# Patient Record
Sex: Female | Born: 1960 | Race: Black or African American | Hispanic: No | Marital: Single | Smoking: Never smoker
Health system: Southern US, Community
[De-identification: ages and names within clinical notes are randomized; demographics above are authoritative.]

## PROBLEM LIST (undated history)

## (undated) DIAGNOSIS — R590 Localized enlarged lymph nodes: Secondary | ICD-10-CM

## (undated) DIAGNOSIS — I10 Essential (primary) hypertension: Secondary | ICD-10-CM

## (undated) DIAGNOSIS — D649 Anemia, unspecified: Secondary | ICD-10-CM

## (undated) DIAGNOSIS — C539 Malignant neoplasm of cervix uteri, unspecified: Secondary | ICD-10-CM

## (undated) DIAGNOSIS — R87619 Unspecified abnormal cytological findings in specimens from cervix uteri: Secondary | ICD-10-CM

## (undated) DIAGNOSIS — E66813 Obesity, class 3: Secondary | ICD-10-CM

## (undated) DIAGNOSIS — D61818 Other pancytopenia: Secondary | ICD-10-CM

## (undated) DIAGNOSIS — Z973 Presence of spectacles and contact lenses: Secondary | ICD-10-CM

## (undated) DIAGNOSIS — R918 Other nonspecific abnormal finding of lung field: Secondary | ICD-10-CM

## (undated) HISTORY — DX: Morbid (severe) obesity due to excess calories: E66.01

## (undated) HISTORY — DX: Essential (primary) hypertension: I10

## (undated) HISTORY — DX: Unspecified abnormal cytological findings in specimens from cervix uteri: R87.619

## (undated) HISTORY — DX: Malignant neoplasm of cervix uteri, unspecified: C53.9

## (undated) HISTORY — PX: CERVICAL BIOPSY: SHX590

## (undated) HISTORY — DX: Obesity, class 3: E66.813

---

## 2000-06-03 ENCOUNTER — Encounter: Payer: Self-pay | Admitting: Emergency Medicine

## 2000-06-03 ENCOUNTER — Emergency Department (HOSPITAL_COMMUNITY): Admission: EM | Admit: 2000-06-03 | Discharge: 2000-06-04 | Payer: Self-pay | Admitting: Emergency Medicine

## 2000-09-18 ENCOUNTER — Emergency Department (HOSPITAL_COMMUNITY): Admission: EM | Admit: 2000-09-18 | Discharge: 2000-09-19 | Payer: Self-pay | Admitting: *Deleted

## 2000-09-26 ENCOUNTER — Emergency Department (HOSPITAL_COMMUNITY): Admission: EM | Admit: 2000-09-26 | Discharge: 2000-09-26 | Payer: Self-pay | Admitting: Emergency Medicine

## 2004-10-26 ENCOUNTER — Emergency Department (HOSPITAL_COMMUNITY): Admission: EM | Admit: 2004-10-26 | Discharge: 2004-10-26 | Payer: Self-pay | Admitting: Emergency Medicine

## 2005-06-05 ENCOUNTER — Emergency Department (HOSPITAL_COMMUNITY): Admission: EM | Admit: 2005-06-05 | Discharge: 2005-06-05 | Payer: Self-pay | Admitting: Emergency Medicine

## 2005-11-03 ENCOUNTER — Encounter: Admission: RE | Admit: 2005-11-03 | Discharge: 2005-11-03 | Payer: Self-pay | Admitting: Occupational Medicine

## 2008-03-25 ENCOUNTER — Emergency Department (HOSPITAL_COMMUNITY): Admission: EM | Admit: 2008-03-25 | Discharge: 2008-03-25 | Payer: Self-pay | Admitting: Family Medicine

## 2008-04-03 ENCOUNTER — Observation Stay (HOSPITAL_COMMUNITY): Admission: EM | Admit: 2008-04-03 | Discharge: 2008-04-04 | Payer: Self-pay | Admitting: Emergency Medicine

## 2008-04-21 ENCOUNTER — Emergency Department (HOSPITAL_COMMUNITY): Admission: EM | Admit: 2008-04-21 | Discharge: 2008-04-21 | Payer: Self-pay | Admitting: Emergency Medicine

## 2010-01-19 ENCOUNTER — Emergency Department (HOSPITAL_COMMUNITY): Admission: EM | Admit: 2010-01-19 | Discharge: 2010-01-19 | Payer: Self-pay | Admitting: Family Medicine

## 2010-09-25 ENCOUNTER — Emergency Department (HOSPITAL_COMMUNITY): Payer: Self-pay

## 2010-09-25 ENCOUNTER — Emergency Department (HOSPITAL_COMMUNITY)
Admission: EM | Admit: 2010-09-25 | Discharge: 2010-09-25 | Disposition: A | Discharge status: Home / Self Care | Payer: Self-pay | Attending: Emergency Medicine | Admitting: Emergency Medicine

## 2010-09-25 DIAGNOSIS — I10 Essential (primary) hypertension: Secondary | ICD-10-CM | POA: Insufficient documentation

## 2010-09-25 DIAGNOSIS — R51 Headache: Secondary | ICD-10-CM | POA: Insufficient documentation

## 2010-09-25 DIAGNOSIS — R0989 Other specified symptoms and signs involving the circulatory and respiratory systems: Secondary | ICD-10-CM | POA: Insufficient documentation

## 2010-09-25 DIAGNOSIS — R61 Generalized hyperhidrosis: Secondary | ICD-10-CM | POA: Insufficient documentation

## 2010-09-25 DIAGNOSIS — R079 Chest pain, unspecified: Secondary | ICD-10-CM | POA: Insufficient documentation

## 2010-09-25 DIAGNOSIS — H539 Unspecified visual disturbance: Secondary | ICD-10-CM | POA: Insufficient documentation

## 2010-09-25 DIAGNOSIS — R0609 Other forms of dyspnea: Secondary | ICD-10-CM | POA: Insufficient documentation

## 2010-09-25 LAB — DIFFERENTIAL
Eosinophils Absolute: 0.1 10*3/uL (ref 0.0–0.7)
Lymphs Abs: 1.2 10*3/uL (ref 0.7–4.0)
Monocytes Absolute: 0.3 10*3/uL (ref 0.1–1.0)
Monocytes Relative: 4 % (ref 3–12)
Neutro Abs: 4.6 10*3/uL (ref 1.7–7.7)
Neutrophils Relative %: 75 % (ref 43–77)

## 2010-09-25 LAB — CBC
HCT: 37.7 % (ref 36.0–46.0)
Hemoglobin: 12 g/dL (ref 12.0–15.0)
MCHC: 31.8 g/dL (ref 30.0–36.0)
MCV: 79.9 fL (ref 78.0–100.0)
RBC: 4.72 MIL/uL (ref 3.87–5.11)
RDW: 13.8 % (ref 11.5–15.5)
WBC: 6.2 10*3/uL (ref 4.0–10.5)

## 2010-09-25 LAB — POCT I-STAT, CHEM 8
Calcium, Ion: 1.06 mmol/L — ABNORMAL LOW (ref 1.12–1.32)
Glucose, Bld: 99 mg/dL (ref 70–99)
Potassium: 3.8 mEq/L (ref 3.5–5.1)
TCO2: 27 mmol/L (ref 0–100)

## 2010-09-25 LAB — POCT CARDIAC MARKERS: Troponin i, poc: <0.05 ng/mL (ref 0.00–0.09)

## 2010-09-25 LAB — D-DIMER, QUANTITATIVE: D-Dimer, Quant: 0.26 ug/mL-FEU (ref 0.00–0.48)

## 2010-10-15 NOTE — H&P (Signed)
Anne Harris, JORSTAD             ACCOUNT NO.:  [id number]   MEDICAL RECORD NO.:  [id number]          PATIENT TYPE:  INP   LOCATION:  4739                         FACILITY:  [facility]   PHYSICIAN:  Richard A. Alanda Amass, M.D.DATE OF BIRTH:  [birth year]   DATE OF ADMISSION:  04/03/2008  DATE OF DISCHARGE:  04/04/2008                              HISTORY & PHYSICAL   CHIEF COMPLIANT:  Left chest pain.   HISTORY OF PRESENT ILLNESS:  The patient is a 50 year old African  American female with no prior history of coronary artery disease or  chest pain.  She was admitted to the emergency room with 3 days of  intermittent left-sided chest pain.  She describes this as a dull ache.  There is some radiation down to her left arm.  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  She also admits to some associated shortness of breath.  In  the emergency room, her blood pressure initially was 190/110.  Nitroglycerin did seem to help in the emergency room.   PAST MEDICAL HISTORY:  Negative for hypertension, diabetes, or major  surgeries.  Her lipid status is unknown.   She has no medications at home and has no known drug allergies.   SOCIAL HISTORY:  She is single.  She has 6 grown children, she lives  alone.  She quit smoking 10 years ago.  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   FAMILY HISTORY:  Negative for coronary artery disease or diabetes.  Her  mother died of colon cancer.   REVIEW OF SYSTEMS:  Essentially unremarkable except for noted above.  There is no history of thyroid disease or kidney trouble.  There is no  history of GI bleeding or melena.  She has had no recent fever or  chills.  There is no history of past hypertension.  She occasionally  gets positional edema.  There is no orthopnea or PND.   PHYSICAL EXAMINATION:  VITAL SIGNS:  Blood pressure initially was  190/110 and at followup was 124/90,  pulse 88, temp 98.3.  GENERAL:  She is a well-developed, well-nourished African American  female in no acute distress.  HEENT:  Normocephalic, atraumatic.  Extraocular ocular movements are  intact.  Sclerae are anicteric.  She wears glasses.  NECK:  Without JVD or bruit.  Thyroids not enlarged.  CHEST:  Clear to auscultation and percussion.  CARDIAC:  Regular rate and rhythm without murmur, rub, or gallop.  Normal S1 and S2.  ABDOMEN:  Obese, nontender.  Bowel sounds are present.  EXTREMITIES:  Without edema.  Pulses are 2+ bilaterally.  NEUROLOGIC:  Grossly intact.  She is awake, alert, oriented, and  cooperative.  Moves all her extremities without obvious deficits.  SKIN:  Warm and dry.   LABS:  Sodium 143, potassium 3.0, BUN 4, creatinine of 0.97, and glucose  95.  White count 5.7, hemoglobin 12.3, hematocrit 37.8, and platelets  301.  Serum pregnancy test is negative.  Troponin is negative x1 in the  emergency room.  Chest x-ray shows no acute disease.  EKG shows sinus  rhythm without acute changes.   IMPRESSION:  1. Chest pain, worsening, with unstable angina.  2. Hypertension, previously undiagnosed.  3. Obesity.  4. Remote smoking.   PLAN:  The patient is admitted to telemetry.  She was seen by Dr.  Domingo Sep and myself in the emergency room.  She will be started on IV  heparin and nitrates as well as beta-blocker and ACE inhibitor-diuretic  combo for hypertension.  We will also add a statin pending her lipids.  Further evaluation will be pending her troponins.      Abelino Derrick, P.A.      Richard A. Alanda Amass, M.D.  Electronically Signed    LKK/MEDQ  D:  04/04/2008  T:  04/05/2008  Job:  [id number]

## 2010-10-15 NOTE — Discharge Summary (Signed)
[name], [name]             ACCOUNT NO.:  [id number]   MEDICAL RECORD NO.:  [id number]          PATIENT TYPE:  INP   LOCATION:  4739                         FACILITY:  [facility]   PHYSICIAN:  Richard A. Alanda Amass, M.D.DATE OF BIRTH:  [birth year]   DATE OF ADMISSION:  04/03/2008  DATE OF DISCHARGE:  04/04/2008                               DISCHARGE SUMMARY   DISCHARGE DIAGNOSES:  1. Chest pain, myocardial infarction ruled out.  2. Hypertension, new diagnosis, controlled at discharge.  3. Dyslipidemia.  4. Obesity.   HOSPITAL COURSE:  The patient is a pleasant 50 year old female with no  prior history of diabetes, hypertension, or chest pain presented to the  emergency room on April 03, 2008 with a couple of days of left-sided  chest discomfort.  She described a dull ache and pulling pain on her  left chest.  She said this was associated with some pain down her left  arm and shortness of breath with exertion.  She was admitted to the ER  for further evaluation.  Troponins were negative.  Lipids show  cholesterol 146, LDL 99, and HDL 31.  H. pylori is pending.  D-dimer was  negative.  TSH is 0.44.  The pregnancy test was negative.  Her EKG  showed no acute changes.  She was started on heparin, nitrates,  lisinopril HCTZ, aspirin, and PPI.  The next morning, she was feeling  improved.  She saw Dr. Alanda Amass on rounds.  He pretty much gave her the  option of cardiac catheterization versus coronary CT angiogram.  He did  not feel she was a good candidate for a Myoview because of her weight.  The patient elected to go with a CT angiogram.  This was done by Dr.  Alanda Amass and revealed a zero calcium score.  It was a difficult study  secondary to artifact in movement and contrast.  As far Dr. Alanda Amass  could tell the RCA was okay, the proximal circumflex and LAD were okay.  He feels the patient can be discharged.  If she has more pain, she will  need a catheterization.  Her CT scan  also showed multiple pulmonary  nodules and a followup CT scan is recommended for 3-6 months to rule out  sarcoid versus scarring in infection versus malignancy.   DISCHARGE MEDICATIONS:  Ibuprofen q.6 h. p.r.n., lisinopril HCTZ 10/12.5  once a day, Prilosec 20 mg a day, simvastatin 40 mg a day, aspirin 81 mg  a day, and Toprol-XL 25 mg once a day.   DISPOSITION:  The patient is discharged in stable condition and will  follow up with Dr. Alanda Amass in a couple of weeks.  She will call us if  she has more chest pain.      Abelino Derrick, P.A.      Richard A. Alanda Amass, M.D.  Electronically Signed    LKK/MEDQ  D:  04/04/2008  T:  04/05/2008  Job:  [id number]

## 2011-03-04 LAB — CBC
HCT: 33.3 — ABNORMAL LOW
HCT: 37.9
Hemoglobin: 11.7 — ABNORMAL LOW
MCHC: 32.5
MCHC: 32.8
MCHC: 35.2
MCV: 78.2
MCV: 79.1
MCV: 79.2
Platelets: 230
Platelets: 262
Platelets: 265
RDW: 14.8
RDW: 15
WBC: 8.6

## 2011-03-04 LAB — COMPREHENSIVE METABOLIC PANEL
ALT: 17
AST: 21
Albumin: 3.7
Alkaline Phosphatase: 64
Alkaline Phosphatase: 78
BUN: 4 — ABNORMAL LOW
CO2: 26
Chloride: 97
Creatinine, Ser: 0.86
GFR calc Af Amer: >60
Potassium: 2.9 — ABNORMAL LOW
Sodium: 137
Sodium: 144
Total Bilirubin: 0.6
Total Protein: 6.6
Total Protein: 7.2

## 2011-03-04 LAB — DIFFERENTIAL
Basophils Absolute: 0
Basophils Relative: 0
Eosinophils Absolute: 0.1
Eosinophils Relative: 2
Eosinophils Relative: 4
Lymphocytes Relative: 21
Lymphs Abs: 1.2
Monocytes Absolute: 0.3
Monocytes Absolute: 0.5
Monocytes Relative: 5
Monocytes Relative: 5
Neutro Abs: 4
Neutro Abs: 6.6

## 2011-03-04 LAB — BASIC METABOLIC PANEL
BUN: 6
CO2: 28
Calcium: 9.1
Creatinine, Ser: 0.97
GFR calc non Af Amer: >60
Glucose, Bld: 87
Potassium: 3 — ABNORMAL LOW
Sodium: 143
Sodium: 145

## 2011-03-04 LAB — LIPID PANEL
LDL Cholesterol: 99
Total CHOL/HDL Ratio: 4.7
Triglycerides: 82

## 2011-03-04 LAB — CARDIAC PANEL(CRET KIN+CKTOT+MB+TROPI)
CK, MB: 0.9
Relative Index: 0.6
Total CK: 155
Troponin I: 0.02
Troponin I: <0.01

## 2011-03-04 LAB — HEPARIN LEVEL (UNFRACTIONATED)
Heparin Unfractionated: 0.32
Heparin Unfractionated: 1.53 — ABNORMAL HIGH
Heparin Unfractionated: <0.1 — ABNORMAL LOW

## 2011-03-04 LAB — TROPONIN I: Troponin I: 0.01

## 2011-03-04 LAB — CK TOTAL AND CKMB (NOT AT ARMC)
CK, MB: 1.5
Relative Index: 0.6

## 2011-03-04 LAB — URINE MICROSCOPIC-ADD ON

## 2011-03-04 LAB — POCT CARDIAC MARKERS
Myoglobin, poc: 72.3
Troponin i, poc: <0.05

## 2011-03-04 LAB — URINALYSIS, ROUTINE W REFLEX MICROSCOPIC
Glucose, UA: NEGATIVE
Protein, ur: 30 — AB

## 2011-03-04 LAB — PROTIME-INR: Prothrombin Time: 13.8

## 2011-03-04 LAB — MAGNESIUM: Magnesium: 2

## 2011-03-04 LAB — TSH: TSH: 0.454

## 2012-02-16 ENCOUNTER — Encounter (HOSPITAL_COMMUNITY): Payer: Self-pay | Admitting: Emergency Medicine

## 2012-02-16 ENCOUNTER — Emergency Department (INDEPENDENT_AMBULATORY_CARE_PROVIDER_SITE_OTHER)
Admission: EM | Admit: 2012-02-16 | Discharge: 2012-02-16 | Disposition: A | Discharge status: Home / Self Care | Payer: Self-pay | Source: Home / Self Care | Attending: Emergency Medicine | Admitting: Emergency Medicine

## 2012-02-16 DIAGNOSIS — W57XXXA Bitten or stung by nonvenomous insect and other nonvenomous arthropods, initial encounter: Secondary | ICD-10-CM

## 2012-02-16 DIAGNOSIS — T148XXA Other injury of unspecified body region, initial encounter: Secondary | ICD-10-CM

## 2012-02-16 DIAGNOSIS — L0390 Cellulitis, unspecified: Secondary | ICD-10-CM

## 2012-02-16 DIAGNOSIS — L0291 Cutaneous abscess, unspecified: Secondary | ICD-10-CM

## 2012-02-16 MED ORDER — TRIAMCINOLONE ACETONIDE 0.1 % EX CREA: TOPICAL_CREAM | Freq: Two times a day (BID) | CUTANEOUS | Status: DC

## 2012-02-16 MED ORDER — CEPHALEXIN 500 MG PO CAPS: 500.0000 mg | ORAL_CAPSULE | Freq: Three times a day (TID) | ORAL | Status: DC

## 2012-02-16 NOTE — ED Notes (Signed)
C/o of mosquito bites that are itchy- last dose of benadryl at 2000 yesterday

## 2012-02-16 NOTE — ED Provider Notes (Signed)
History     CSN: [id number]  Arrival date & time 02/16/12  0910   First MD Initiated Contact with Patient 02/16/12 1036      Chief Complaint  Patient presents with  . Insect Bite    (Consider location/radiation/quality/duration/timing/severity/associated sxs/prior treatment) The history is provided by the patient.  patient reports mosquito bites while sitting on front porch at friends house one week ago.  States continued symptoms of itching and tenderness.  Has taken benadryl for symptoms, no topical treatment.  Has never had this reaction before.    History reviewed. No pertinent past medical history.  History reviewed. No pertinent past surgical history.  No family history on file.  History  Substance Use Topics  . Smoking status: Never Smoker   . Smokeless tobacco: Not on file  . Alcohol Use: Yes     social    OB History    Grav Para Term Preterm Abortions TAB SAB Ect Mult Living                  Review of Systems  Constitutional: Negative.   Respiratory: Negative.   Cardiovascular: Negative.   Musculoskeletal: Negative.   Skin: Positive for rash and wound.    Allergies  Review of patient's allergies indicates no known allergies.  Home Medications   Current Outpatient Rx  Name Route Sig Dispense Refill  . CEPHALEXIN 500 MG PO CAPS Oral Take 1 capsule (500 mg total) by mouth 3 (three) times daily. 30 capsule 0  . TRIAMCINOLONE ACETONIDE 0.1 % EX CREA Topical Apply topically 2 (two) times daily. 30 g 0    BP 159/99  Pulse 84  Temp 97.8 F (36.6 C) (Oral)  Resp 16  SpO2 97%  Physical Exam  Nursing note and vitals reviewed. Constitutional: She is oriented to person, place, and time. Vital signs are normal. She appears well-developed and well-nourished. She is active and cooperative.  HENT:  Head: Normocephalic.  Eyes: Conjunctivae normal are normal. Pupils are equal, round, and reactive to light. No scleral icterus.  Neck: Trachea normal. Neck  supple.  Cardiovascular: Normal rate and regular rhythm.   Pulmonary/Chest: Effort normal and breath sounds normal.  Neurological: She is alert and oriented to person, place, and time. No cranial nerve deficit or sensory deficit.  Skin: Skin is warm and dry. Lesion and rash noted. Rash is urticarial.     Psychiatric: She has a normal mood and affect. Her speech is normal and behavior is normal. Judgment and thought content normal. Cognition and memory are normal.    ED Course  Procedures (including critical care time)  Labs Reviewed - No data to display No results found.   1. Insect bite   2. Cellulitis       MDM  Take medication as prescribed.  Continue benadryl.  Follow up as needed.  Call health connect to find a primary care provider to address your elevated blood pressure.          Johnsie Kindred, NP 02/16/12 1045

## 2012-02-19 NOTE — ED Provider Notes (Signed)
Medical screening examination/treatment/procedure(s) were performed by non-physician practitioner and as supervising physician I was immediately available for consultation/collaboration.  Karlee Staff M. MD   Rajvir Ernster M Shahla Betsill, MD 02/19/12 1219 

## 2017-03-11 ENCOUNTER — Emergency Department (HOSPITAL_COMMUNITY)
Admission: EM | Admit: 2017-03-11 | Discharge: 2017-03-11 | Disposition: A | Discharge status: Home / Self Care | Payer: Self-pay | Attending: Emergency Medicine | Admitting: Emergency Medicine

## 2017-03-11 ENCOUNTER — Emergency Department (HOSPITAL_COMMUNITY): Payer: Self-pay

## 2017-03-11 ENCOUNTER — Encounter (HOSPITAL_COMMUNITY): Payer: Self-pay | Admitting: Emergency Medicine

## 2017-03-11 DIAGNOSIS — I10 Essential (primary) hypertension: Secondary | ICD-10-CM | POA: Insufficient documentation

## 2017-03-11 DIAGNOSIS — R079 Chest pain, unspecified: Secondary | ICD-10-CM | POA: Insufficient documentation

## 2017-03-11 LAB — BASIC METABOLIC PANEL
Anion gap: 8 (ref 5–15)
BUN: 8 mg/dL (ref 6–20)
CALCIUM: 8.8 mg/dL — AB (ref 8.9–10.3)
CO2: 28 mmol/L (ref 22–32)
Chloride: 104 mmol/L (ref 101–111)
Creatinine, Ser: 0.84 mg/dL (ref 0.44–1.00)
GFR calc non Af Amer: >60 mL/min (ref >60)
Glucose, Bld: 92 mg/dL (ref 65–99)
Potassium: 3.9 mmol/L (ref 3.5–5.1)
SODIUM: 140 mmol/L (ref 135–145)

## 2017-03-11 LAB — CBC
HCT: 36.9 % (ref 36.0–46.0)
Hemoglobin: 11.3 g/dL — ABNORMAL LOW (ref 12.0–15.0)
MCH: 24.9 pg — ABNORMAL LOW (ref 26.0–34.0)
MCHC: 30.6 g/dL (ref 30.0–36.0)
MCV: 81.3 fL (ref 78.0–100.0)
PLATELETS: 242 10*3/uL (ref 150–400)
RBC: 4.54 MIL/uL (ref 3.87–5.11)
RDW: 14.5 % (ref 11.5–15.5)
WBC: 8.8 10*3/uL (ref 4.0–10.5)

## 2017-03-11 LAB — I-STAT TROPONIN, ED: TROPONIN I, POC: 0 ng/mL (ref 0.00–0.08)

## 2017-03-11 LAB — TROPONIN I

## 2017-03-11 MED ORDER — LISINOPRIL 10 MG PO TABS: 10.0000 mg | ORAL_TABLET | Freq: Every day | ORAL | 1 refills | Status: DC

## 2017-03-11 MED ORDER — IBUPROFEN 800 MG PO TABS: 800.0000 mg | ORAL_TABLET | Freq: Three times a day (TID) | ORAL | 0 refills | Status: DC

## 2017-03-11 MED ORDER — LISINOPRIL 10 MG PO TABS
10.0000 mg | ORAL_TABLET | Freq: Once | ORAL | Status: AC
  Administered 2017-03-11: 10 mg via ORAL
  Filled 2017-03-11: qty 1

## 2017-03-11 NOTE — ED Provider Notes (Signed)
[facility] DEPT Provider Note   CSN: [id number] Arrival date & time: 03/11/17  1255     History   Chief Complaint Chief Complaint  Patient presents with  . Chest Pain    HPI Anne Harris is a 56 y.o. female.  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 She had one other person were helping this person today and as they were helping to lift her, the patient developed acute onset of left-sided chest pain and left arm pain. This is worse with movement, worse with palpation of the chest, not associated with shortness of breath swelling of the legs or any other symptoms. While the patient states that she never exercises and has not seen a doctor in 25 years she also has never had chest pain or shortness of breath. She takes no daily medications, does not smoke cigarettes, drink alcohol or use any illicit drugs. Currently her symptoms are mild, rated as 2 out of 10, they have gradually improved without any intervention.  History reviewed. No pertinent past medical history.  There are no active problems to display for this patient.   History reviewed. No pertinent surgical history.  OB History    No data available       Home Medications    Prior to Admission medications   Medication Sig Start Date End Date Taking? Authorizing Provider  cephALEXin (KEFLEX) 500 MG capsule Take 1 capsule (500 mg total) by mouth 3 (three) times daily. Patient not taking: Reported on 03/11/2017 02/16/12   Awilda Metro, NP  ibuprofen (ADVIL,MOTRIN) 800 MG tablet Take 1 tablet (800 mg total) by mouth 3 (three) times daily. 03/11/17   Noemi Chapel, MD  lisinopril (PRINIVIL,ZESTRIL) 10 MG tablet Take 1 tablet (10 mg total) by mouth daily. 03/11/17   Noemi Chapel, MD  triamcinolone cream (KENALOG) 0.1 % Apply topically 2 (two)  times daily. Patient not taking: Reported on 03/11/2017 02/16/12   Awilda Metro, NP    Family History No family history on file.  Social History Social History  Substance Use Topics  . Smoking status: Never Smoker  . Smokeless tobacco: Not on file  . Alcohol use Yes     Comment: social     Allergies   Patient has no known allergies.   Review of Systems Review of Systems  All other systems reviewed and are negative.    Physical Exam Updated Vital Signs BP (!) 180/88   Pulse 73   Temp 98.2 F (36.8 C) (Oral)   Resp 18   SpO2 100%   Physical Exam  Constitutional: She appears well-developed and well-nourished. No distress.  HENT:  Head: Normocephalic and atraumatic.  Mouth/Throat: Oropharynx is clear and moist. No oropharyngeal exudate.  Eyes: Pupils are equal, round, and reactive to light. Conjunctivae and EOM are normal. Right eye exhibits no discharge. Left eye exhibits no discharge. No scleral icterus.  Neck: Normal range of motion. Neck supple. No JVD present. No thyromegaly present.  Cardiovascular: Normal rate, regular rhythm, normal heart sounds and intact distal pulses.  Exam reveals no gallop and no friction rub.   No murmur heard. Pulmonary/Chest: Effort normal and breath sounds normal. No respiratory distress. She has no wheezes. She has no rales. She exhibits tenderness ( ttp over the L side of the chest  wall).  Abdominal: Soft. Bowel sounds are normal. She exhibits no distension and no mass. There is no tenderness.  Musculoskeletal: Normal range of motion. She exhibits no edema or tenderness.  Lymphadenopathy:    She has no cervical adenopathy.  Neurological: She is alert. Coordination normal.  Skin: Skin is warm and dry. No rash noted. No erythema.  Psychiatric: She has a normal mood and affect. Her behavior is normal.  Nursing note and vitals reviewed.    ED Treatments / Results  Labs (all labs ordered are listed, but only abnormal results  are displayed) Labs Reviewed  BASIC METABOLIC PANEL - Abnormal; Notable for the following:       Result Value   Calcium 8.8 (*)    All other components within normal limits  CBC - Abnormal; Notable for the following:    Hemoglobin 11.3 (*)    MCH 24.9 (*)    All other components within normal limits  TROPONIN I  I-STAT TROPONIN, ED    Radiology Dg Chest 2 View  Result Date: 03/11/2017 CLINICAL DATA:  Left-sided chest pain radiating to the left arm. EXAM: CHEST  2 VIEW COMPARISON:  09/25/2010 FINDINGS: The cardiomediastinal silhouette is within normal limits. The lungs are well inflated and clear. There is no evidence of pleural effusion or pneumothorax. No acute osseous abnormality is identified. IMPRESSION: No active cardiopulmonary disease. Electronically Signed   By: Logan Bores M.D.   On: 03/11/2017 15:18   ED ECG REPORT  I personally interpreted this EKG   Date: 03/11/2017   Rate: 92  Rhythm: normal sinus rhythm  QRS Axis: normal  Intervals: normal  ST/T Wave abnormalities: normal  Conduction Disutrbances:none  Narrative Interpretation:   Old EKG Reviewed: unchanged   Procedures Procedures (including critical care time)  Medications Ordered in ED Medications  lisinopril (PRINIVIL,ZESTRIL) tablet 10 mg (10 mg Oral Given 03/11/17 2049)     Initial Impression / Assessment and Plan / ED Course  I have reviewed the triage vital signs and the nursing notes.  Pertinent labs & imaging results that were available during my care of the patient were reviewed by me and considered in my medical decision making (see chart for details).     Normal ECG Reproducible pain Low Heart Pathway score 2 trop and hom Has hypetension Had long talk  With patient about use of lisinopril given BP of 180/90 - she is agreeable. She is aware of side effects including angioedema - agreeable with this knowledge.  Doubt PE or Dissection - no cough or fever to suggest pna and no rub or ekg  findings of pericarditis.  2nd trop neg Pt stable for d/c.  Final Clinical Impressions(s) / ED Diagnoses   Final diagnoses:  Left sided chest pain  Essential hypertension    New Prescriptions New Prescriptions   IBUPROFEN (ADVIL,MOTRIN) 800 MG TABLET    Take 1 tablet (800 mg total) by mouth 3 (three) times daily.   LISINOPRIL (PRINIVIL,ZESTRIL) 10 MG TABLET    Take 1 tablet (10 mg total) by mouth daily.     Noemi Chapel, MD 03/11/17 [id number]

## 2017-03-11 NOTE — Discharge Instructions (Signed)
Lisinopril 10mg  by mouth daily Motrin as needed for pain ER for worsening symptoms.  You MUST have follow up for blood pressure check and to have a formal evaluation of your blood work, cholesterol and blood pressure.

## 2017-03-11 NOTE — ED Triage Notes (Signed)
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 Pt still has the pain 3/10.

## 2019-10-25 ENCOUNTER — Encounter (HOSPITAL_COMMUNITY): Payer: Self-pay

## 2019-10-25 ENCOUNTER — Other Ambulatory Visit: Payer: Self-pay

## 2019-10-25 ENCOUNTER — Emergency Department (HOSPITAL_COMMUNITY)
Admission: EM | Admit: 2019-10-25 | Discharge: 2019-10-25 | Disposition: A | Discharge status: Home / Self Care | Payer: Self-pay | Attending: Emergency Medicine | Admitting: Emergency Medicine

## 2019-10-25 DIAGNOSIS — N950 Postmenopausal bleeding: Secondary | ICD-10-CM | POA: Insufficient documentation

## 2019-10-25 DIAGNOSIS — R102 Pelvic and perineal pain: Secondary | ICD-10-CM | POA: Insufficient documentation

## 2019-10-25 DIAGNOSIS — R300 Dysuria: Secondary | ICD-10-CM | POA: Insufficient documentation

## 2019-10-25 LAB — COMPREHENSIVE METABOLIC PANEL
ALT: 14 U/L (ref 0–44)
AST: 20 U/L (ref 15–41)
Albumin: 3.3 g/dL — ABNORMAL LOW (ref 3.5–5.0)
Alkaline Phosphatase: 70 U/L (ref 38–126)
Anion gap: 10 (ref 5–15)
BUN: 7 mg/dL (ref 6–20)
CO2: 25 mmol/L (ref 22–32)
Calcium: 9 mg/dL (ref 8.9–10.3)
Chloride: 104 mmol/L (ref 98–111)
Creatinine, Ser: 0.8 mg/dL (ref 0.44–1.00)
GFR calc Af Amer: >60 mL/min (ref >60)
GFR calc non Af Amer: >60 mL/min (ref >60)
Glucose, Bld: 111 mg/dL — ABNORMAL HIGH (ref 70–99)
Potassium: 3.6 mmol/L (ref 3.5–5.1)
Sodium: 139 mmol/L (ref 135–145)
Total Bilirubin: 0.7 mg/dL (ref 0.3–1.2)
Total Protein: 7.5 g/dL (ref 6.5–8.1)

## 2019-10-25 LAB — URINALYSIS, ROUTINE W REFLEX MICROSCOPIC
Bilirubin Urine: NEGATIVE
Glucose, UA: NEGATIVE mg/dL
Ketones, ur: 20 mg/dL — AB
Nitrite: NEGATIVE
Protein, ur: 100 mg/dL — AB
RBC / HPF: >50 RBC/hpf — ABNORMAL HIGH (ref 0–5)
Specific Gravity, Urine: 1.026 (ref 1.005–1.030)
pH: 6 (ref 5.0–8.0)

## 2019-10-25 LAB — CBC
HCT: 35.4 % — ABNORMAL LOW (ref 36.0–46.0)
Hemoglobin: 10.7 g/dL — ABNORMAL LOW (ref 12.0–15.0)
MCH: 24.6 pg — ABNORMAL LOW (ref 26.0–34.0)
MCHC: 30.2 g/dL (ref 30.0–36.0)
MCV: 81.4 fL (ref 80.0–100.0)
Platelets: 317 10*3/uL (ref 150–400)
RBC: 4.35 MIL/uL (ref 3.87–5.11)
RDW: 13.8 % (ref 11.5–15.5)
WBC: 8.9 10*3/uL (ref 4.0–10.5)
nRBC: 0 % (ref 0.0–0.2)

## 2019-10-25 LAB — WET PREP, GENITAL
Clue Cells Wet Prep HPF POC: NONE SEEN
Sperm: NONE SEEN
Trich, Wet Prep: NONE SEEN
Yeast Wet Prep HPF POC: NONE SEEN

## 2019-10-25 LAB — LIPASE, BLOOD: Lipase: 24 U/L (ref 11–51)

## 2019-10-25 MED ORDER — IBUPROFEN 400 MG PO TABS
600.0000 mg | ORAL_TABLET | Freq: Once | ORAL | Status: AC
  Administered 2019-10-25: 17:00:00 600 mg via ORAL
  Filled 2019-10-25: qty 1

## 2019-10-25 NOTE — ED Notes (Signed)
Pt discharge instructions reviewed with the patient. The patient verbalized understanding of instructions. Pt discharged. 

## 2019-10-25 NOTE — ED Provider Notes (Signed)
[facility] EMERGENCY DEPARTMENT Provider Note   CSN: [id number] Arrival date & time: 10/25/19  [id number]     History Chief Complaint  Patient presents with  . Abdominal Pain  . Vaginal Bleeding    Anne Harris is a 59 y.o. female.  HPI   Pt is a 59 y/o F who presents to the ED today for eval of lower abd pain that started yesterday. Pain has been intermittent. States pain feels like cramps and she rates pain 5/10. States that she also started having vaginal bleeding yesterday that started out heavily with clots but has eased off some since it started. States that she has gone through about 5-6 pads in the last 24 hours. Denies associated fevers, nausea, vomiting, diarrhea, constipation, urinary frequency, or hematuria.  She reports associated dysuria. She has not tried anything for her sxs.  States she has not had a menstrual cycle since she was 41.   History reviewed. No pertinent past medical history.  There are no problems to display for this patient.   History reviewed. No pertinent surgical history.   OB History   No obstetric history on file.     No family history on file.  Social History   Tobacco Use  . Smoking status: Never Smoker  Substance Use Topics  . Alcohol use: Yes    Comment: social  . Drug use: Not on file    Home Medications Prior to Admission medications   Medication Sig Start Date End Date Taking? Authorizing Provider  cephALEXin (KEFLEX) 500 MG capsule Take 1 capsule (500 mg total) by mouth 3 (three) times daily. Patient not taking: Reported on 03/11/2017 02/16/12   Awilda Metro, NP  ibuprofen (ADVIL,MOTRIN) 800 MG tablet Take 1 tablet (800 mg total) by mouth 3 (three) times daily. 03/11/17   Noemi Chapel, MD  lisinopril (PRINIVIL,ZESTRIL) 10 MG tablet Take 1 tablet (10 mg total) by mouth daily. 03/11/17   Noemi Chapel, MD  triamcinolone cream (KENALOG) 0.1 % Apply topically 2 (two) times daily. Patient not  taking: Reported on 03/11/2017 02/16/12   Awilda Metro, NP    Allergies    Patient has no known allergies.  Review of Systems   Review of Systems  Constitutional: Negative for fever.  HENT: Negative for ear pain and sore throat.   Eyes: Negative for visual disturbance.  Respiratory: Negative for cough and shortness of breath.   Cardiovascular: Negative for chest pain.  Gastrointestinal: Positive for abdominal pain. Negative for constipation, diarrhea, nausea and vomiting.  Genitourinary: Positive for dysuria, pelvic pain and vaginal bleeding. Negative for frequency, hematuria and vaginal discharge.  Musculoskeletal: Negative for back pain.  Skin: Negative for rash.  Neurological: Negative for headaches.  All other systems reviewed and are negative.   Physical Exam Updated Vital Signs BP (!) 141/93 (BP Location: Right Arm)   Pulse 79   Temp 97.8 F (36.6 C) (Oral)   Resp 16   Ht 5\' 4"  (1.626 m)   Wt 112.5 kg   SpO2 99%   BMI 42.57 kg/m   Physical Exam Vitals and nursing note reviewed.  Constitutional:      General: She is not in acute distress.    Appearance: She is well-developed.  HENT:     Head: Normocephalic and atraumatic.  Eyes:     Conjunctiva/sclera: Conjunctivae normal.  Cardiovascular:     Rate and Rhythm: Normal rate and regular rhythm.     Heart sounds: No murmur.  Pulmonary:     Effort: Pulmonary effort is normal. No respiratory distress.     Breath sounds: Normal breath sounds.  Abdominal:     Palpations: Abdomen is soft.     Comments: Mild lower abd/suprapubic ttp without rebound or guarding  Genitourinary:    Comments: Exam performed by Rodney Booze,  exam chaperoned Date: 10/25/2019 Pelvic exam: normal external genitalia without evidence of trauma. VULVA: normal appearing vulva with no masses, tenderness or lesion. VAGINA: normal appearing vagina with normal color and discharge, no lesions. Mild amount of blood in the vaginal vault.    CERVIX: abnormal appearing cervix that is friable with a mild amount of bleeding, cervical motion tenderness absent, cervical os is unable to be visualized, vaginal discharge - none, Wet prep and DNA probe for chlamydia and GC obtained.   ADNEXA: normal adnexa in size, nontender and no masses UTERUS: uterus feels somewhat firm and is mildly TTP. Musculoskeletal:     Cervical back: Neck supple.  Skin:    General: Skin is warm and dry.  Neurological:     Mental Status: She is alert.     ED Results / Procedures / Treatments   Labs (all labs ordered are listed, but only abnormal results are displayed) Labs Reviewed  WET PREP, GENITAL - Abnormal; Notable for the following components:      Result Value   WBC, Wet Prep HPF POC MODERATE (*)    All other components within normal limits  COMPREHENSIVE METABOLIC PANEL - Abnormal; Notable for the following components:   Glucose, Bld 111 (*)    Albumin 3.3 (*)    All other components within normal limits  CBC - Abnormal; Notable for the following components:   Hemoglobin 10.7 (*)    HCT 35.4 (*)    MCH 24.6 (*)    All other components within normal limits  URINALYSIS, ROUTINE W REFLEX MICROSCOPIC - Abnormal; Notable for the following components:   APPearance HAZY (*)    Hgb urine dipstick LARGE (*)    Ketones, ur 20 (*)    Protein, ur 100 (*)    Leukocytes,Ua TRACE (*)    RBC / HPF >50 (*)    Bacteria, UA RARE (*)    All other components within normal limits  LIPASE, BLOOD  GC/CHLAMYDIA PROBE AMP (Jette) NOT AT [facility]    EKG None  Radiology No results found.  Procedures Procedures (including critical care time)  Medications Ordered in ED Medications  ibuprofen (ADVIL) tablet 600 mg (600 mg Oral Given 10/25/19 1648)    ED Course  I have reviewed the triage vital signs and the nursing notes.  Pertinent labs & imaging results that were available during my care of the patient were reviewed by me and considered in my  medical decision making (see chart for details).    MDM Rules/Calculators/A&P                      59 year old female presenting for evaluation of vaginal bleeding that started yesterday.  Patient is postmenopausal since the age of 59 years old.    On exam she has some mild amount of bleeding in the vaginal vault with some bleeding from the cervix.  The tissue on the cervix is abnormal in appearance and the cervical os is unable to be visualized.  The uterus also feels abnormal on exam.  Reviewed/interpreted lab She does have some mild anemia on her CBC but this does appear consistent  with her baseline The remainder of her labs are reassuring. Urinalysis does not suggest of infection though will culture.  4:11 PM Discussed case with Rosendo Gros with social work. She attempted to contact [facility] but the providers are in clinic today.  She states she will contact the clinic tomorrow to schedule an appointment and will also follow-up with the patient to let her know of the appointment.  I reassessed the patient.  Discussed concern for possible endometrial cancer and need for follow-up with OB/GYN.  Discussed plan that social work will contact her tomorrow.  She is hemodynamically stable at this time and there is no indication to give blood products or initiate medications to stop the bleeding at this time.  Advised on specific return precautions.  She voices understanding of the plan and reasons to return.  All Questions answered.  Patient stable for discharge.  Final Clinical Impression(s) / ED Diagnoses Final diagnoses:  Post-menopausal bleeding    Rx / DC Orders ED Discharge Orders    None       Bishop Dublin 10/25/19 1813    Gareth Morgan, MD 10/26/19 1032

## 2019-10-25 NOTE — Discharge Instructions (Signed)
A culture was sent of your urine today to determine if there is any bacterial growth. If the results of the culture are positive and you require an antibiotic or a change of your prescribed antibiotic you will be contacted by the hospital. If the results are negative you will not be contacted.  The social worker should call you tomorrow morning to discuss and appointment with the [facility] clinic.   Please return to the emergency department for any new or worsening symptoms including any severe bleeding, fevers, or worsening pain.

## 2019-10-25 NOTE — ED Triage Notes (Signed)
Pt reports 2 days of lower abd pain and vaginal bleeding, pt noticed some clots yesterday. States she hasnt had a period since she was 59 years old.

## 2019-10-26 ENCOUNTER — Telehealth: Payer: Self-pay

## 2019-10-26 ENCOUNTER — Telehealth: Payer: Self-pay | Admitting: *Deleted

## 2019-10-26 LAB — GC/CHLAMYDIA PROBE AMP (~~LOC~~) NOT AT ARMC
Chlamydia: NEGATIVE
Comment: NEGATIVE
Comment: NORMAL
Neisseria Gonorrhea: NEGATIVE

## 2019-10-26 NOTE — Telephone Encounter (Signed)
Telephoned patient at home number. Left voice message with BCCCP contact information. 

## 2019-10-26 NOTE — Telephone Encounter (Signed)
RNCM spoke with Nevin Bloodgood of Breast and Cervical Cancer Control [facility]) regarding follow-up appointment for pt.  Nevin Bloodgood will reach out to pt and set up appointment for tomorrow.

## 2019-10-27 LAB — URINE CULTURE

## 2019-11-03 ENCOUNTER — Emergency Department (HOSPITAL_COMMUNITY)
Admission: EM | Admit: 2019-11-03 | Discharge: 2019-11-03 | Disposition: A | Discharge status: Home / Self Care | Payer: BC Managed Care – PPO | Attending: Emergency Medicine | Admitting: Emergency Medicine

## 2019-11-03 ENCOUNTER — Other Ambulatory Visit: Payer: Self-pay

## 2019-11-03 ENCOUNTER — Encounter (HOSPITAL_COMMUNITY): Payer: Self-pay | Admitting: Emergency Medicine

## 2019-11-03 DIAGNOSIS — N390 Urinary tract infection, site not specified: Secondary | ICD-10-CM | POA: Insufficient documentation

## 2019-11-03 DIAGNOSIS — N938 Other specified abnormal uterine and vaginal bleeding: Secondary | ICD-10-CM | POA: Diagnosis not present

## 2019-11-03 DIAGNOSIS — R030 Elevated blood-pressure reading, without diagnosis of hypertension: Secondary | ICD-10-CM | POA: Insufficient documentation

## 2019-11-03 DIAGNOSIS — R319 Hematuria, unspecified: Secondary | ICD-10-CM | POA: Diagnosis not present

## 2019-11-03 DIAGNOSIS — D649 Anemia, unspecified: Secondary | ICD-10-CM | POA: Diagnosis not present

## 2019-11-03 DIAGNOSIS — R1030 Lower abdominal pain, unspecified: Secondary | ICD-10-CM | POA: Diagnosis present

## 2019-11-03 DIAGNOSIS — N939 Abnormal uterine and vaginal bleeding, unspecified: Secondary | ICD-10-CM

## 2019-11-03 LAB — URINALYSIS, ROUTINE W REFLEX MICROSCOPIC
Bilirubin Urine: NEGATIVE
Glucose, UA: NEGATIVE mg/dL
Ketones, ur: 20 mg/dL — AB
Nitrite: NEGATIVE
Protein, ur: 100 mg/dL — AB
RBC / HPF: >50 RBC/hpf — ABNORMAL HIGH (ref 0–5)
Specific Gravity, Urine: 1.023 (ref 1.005–1.030)
WBC, UA: >50 WBC/hpf — ABNORMAL HIGH (ref 0–5)
pH: 5 (ref 5.0–8.0)

## 2019-11-03 LAB — CBC
HCT: 32.1 % — ABNORMAL LOW (ref 36.0–46.0)
Hemoglobin: 9.9 g/dL — ABNORMAL LOW (ref 12.0–15.0)
MCH: 24.8 pg — ABNORMAL LOW (ref 26.0–34.0)
MCHC: 30.8 g/dL (ref 30.0–36.0)
MCV: 80.5 fL (ref 80.0–100.0)
Platelets: 325 10*3/uL (ref 150–400)
RBC: 3.99 MIL/uL (ref 3.87–5.11)
RDW: 14 % (ref 11.5–15.5)
WBC: 9.4 10*3/uL (ref 4.0–10.5)
nRBC: 0 % (ref 0.0–0.2)

## 2019-11-03 LAB — COMPREHENSIVE METABOLIC PANEL
ALT: 12 U/L (ref 0–44)
AST: 18 U/L (ref 15–41)
Albumin: 3.5 g/dL (ref 3.5–5.0)
Alkaline Phosphatase: 62 U/L (ref 38–126)
Anion gap: 12 (ref 5–15)
BUN: <5 mg/dL — ABNORMAL LOW (ref 6–20)
CO2: 24 mmol/L (ref 22–32)
Calcium: 8.9 mg/dL (ref 8.9–10.3)
Chloride: 105 mmol/L (ref 98–111)
Creatinine, Ser: 0.75 mg/dL (ref 0.44–1.00)
GFR calc Af Amer: >60 mL/min (ref >60)
GFR calc non Af Amer: >60 mL/min (ref >60)
Glucose, Bld: 104 mg/dL — ABNORMAL HIGH (ref 70–99)
Potassium: 3.7 mmol/L (ref 3.5–5.1)
Sodium: 141 mmol/L (ref 135–145)
Total Bilirubin: 0.6 mg/dL (ref 0.3–1.2)
Total Protein: 7.4 g/dL (ref 6.5–8.1)

## 2019-11-03 LAB — LIPASE, BLOOD: Lipase: 22 U/L (ref 11–51)

## 2019-11-03 MED ORDER — CEPHALEXIN 500 MG PO CAPS: 500.0000 mg | ORAL_CAPSULE | Freq: Two times a day (BID) | ORAL | 0 refills | Status: AC

## 2019-11-03 MED ORDER — DOCUSATE SODIUM 100 MG PO CAPS: 100.0000 mg | ORAL_CAPSULE | Freq: Two times a day (BID) | ORAL | 0 refills | Status: DC | PRN

## 2019-11-03 MED ORDER — KETOROLAC TROMETHAMINE 15 MG/ML IJ SOLN
15.0000 mg | Freq: Once | INTRAMUSCULAR | Status: AC
  Administered 2019-11-03: 15 mg via INTRAMUSCULAR
  Filled 2019-11-03: qty 1

## 2019-11-03 MED ORDER — NAPROXEN 500 MG PO TABS: 500.0000 mg | ORAL_TABLET | Freq: Two times a day (BID) | ORAL | 0 refills | Status: DC

## 2019-11-03 MED ORDER — SODIUM CHLORIDE 0.9% FLUSH: 3.0000 mL | Freq: Once | INTRAVENOUS | Status: DC

## 2019-11-03 MED ORDER — FERROUS SULFATE 325 (65 FE) MG PO TABS: 325.0000 mg | ORAL_TABLET | Freq: Every day | ORAL | 0 refills | Status: DC

## 2019-11-03 NOTE — Discharge Instructions (Signed)
Your urine showed an infection, so take the Keflex as prescribed.  Take the entire course, even if your symptoms improve. Take naproxen 2 times a day with meals.  Do not take other anti-inflammatories at the same time (Advil, Motrin, ibuprofen, Aleve). You may supplement with Tylenol if you need further pain control. Use heating pads if this helps control your pain. Take the iron pills daily.  Sometimes this may change the color of your stool or make you constipated.  If you start to have constipation, increase your water intake and take the stool softener as prescribed. Follow-up with your OB/GYN, Dr. Roselie Awkward, as needed for further evaluation of your symptoms. Return to the emergency room if you develop fevers, severe worsening pain, dizziness/lightheadedness, or any new, worsening, or concerning symptoms.  Your blood pressure was elevated today.  Felt this may be due to pain/stress, it is important that you follow-up with a primary care doctor for recheck.  There is information for clinic listed below, call to set up a follow-up appointment.

## 2019-11-03 NOTE — ED Notes (Signed)
Patient verbalized understanding of discharge instruction.

## 2019-11-03 NOTE — ED Triage Notes (Signed)
Pt reports for a week had lower abd pains and vaginal bleeding. Went to [facility] ED and states they couldn't tell her what the cause was.

## 2019-11-03 NOTE — ED Provider Notes (Signed)
[facility] DEPT Provider Note   CSN: [id number] Arrival date & time: 11/03/19  1106     History Chief Complaint  Patient presents with  . abd pain  . Vaginal Bleeding    Anne Harris is a 59 y.o. female presenting for evaluation of vaginal bleeding and lower abdominal pain.  Patient states the past 2 weeks, she has had persistent vaginal bleeding.  She also reports gradually worsening abdominal pain, to the point where she could not go to work last night.  Patient reports pain is in her lower abdomen, it is constant and worse when she is standing.  She states it feels like a pressure.  She was seen at [facility], ER last week for the same, had a pelvic exam which was unremarkable.  Had a stable hemoglobin.  Was recommended to follow-up with OB/GYN, she has appointment scheduled for next week.  Patient states she is in the ER today because she has had worsening pain, and her symptoms have continued.  She wants answers.  She denies fevers, chills, chest pain, shortness breath, nausea, vomiting.  She reports mild dysuria, no urinary frequency.  She denies change in bowel movements.  She is not taken anything for pain including Tylenol or ibuprofen.  Additional history obtained from chart review.  Reviewed ER visit from 10/25/19  HPI     History reviewed. No pertinent past medical history.  There are no problems to display for this patient.   History reviewed. No pertinent surgical history.   OB History   No obstetric history on file.     No family history on file.  Social History   Tobacco Use  . Smoking status: Never Smoker  Substance Use Topics  . Alcohol use: Yes    Comment: social  . Drug use: Not on file    Home Medications Prior to Admission medications   Medication Sig Start Date End Date Taking? Authorizing Provider  Multiple Vitamin (MULTIVITAMIN WITH MINERALS) TABS tablet Take 1 tablet by mouth daily.   Yes [provider]  cephALEXin (KEFLEX) 500 MG capsule Take 1 capsule (500 mg total) by mouth 2 (two) times daily for 5 days. 11/03/19 11/08/19  Permelia Bamba, PA-C  docusate sodium (COLACE) 100 MG capsule Take 1 capsule (100 mg total) by mouth 2 (two) times daily as needed for mild constipation or moderate constipation. 11/03/19   Tanijah Morais, PA-C  ferrous sulfate 325 (65 FE) MG tablet Take 1 tablet (325 mg total) by mouth daily. 11/03/19   Talor Desrosiers, PA-C  naproxen (NAPROSYN) 500 MG tablet Take 1 tablet (500 mg total) by mouth 2 (two) times daily with a meal. 11/03/19   Buryl Bamber, PA-C    Allergies    Patient has no known allergies.  Review of Systems   Review of Systems  Gastrointestinal: Positive for abdominal pain.  Genitourinary: Positive for vaginal bleeding.  All other systems reviewed and are negative.   Physical Exam Updated Vital Signs BP (!) 174/97   Pulse 84   Temp 99 F (37.2 C)   Resp 18   SpO2 100%   Physical Exam Vitals and nursing note reviewed.  Constitutional:      General: She is not in acute distress.    Appearance: She is well-developed.     Comments: Appears anxious/upset, but nontoxic  HENT:     Head: Normocephalic and atraumatic.  Eyes:     Extraocular Movements: Extraocular movements intact.  Conjunctiva/sclera: Conjunctivae normal.     Pupils: Pupils are equal, round, and reactive to light.  Cardiovascular:     Rate and Rhythm: Normal rate and regular rhythm.     Pulses: Normal pulses.  Pulmonary:     Effort: Pulmonary effort is normal. No respiratory distress.     Breath sounds: Normal breath sounds. No wheezing.  Abdominal:     General: There is no distension.     Palpations: Abdomen is soft. There is no mass.     Tenderness: There is abdominal tenderness. There is no guarding or rebound.     Comments: Tenderness palpation of lower abdomen.  No rigidity, guarding, distention.  Negative rebound.  No peritonitis.  Pain is not  located to one side, but is diffusely in the lower abdomen  Genitourinary:    Comments: Patient declines GU exam Musculoskeletal:        General: Normal range of motion.     Cervical back: Normal range of motion and neck supple.  Skin:    General: Skin is warm and dry.     Capillary Refill: Capillary refill takes less than 2 seconds.  Neurological:     Mental Status: She is alert and oriented to person, place, and time.     ED Results / Procedures / Treatments   Labs (all labs ordered are listed, but only abnormal results are displayed) Labs Reviewed  COMPREHENSIVE METABOLIC PANEL - Abnormal; Notable for the following components:      Result Value   Glucose, Bld 104 (*)    BUN <5 (*)    All other components within normal limits  CBC - Abnormal; Notable for the following components:   Hemoglobin 9.9 (*)    HCT 32.1 (*)    MCH 24.8 (*)    All other components within normal limits  URINALYSIS, ROUTINE W REFLEX MICROSCOPIC - Abnormal; Notable for the following components:   Color, Urine AMBER (*)    APPearance HAZY (*)    Hgb urine dipstick LARGE (*)    Ketones, ur 20 (*)    Protein, ur 100 (*)    Leukocytes,Ua LARGE (*)    RBC / HPF >50 (*)    WBC, UA >50 (*)    Bacteria, UA FEW (*)    All other components within normal limits  LIPASE, BLOOD    EKG None  Radiology No results found.  Procedures Procedures (including critical care time)  Medications Ordered in ED Medications  sodium chloride flush (NS) 0.9 % injection 3 mL (3 mLs Intravenous Not Given 11/03/19 1138)  ketorolac (TORADOL) 15 MG/ML injection 15 mg (15 mg Intramuscular Given 11/03/19 1206)    ED Course  I have reviewed the triage vital signs and the nursing notes.  Pertinent labs & imaging results that were available during my care of the patient were reviewed by me and considered in my medical decision making (see chart for details).    MDM Rules/Calculators/A&P                      Patient  presenting for evaluation of lower abdominal pain and continued vaginal bleeding.  On exam, patient is nontoxic.  She does have tenderness palpation of the lower abdomen.  She was seen in the ER last week for the same, encouraged to follow-up with OB/GYN.  She has appointment next week.  As patient has continued bleeding, and is mildly tachycardic, although similar to previous, will recheck hemoglobin to ensure  no significant change.  I do not believe emergent imaging is necessary or beneficial today, as I believe she needs biopsy/sampling for complete evaluation.  Discussed with patient.  Discussed pain control today with a Toradol shot.  Urine was contaminated at her last visit, due to dysuria today will recheck urine.  UA today consistent with infection, shows large leuks, greater than 50 white cells, and few bacteria.  Does also have blood, but this is likely due to vaginal bleeding.  In the setting of worsening pain and dysuria, will treat for infection.  Hemoglobin 9.9 today, less than 1 g drop over the past 9 days.  Heart rate improved with pain control, as such, I do not believe she needs emergent blood transfusion.  Will have patient start iron pills to support blood counts.  Offered ultrasound for further evaluation, however patient declined and will follow up with OB/GYN.  Encouraged treatment with anti-inflammatories, prescription for naproxen given.  Case discussed with attending, Dr. Langston Masker evaluated the patient.  Discussed patient's elevated blood pressure today, encouraged her to follow-up with primary care.  At this time, patient appears safe for discharge.  Return precautions given.  Patient states she understands and agrees to plan.  Final Clinical Impression(s) / ED Diagnoses Final diagnoses:  Vaginal bleeding  Lower abdominal pain  Urinary tract infection with hematuria, site unspecified  Anemia, unspecified type  Elevated blood pressure reading without diagnosis of hypertension     Rx / DC Orders ED Discharge Orders         Ordered    cephALEXin (KEFLEX) 500 MG capsule  2 times daily     11/03/19 1319    ferrous sulfate 325 (65 FE) MG tablet  Daily     11/03/19 1319    docusate sodium (COLACE) 100 MG capsule  2 times daily PRN     11/03/19 1319    naproxen (NAPROSYN) 500 MG tablet  2 times daily with meals     11/03/19 1319           Bejamin Hackbart, PA-C 11/03/19 1423    Wyvonnia Dusky, MD 11/03/19 1753

## 2019-11-03 NOTE — ED Notes (Signed)
PA at bedside.

## 2019-11-10 ENCOUNTER — Other Ambulatory Visit: Payer: Self-pay

## 2019-11-10 ENCOUNTER — Ambulatory Visit: Payer: Self-pay | Admitting: *Deleted

## 2019-11-10 ENCOUNTER — Ambulatory Visit
Admission: RE | Admit: 2019-11-10 | Discharge: 2019-11-10 | Disposition: A | Discharge status: Home / Self Care | Payer: No Typology Code available for payment source | Source: Ambulatory Visit | Attending: Obstetrics and Gynecology | Admitting: Obstetrics and Gynecology

## 2019-11-10 VITALS — BP 152/94 | Wt 246.0 lb

## 2019-11-10 DIAGNOSIS — Z124 Encounter for screening for malignant neoplasm of cervix: Secondary | ICD-10-CM

## 2019-11-10 DIAGNOSIS — Z1231 Encounter for screening mammogram for malignant neoplasm of breast: Secondary | ICD-10-CM

## 2019-11-10 DIAGNOSIS — Z01419 Encounter for gynecological examination (general) (routine) without abnormal findings: Secondary | ICD-10-CM

## 2019-11-10 NOTE — Progress Notes (Signed)
Anne Harris is a 59 y.o. No obstetric history on file. female who presents to [facility] clinic today with complaints of Post Menopausal Bleeding. Patient scheduled at the Center for [facility] on 11/28/2019 for follow-up.   Pap Smear: Pap smear completed today. Last Pap smear was around 30 years ago and was normal per patient. Per patient has no history of an abnormal Pap smear. Last Pap smear result is not in Epic.   Physical exam: Breasts Breasts symmetrical. No skin abnormalities bilateral breasts. No nipple retraction bilateral breasts. No nipple discharge bilateral breasts. No lymphadenopathy. No lumps palpated bilateral breasts. No complaints of pain or tenderness on exam.       Pelvic/Bimanual Ext Genitalia No lesions, no swelling and blood observed on external genitalia.        Vagina Vagina pink and normal texture. No lesions and blood observed in vagina. Patient is scheduled to follow-up at the Center for [facility] for PMB.        Cervix Cervix is present. Cervix is reddened and rugged. Multiple white lesions observed on cervix. Unable to view the entire cervix due to blood.   Uterus Uterus is present and palpable. Uterus in normal position and slightly enlarged.    Adnexae Bilateral ovaries present and palpable. No tenderness on palpation.         Rectovaginal No rectal exam completed today since patient had no rectal complaints. No skin abnormalities observed on exam.     Smoking History: Patient has never smoked.   Patient Navigation: Patient education provided. Access to services provided for patient through [facility] program.  Colorectal Cancer Screening: Per patient has never had a colonoscopy completed. No complaints today.    Breast and Cervical Cancer Risk Assessment: Patient has no family history of breast cancer, known genetic mutations, or radiation treatment to the chest before age 62. Patient has no history of cervical dysplasia,  immunocompromised, or DES exposure in-utero.  Risk Assessment    Risk Scores      11/10/2019   Last edited by: Loletta Parish, RN   5-year risk: 1.4 %   Lifetime risk: 6.9 %          A: BCCCP exam with pap smear  P: Referred patient to the [facility] for a screening mammogram. Appointment scheduled Thursday, November 10, 2019 at 1350 at mobile unit.  Loletta Parish, RN 11/10/2019 12:37 PM

## 2019-11-10 NOTE — Patient Instructions (Signed)
Explained breast self awareness with Anne Harris. Let patient know that follow-up for today's Pap smear will be based on the result. Referred patient to the [facility] for a screening mammogram. Appointment scheduled Thursday, November 10, 2019 at 1350 at mobile unit. Patient aware of appointment and will be there. Let patient know will follow up with her within the next couple weeks with results of Pap smear by letter or phone. Informed patient that the Breast Center will follow-up with her within the next couple of weeks with results of her mammogram by letter or phone. Anne Harris verbalized understanding.  Anne Harris, Arvil Chaco, RN 12:37 PM

## 2019-11-15 ENCOUNTER — Other Ambulatory Visit: Payer: Self-pay | Admitting: Obstetrics and Gynecology

## 2019-11-15 ENCOUNTER — Telehealth: Payer: Self-pay | Admitting: *Deleted

## 2019-11-15 DIAGNOSIS — R928 Other abnormal and inconclusive findings on diagnostic imaging of breast: Secondary | ICD-10-CM

## 2019-11-15 LAB — CYTOLOGY - PAP
Comment: NEGATIVE
High risk HPV: POSITIVE — AB

## 2019-11-15 NOTE — Telephone Encounter (Signed)
Attempted to call patient to discuss Pap smear result and give follow-up appointment to the Anne Harris. No one answered the phone left a voicemail for patient to call me back.  Patient called me back. Explained to patient that her Pap smear showed high abnormal cells and it is recommended for her to follow-up with GYN Oncology. Gave patient follow-up appointment at GYN Oncology for Monday, November 21, 2019 with an arrival time of 1130. Gave patient address to the [facility] and answered all her questions. Patient verbalized understanding.

## 2019-11-16 ENCOUNTER — Telehealth: Payer: Self-pay

## 2019-11-16 NOTE — Telephone Encounter (Signed)
Called patient and completed Medicaid application, Patient to sign application on 32/99/2426 prior to appointment at [facility].

## 2019-11-21 ENCOUNTER — Encounter: Payer: Self-pay | Admitting: Gynecologic Oncology

## 2019-11-21 ENCOUNTER — Other Ambulatory Visit: Payer: Self-pay

## 2019-11-21 ENCOUNTER — Inpatient Hospital Stay: Payer: BC Managed Care – PPO | Attending: Gynecologic Oncology | Admitting: Gynecologic Oncology

## 2019-11-21 VITALS — BP 165/96 | HR 88 | Temp 99.2°F | Resp 18 | Ht 64.0 in | Wt 250.5 lb

## 2019-11-21 DIAGNOSIS — Z800 Family history of malignant neoplasm of digestive organs: Secondary | ICD-10-CM | POA: Diagnosis not present

## 2019-11-21 DIAGNOSIS — Z6841 Body Mass Index (BMI) 40.0 and over, adult: Secondary | ICD-10-CM | POA: Insufficient documentation

## 2019-11-21 DIAGNOSIS — C539 Malignant neoplasm of cervix uteri, unspecified: Secondary | ICD-10-CM | POA: Insufficient documentation

## 2019-11-21 NOTE — Progress Notes (Signed)
GYNECOLOGIC ONCOLOGY NEW PATIENT CONSULTATION   Patient Name: Anne Harris  Patient Age: 59 y.o. 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   I discussed with the patient her recent Pap test results as well as my exam today.  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  I took multiple biopsies today and will call her with these results once I have them, likely Wednesday.  Given my exam, we discussed that she is not a surgical candidate as there is already evidence of disease spread into the parametrial tissue.  We discussed the next steps in work-up, which will be to evaluate for any evidence of locally metastatic disease that I cannot appreciate on exam or distant metastatic disease as this will dictate some treatment decisions.  I am recommending that we move forward with primary chemoradiation.  The patient knows that I will reach out to get her scheduled to see both her medical oncologist as well as her radiation oncologist.  I have ordered a CT scan and PET scan.  I do not appreciate involvement to the sidewall and her most recent creatinine earlier this month was normal at 0.75.  The patient is scheduled to undergo further diagnostic mammogram and ultrasound due to inability to adequately image the left breast at her recent screening mammogram.  A copy of this note was sent to the patient's referring provider.   45 minutes of total time was spent for this patient encounter, including preparation, face-to-face counseling with the patient and coordination of care, and documentation of the encounter.  Jeral Pinch, MD  Division of Gynecologic Oncology  Department of Obstetrics  and Gynecology  [facility]  ___________________________________________  Chief Complaint: Chief Complaint  Patient presents with  . Squamous cell carcinoma of cervix ([place])    New Patient    History of Present Illness:  Anne Harris is a 59 y.o. y.o. female who is seen in consultation at the request of Constant, Peggy, MD for an evaluation of squamous cell carcinoma on a Pap test performed through the breast and cervical cancer risk assessment program.  The patient presented to the emergency department on 5/25 and again on 6/3 for abdominal pain and postmenopausal bleeding.  On 6/10 she underwent Pap smear.  Last Pap smear had been 30 years prior and normal per patient.  She reports no postmenopausal bleeding until May 21 of this year.  She had bleeding until June 6 and then her bleeding stopped.  During that time, she endorses low abdominal pain intermittently.  Starting yesterday, she is begun having very light spotting again.  She now has constant suprapubic pain which she rates at a 2 out of 10 on a 10 point pain scale.  She takes ibuprofen as needed which helps relieve the pain.  She took some before coming to the office today.  She endorses having a good appetite without nausea or emesis.  She reports normal bowel function and denies any urinary symptoms.  She denies any back pain or significant lower extremity edema.  Patient has not been to see a primary care doctor in at least 30 years.  She was given the number by somebody  at her recent ED visit to call to get set up with a primary care physician.  Similarly, she has had no health maintenance in approximately 30 years.  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  PAST MEDICAL HISTORY:  Past Medical History:  Diagnosis Date  . Abnormal Pap smear of cervix   . Cervical cancer ([place])   . Hypertension   . Obesity, Class III, BMI 40-49.9 (morbid obesity) ([place])       PAST SURGICAL HISTORY:  History reviewed. No pertinent surgical history.  OB/GYN HISTORY:  OB History  Gravida Para Term Preterm AB Living  6 6          SAB TAB Ectopic Multiple Live Births               # Outcome Date GA Lbr Len/2nd Weight Sex Delivery Anes PTL Lv  6 Para           5 Para           4 Para           3 Para           2 Para           1 Para             No LMP recorded. Patient is postmenopausal.  Age at menarche: 39 Age at menopause: 73 Hx of HRT: Denies Hx of STDs: [place] Last pap: 11/2019 History of abnormal pap smears: Yes, most recent Pap smear showed squamous cell carcinoma   SCREENING STUDIES:  Last mammogram: 2021  Last colonoscopy: N/A  MEDICATIONS: Outpatient Encounter Medications as of 11/21/2019  Medication Sig  . docusate sodium (COLACE) 100 MG capsule Take 1 capsule (100 mg total) by mouth 2 (two) times daily as needed for mild constipation or moderate constipation.  . ferrous sulfate 325 (65 FE) MG tablet Take 1 tablet (325 mg total) by mouth daily.  . Multiple Vitamin (MULTIVITAMIN WITH MINERALS) TABS tablet Take 1 tablet by mouth daily.  . naproxen (NAPROSYN) 500 MG tablet Take 1 tablet (500 mg total) by mouth 2 (two) times daily with a meal.   No facility-administered encounter medications on file as of 11/21/2019.    ALLERGIES:  No Known Allergies   FAMILY HISTORY:  Family History  Problem Relation Age of Onset  . Colon cancer Mother 32  . Ovarian cancer Neg Hx   . Uterine cancer Neg Hx   . Breast cancer Neg Hx   . Cervical cancer Neg Hx      SOCIAL HISTORY:    Social Connections:   . Frequency of Communication with Friends and Family:   . Frequency of Social Gatherings with Friends and Family:   . Attends Religious Services:   . Active Member of Clubs or Organizations:   . Attends Archivist Meetings:   Marland Kitchen Marital Status:     REVIEW OF SYSTEMS:  Denies appetite changes, fevers, chills, fatigue, unexplained  weight changes. Denies hearing loss, neck lumps or masses, mouth sores, ringing in ears or voice changes. Denies cough or wheezing.  Denies shortness of breath. Denies chest pain or palpitations. Denies leg swelling. Denies abdominal distention, pain, blood in stools, constipation, diarrhea, nausea, vomiting, or early satiety. Denies pain with intercourse, dysuria, frequency, hematuria or incontinence. Denies hot flashes, pelvic pain, vaginal bleeding or vaginal discharge.   Denies joint pain, back pain or  muscle pain/cramps. Denies itching, rash, or wounds. Denies dizziness, headaches, numbness or seizures. Denies swollen lymph nodes or glands, denies easy bruising or bleeding. Denies anxiety, depression, confusion, or decreased concentration.  Physical Exam:  Vital Signs for this encounter:  Blood pressure (!) 165/96, pulse 88, temperature 99.2 F (37.3 C), temperature source Oral, resp. rate 18, height 5\' 4"  (1.626 m), weight 250 lb 8 oz (113.6 kg), SpO2 100 %. Body mass index is 43 kg/m. General: Alert, oriented, no acute distress.  HEENT: Normocephalic, atraumatic. Sclera anicteric.  Chest: Clear to auscultation bilaterally. No wheezes, rhonchi, or rales. Cardiovascular: Regular rate and rhythm, no rubs, or gallops. Systolic murmur. Abdomen: Obese. Normoactive bowel sounds. Soft, nondistended, mild tenderness to palpation in the RLQ and suprapubic area. No masses or hepatosplenomegaly appreciated. No palpable fluid wave. Rectus diastasis noted.  Extremities: Grossly normal range of motion. Warm, well perfused. No edema bilaterally.  Skin: No rashes or lesions.  Lymphatics: No cervical, supraclavicular, or inguinal adenopathy.  GU:  Normal external female genitalia.  No lesions. No discharge or bleeding.             Bladder/urethra:  No lesions or masses, well supported bladder             Vagina: Well rugated vaginal mucosa.              Cervix: Cervix is almost completely replaced  by a friable frondular mass. With the patient's verbal consent, biopsies performed. The cervical mass was cleansed with betadine x3. Several punch biopsies were taken and placed in formalin. Silver nitrate and pressure were used to achieve hemostasis. Overall, the patient tolerated the procedure well.             Uterus: Difficult to appreciated size due to body habitus. Somewhat mobile, cervix and lower uterine segment bulky. Cervical mass itself is 4-5cm - it is difficult to appreciate whether there is a rim of normal cervical tissue on the right and anteriorly. No obvious vaginal involvement. Parametrial involvement bilaterally although not to either sidewall.             Adnexa: No masses appreciated.  Rectal: Confirmed vaginal exam findings.  LABORATORY AND RADIOLOGIC DATA:   Pap 6/10: SCC

## 2019-11-21 NOTE — Patient Instructions (Signed)
I will call you with the results of the CT scan and PET once I have them. I am referring you to see our radiation oncologist and our medical oncologist for treatment of your cervical cancer. Based on my exam today, I am very concerned that you have a cancer of the cervix

## 2019-11-22 ENCOUNTER — Telehealth: Payer: Self-pay | Admitting: Oncology

## 2019-11-22 ENCOUNTER — Encounter: Payer: Self-pay | Admitting: Oncology

## 2019-11-22 ENCOUNTER — Telehealth: Payer: Self-pay | Admitting: Gynecologic Oncology

## 2019-11-22 DIAGNOSIS — C539 Malignant neoplasm of cervix uteri, unspecified: Secondary | ICD-10-CM

## 2019-11-22 LAB — SURGICAL PATHOLOGY

## 2019-11-22 NOTE — Telephone Encounter (Signed)
Called [facility] and scheduled a new patient appointment with Dr. Alvy Bimler on 11/30/2019 at 9:30. Also discussed that Radiation Oncology will be calling as well with an appointment.  She verbalized understanding and agreement.

## 2019-11-22 NOTE — Telephone Encounter (Signed)
Called the patient to review biopsy results from yesterday.  No answer.  Left voicemail with callback number.  Jeral Pinch MD Gynecologic Oncology

## 2019-11-23 ENCOUNTER — Ambulatory Visit
Admission: RE | Admit: 2019-11-23 | Discharge: 2019-11-23 | Disposition: A | Discharge status: Home / Self Care | Payer: No Typology Code available for payment source | Source: Ambulatory Visit | Attending: Obstetrics and Gynecology | Admitting: Obstetrics and Gynecology

## 2019-11-23 ENCOUNTER — Other Ambulatory Visit: Payer: Self-pay

## 2019-11-23 ENCOUNTER — Telehealth: Payer: Self-pay | Admitting: Gynecologic Oncology

## 2019-11-23 ENCOUNTER — Other Ambulatory Visit: Payer: Self-pay | Admitting: Obstetrics and Gynecology

## 2019-11-23 DIAGNOSIS — R928 Other abnormal and inconclusive findings on diagnostic imaging of breast: Secondary | ICD-10-CM

## 2019-11-23 NOTE — Telephone Encounter (Signed)
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 Patient is scheduled to see Dr. Alvy Bimler, will be scheduled with Dr. Sondra Come - plan is for definitive treatment with chemoradiation. Imaging pending. All questions answered.  Jeral Pinch MD Gynecologic Oncology

## 2019-11-28 ENCOUNTER — Ambulatory Visit: Payer: Self-pay | Admitting: Obstetrics & Gynecology

## 2019-11-28 ENCOUNTER — Telehealth: Payer: Self-pay | Admitting: Family Medicine

## 2019-11-28 NOTE — Telephone Encounter (Signed)
Called patient and left a detailed message to call us to get her appointment rescheduled.

## 2019-11-29 ENCOUNTER — Ambulatory Visit (HOSPITAL_COMMUNITY)
Admission: RE | Admit: 2019-11-29 | Discharge: 2019-11-29 | Disposition: A | Discharge status: Home / Self Care | Payer: BC Managed Care – PPO | Source: Ambulatory Visit | Attending: Gynecologic Oncology | Admitting: Gynecologic Oncology

## 2019-11-29 ENCOUNTER — Other Ambulatory Visit: Payer: Self-pay

## 2019-11-29 DIAGNOSIS — C539 Malignant neoplasm of cervix uteri, unspecified: Secondary | ICD-10-CM | POA: Diagnosis not present

## 2019-11-29 LAB — GLUCOSE, CAPILLARY: Glucose-Capillary: 98 mg/dL (ref 70–99)

## 2019-11-29 MED ORDER — FLUDEOXYGLUCOSE F - 18 (FDG) INJECTION
12.5500 | Freq: Once | INTRAVENOUS | Status: AC | PRN
  Administered 2019-11-29: 12.55 via INTRAVENOUS

## 2019-11-30 ENCOUNTER — Telehealth: Payer: Self-pay | Admitting: Oncology

## 2019-11-30 ENCOUNTER — Inpatient Hospital Stay: Payer: BC Managed Care – PPO | Admitting: Hematology and Oncology

## 2019-11-30 ENCOUNTER — Telehealth: Payer: Self-pay

## 2019-11-30 ENCOUNTER — Encounter: Payer: Self-pay | Admitting: Hematology and Oncology

## 2019-11-30 DIAGNOSIS — R590 Localized enlarged lymph nodes: Secondary | ICD-10-CM | POA: Insufficient documentation

## 2019-11-30 DIAGNOSIS — D638 Anemia in other chronic diseases classified elsewhere: Secondary | ICD-10-CM | POA: Insufficient documentation

## 2019-11-30 NOTE — Telephone Encounter (Signed)
Medicaid application faxed to [employer] @ [place].

## 2019-11-30 NOTE — Telephone Encounter (Signed)
Since she is not seeing Dr. Sondra Come until 7/19, I can see her 7/14 at 120 pm, 1 hour

## 2019-11-30 NOTE — Telephone Encounter (Signed)
Called Anne Harris back and rescheduled her appointment to see Dr. Alvy Bimler to 12/14/19 at 1:20.  Advised her that I will her call her the day before to remind her of the appointment.

## 2019-11-30 NOTE — Telephone Encounter (Signed)
Called Anne Harris regarding appointment with Dr. Alvy Bimler today.  She said she forgot about the appointment and would like to reschedule.  Advised her that I will call her back with a new appointment.

## 2019-12-01 ENCOUNTER — Telehealth: Payer: Self-pay | Admitting: Oncology

## 2019-12-01 ENCOUNTER — Telehealth: Payer: Self-pay | Admitting: Gynecologic Oncology

## 2019-12-01 ENCOUNTER — Encounter: Payer: Self-pay | Admitting: *Deleted

## 2019-12-01 ENCOUNTER — Telehealth: Payer: Self-pay | Admitting: Pulmonary Disease

## 2019-12-01 DIAGNOSIS — R590 Localized enlarged lymph nodes: Secondary | ICD-10-CM

## 2019-12-01 NOTE — Progress Notes (Signed)
I received a message that patient needs to be seen soon for tissue dx due to her starting tx.  I reached out to [facility] and they can get her in next week.  I will update Dr. Berline Lopes and her team.

## 2019-12-01 NOTE — Telephone Encounter (Signed)
Kaina called and was advised of her PET scan results and the recommendation from Thoracic Conference today that the pulmonary disease findings may be due to sarcoidosis or similar process, and that a referral has been placed for Dr. Lamonte Sakai with Pulmonology to discuss a biopsy. Charlann verbalized understanding and knows that Dr. Agustina Caroli office will be calling her with an appointment.

## 2019-12-01 NOTE — Telephone Encounter (Signed)
Called the patient to review PET results and recommendation to work-up FDG avid lung lesions. Patient presented at tumor board and while high suspicion that pulmonary disease findings may be due to sarcoidosis or similar process, recommendation made to evaluate histologically. Plan for referral to CT surgery to discuss biopsy. Asked patient to call back (either to speak with me or Elmo Putt).   Jeral Pinch MD Gynecologic Oncology

## 2019-12-01 NOTE — Telephone Encounter (Signed)
Contacted by Dr. Lamonte Sakai. Cased reviewed in [place].   We will schedule for EBUS on Mon, Tues, or Wednesday of next week [facility] with Drs Valeta Harms or Tamala Julian.   We will meet and do consultation day of procedure with patient in pre-op.   Thanks  Garner Nash, DO [facility] 12/01/2019 6:19 PM

## 2019-12-01 NOTE — Telephone Encounter (Signed)
-----   Message from Collene Gobble, MD sent at 12/01/2019  5:33 PM EDT ----- Regarding: FW: referral for EBUS Brad, this patient was discussed in thoracic conference this am.  New dx of cervical cancer. PET shows mediastinal LAd and nodular disease that looks like sarcoidosis, but needs to be evaluated for possible mets (weird location for it to go).  She needs EBUS, the peripheral nodules are all small. I'm not sure our workup needs to slow down progress with her getting rx for cervical CA based on my suspicion for sarcoid, but I believe Dr Berline Lopes wants our tissue before starting any treatment.   Rob   ----- Message ----- From: Valrie Hart, RN Sent: 12/01/2019  10:11 AM EDT To: Collene Gobble, MD, Jacqulyn Liner, RN Subject: update                                         Hey Dr. Lamonte Sakai, I completed referral on Ms. Buser.  She is the lady of Dr. Berline Lopes who needs bronchoscopy before starting chemo RT.  Let me know if you need my assistance with anything. Thanks Hinton Dyer

## 2019-12-01 NOTE — Progress Notes (Signed)
Per Dr. Berline Lopes, I completed referral to pulmonary for bronchoscopy.

## 2019-12-02 ENCOUNTER — Ambulatory Visit (HOSPITAL_COMMUNITY)
Admission: RE | Admit: 2019-12-02 | Discharge: 2019-12-02 | Disposition: A | Discharge status: Home / Self Care | Payer: BC Managed Care – PPO | Source: Ambulatory Visit | Attending: Gynecologic Oncology | Admitting: Gynecologic Oncology

## 2019-12-02 ENCOUNTER — Other Ambulatory Visit: Payer: Self-pay

## 2019-12-02 DIAGNOSIS — C539 Malignant neoplasm of cervix uteri, unspecified: Secondary | ICD-10-CM | POA: Diagnosis not present

## 2019-12-02 MED ORDER — IOHEXOL 300 MG/ML  SOLN
100.0000 mL | Freq: Once | INTRAMUSCULAR | Status: AC | PRN
  Administered 2019-12-02: 100 mL via INTRAVENOUS

## 2019-12-02 MED ORDER — SODIUM CHLORIDE (PF) 0.9 % IJ SOLN
INTRAMUSCULAR | Status: AC
  Filled 2019-12-02: qty 50

## 2019-12-03 ENCOUNTER — Inpatient Hospital Stay (HOSPITAL_COMMUNITY): Admission: RE | Admit: 2019-12-03 | Payer: No Typology Code available for payment source | Source: Ambulatory Visit

## 2019-12-06 NOTE — Telephone Encounter (Signed)
Maddyson Dial 02/13/1961 EBUS 12/07/2019 @ 8:30am [facility] - Endo Icard 12/03/2019 @ 9:15am

## 2019-12-07 ENCOUNTER — Telehealth: Payer: Self-pay | Admitting: Gynecologic Oncology

## 2019-12-07 ENCOUNTER — Telehealth: Payer: Self-pay | Admitting: Oncology

## 2019-12-07 NOTE — Telephone Encounter (Signed)
Called [place] and discussed the EBUS scheduled for 12/15/19 with Dr. Valeta Harms and that the appointment with Dr. Roxan Hockey has been canceled.  She would like to know her CT results and is wondering if Dr. Berline Lopes can call her to discuss the upcoming procedure.

## 2019-12-07 NOTE — Telephone Encounter (Signed)
Called patient - discussed PET and CT findings again. Discussed reason for biopsy of pulmonary findings to r/o metastatic cancer. All questions answered. Jeral Pinch MD Gynecologic Oncology

## 2019-12-12 ENCOUNTER — Other Ambulatory Visit: Payer: Self-pay | Admitting: Oncology

## 2019-12-12 ENCOUNTER — Encounter: Payer: Self-pay | Admitting: Oncology

## 2019-12-12 NOTE — Progress Notes (Signed)
Gynecologic Oncology Multi-Disciplinary Disposition Conference Note  Date of the Conference: 12/12/2019  Patient Name: Anne Harris  Referring Provider: Dr. Elly Modena Primary GYN Oncologist: Dr. 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 Disposition is to definitive treatment with concurrent chemoradiation. Also referral to pulmonary to discuss biopsy of lung lesions.   This Multidisciplinary conference took place involving physicians from [place], [place], Radiation Oncology, Pathology, Radiology along with the Gynecologic Oncology Nurse Practitioner and RN.  Comprehensive assessment of the patient's malignancy, staging, need for surgery, chemotherapy, radiation therapy, and need for further testing were reviewed. Supportive measures, both inpatient and following discharge were also discussed. The recommended plan of care is documented. Greater than 35 minutes were spent correlating and coordinating this patient's care.

## 2019-12-13 ENCOUNTER — Other Ambulatory Visit (HOSPITAL_COMMUNITY)
Admission: RE | Admit: 2019-12-13 | Discharge: 2019-12-13 | Disposition: A | Discharge status: Home / Self Care | Payer: BC Managed Care – PPO | Source: Ambulatory Visit | Attending: Pulmonary Disease | Admitting: Pulmonary Disease

## 2019-12-13 DIAGNOSIS — Z01812 Encounter for preprocedural laboratory examination: Secondary | ICD-10-CM | POA: Diagnosis present

## 2019-12-13 DIAGNOSIS — Z20822 Contact with and (suspected) exposure to covid-19: Secondary | ICD-10-CM | POA: Diagnosis not present

## 2019-12-13 LAB — SARS CORONAVIRUS 2 (TAT 6-24 HRS): SARS Coronavirus 2: NEGATIVE

## 2019-12-14 ENCOUNTER — Other Ambulatory Visit: Payer: Self-pay

## 2019-12-14 ENCOUNTER — Encounter: Payer: Self-pay | Admitting: Oncology

## 2019-12-14 ENCOUNTER — Encounter: Payer: Self-pay | Admitting: Hematology and Oncology

## 2019-12-14 ENCOUNTER — Encounter (HOSPITAL_COMMUNITY): Payer: Self-pay | Admitting: Pulmonary Disease

## 2019-12-14 ENCOUNTER — Inpatient Hospital Stay: Payer: BC Managed Care – PPO | Attending: Gynecologic Oncology | Admitting: Hematology and Oncology

## 2019-12-14 ENCOUNTER — Telehealth: Payer: Self-pay | Admitting: Oncology

## 2019-12-14 VITALS — BP 161/85 | HR 86 | Temp 98.2°F | Resp 18 | Ht 64.0 in | Wt 243.0 lb

## 2019-12-14 DIAGNOSIS — C539 Malignant neoplasm of cervix uteri, unspecified: Secondary | ICD-10-CM | POA: Diagnosis present

## 2019-12-14 DIAGNOSIS — R590 Localized enlarged lymph nodes: Secondary | ICD-10-CM

## 2019-12-14 DIAGNOSIS — E66813 Obesity, class 3: Secondary | ICD-10-CM

## 2019-12-14 DIAGNOSIS — D638 Anemia in other chronic diseases classified elsewhere: Secondary | ICD-10-CM

## 2019-12-14 DIAGNOSIS — Z5111 Encounter for antineoplastic chemotherapy: Secondary | ICD-10-CM | POA: Diagnosis not present

## 2019-12-14 DIAGNOSIS — I10 Essential (primary) hypertension: Secondary | ICD-10-CM | POA: Insufficient documentation

## 2019-12-14 MED ORDER — LIDOCAINE-PRILOCAINE 2.5-2.5 % EX CREA: TOPICAL_CREAM | CUTANEOUS | 3 refills | Status: DC

## 2019-12-14 MED ORDER — PROCHLORPERAZINE MALEATE 10 MG PO TABS: 10.0000 mg | ORAL_TABLET | Freq: Four times a day (QID) | ORAL | 1 refills | Status: DC | PRN

## 2019-12-14 MED ORDER — ONDANSETRON HCL 8 MG PO TABS: 8.0000 mg | ORAL_TABLET | Freq: Three times a day (TID) | ORAL | 1 refills | Status: DC | PRN

## 2019-12-14 NOTE — Telephone Encounter (Signed)
Left a message advising that Dr. Alvy Bimler has sent anti emetics (zofran and compazine) and EMLA cream to [facility].  Requested a call back if she has any questions.

## 2019-12-14 NOTE — Progress Notes (Signed)
START OFF PATHWAY REGIMEN - Other   [id number]:Cisplatin 40 mg/m2 IV D1 q7 Days + RT:   A cycle is every 7 days:     Cisplatin   **Always confirm dose/schedule in your pharmacy ordering system**  Patient Characteristics: Intent of Therapy: Curative Intent, Discussed with Patient 

## 2019-12-14 NOTE — Assessment & Plan Note (Signed)
We have extensive discussion about her abnormal imaging She is currently scheduled for biopsy tomorrow

## 2019-12-14 NOTE — Progress Notes (Signed)
Pt denies SOB, chest pain, and being under the care of a cardiologist. Pt denies having a PCP. Pt denies having a stress test and cardiac cath. Pt denies recent labs. Pt denies having an EKG and chest x ray. Pt made aware to stop taking  Aspirin (unless otherwise advised by surgeon), vitamins, fish oil and herbal medications. Do not take any NSAIDs ie: Ibuprofen, Advil, Naproxen (Aleve), Motrin, BC and Goody Powder. Pt reminded to quarantine. Pt verbalized understanding of all pre-op instructions.

## 2019-12-14 NOTE — Assessment & Plan Note (Signed)
Observe for now, she does not need transfusion support

## 2019-12-14 NOTE — Assessment & Plan Note (Signed)
She is not on medications I recommend against NSAID

## 2019-12-14 NOTE — Assessment & Plan Note (Signed)
Due to her class III obesity, I will use adjusted BSA for chemotherapy dosing

## 2019-12-14 NOTE — Assessment & Plan Note (Signed)
Her case was recently discussed at the GYN oncology tumor board She is scheduled for biopsy tomorrow for mediastinal lymphadenopathy which we believe might not be related to her cancer Assuming that the pathology results came back benign, her locally advanced cervical cancer can be treated with concurrent chemoradiation therapy  We discussed the role of chemotherapy. The intent is of curative intent.  We discussed some of the risks, benefits, side-effects of cisplatin and its role as chemo sensitizing agent. The plan for weekly cisplatin for x5 doses along with radiation treatment.  Some of the short term side-effects included, though not limited to, including weight loss, life threatening infections, risk of allergic reactions, need for transfusions of blood products, nausea, vomiting, change in bowel habits, loss of hair, admission to hospital for various reasons, and risks of death.   Long term side-effects are also discussed including risks of infertility, permanent damage to nerve function, hearing loss, chronic fatigue, kidney damage with possibility needing hemodialysis, and rare secondary malignancy including bone marrow disorders.  The patient is aware that the response rates discussed earlier is not guaranteed.  After a long discussion, patient made an informed decision to proceed with the prescribed plan of care.   Patient education material was dispensed.  I will schedule chemo education class and port placement prior to starting on treatment I will tentatively schedule chemotherapy to start on July 30, weekly x5 I will see her weekly for toxicity review I recommend her to not take NSAID due to risk of kidney injury and uncontrolled hypertension

## 2019-12-14 NOTE — Progress Notes (Signed)
[facility] CONSULT NOTE  Patient Care Team: Mora Bellman, MD as PCP - General (Obstetrics and Gynecology) Jacqulyn Liner, RN as Oncology Nurse Navigator (Oncology)  ASSESSMENT & PLAN:  Squamous cell carcinoma of cervix [facility]) Her case was recently discussed at the GYN oncology tumor board She is scheduled for biopsy tomorrow for mediastinal lymphadenopathy which we believe might not be related to her cancer Assuming that the pathology results came back benign, her locally advanced cervical cancer can be treated with concurrent chemoradiation therapy  We discussed the role of chemotherapy. The intent is of curative intent.  We discussed some of the risks, benefits, side-effects of cisplatin and its role as chemo sensitizing agent. The plan for weekly cisplatin for x5 doses along with radiation treatment.  Some of the short term side-effects included, though not limited to, including weight loss, life threatening infections, risk of allergic reactions, need for transfusions of blood products, nausea, vomiting, change in bowel habits, loss of hair, admission to hospital for various reasons, and risks of death.   Long term side-effects are also discussed including risks of infertility, permanent damage to nerve function, hearing loss, chronic fatigue, kidney damage with possibility needing hemodialysis, and rare secondary malignancy including bone marrow disorders.  The patient is aware that the response rates discussed earlier is not guaranteed.  After a long discussion, patient made an informed decision to proceed with the prescribed plan of care.   Patient education material was dispensed.  I will schedule chemo education class and port placement prior to starting on treatment I will tentatively schedule chemotherapy to start on July 30, weekly x5 I will see her weekly for toxicity review I recommend her to not take NSAID due to risk of kidney injury and uncontrolled  hypertension   Anemia due to chronic illness Observe for now, she does not need transfusion support  Mediastinal lymphadenopathy We have extensive discussion about her abnormal imaging She is currently scheduled for biopsy tomorrow  Obesity, Class III, BMI 40-49.9 (morbid obesity) ([place]) Due to her class III obesity, I will use adjusted BSA for chemotherapy dosing  Hypertension She is not on medications I recommend against NSAID   Orders Placed This Encounter  Procedures  . IR IMAGING GUIDED PORT INSERTION    Standing Status:   Future    Standing Expiration Date:   12/13/2020    Order Specific Question:   Reason for Exam (SYMPTOM  OR DIAGNOSIS REQUIRED)    Answer:   need port for chemo to start 7/30    Order Specific Question:   Is the patient pregnant?    Answer:   No    Order Specific Question:   Preferred Imaging Location?    Answer:   [facility]  . CBC with Differential ([place] Only)    Standing Status:   Standing    Number of Occurrences:   20    Standing Expiration Date:   12/13/2020  . Basic Metabolic Panel - [place] Only    Standing Status:   Standing    Number of Occurrences:   20    Standing Expiration Date:   12/13/2020  . Magnesium    Standing Status:   Standing    Number of Occurrences:   9    Standing Expiration Date:   12/13/2020    The total time spent in the appointment was 60 minutes encounter with patients including review of chart and various tests results, discussions about plan of  care and coordination of care plan   All questions were answered. The patient knows to call the clinic with any problems, questions or concerns. No barriers to learning was detected.  Heath Lark, MD 7/14/20212:35 PM  CHIEF COMPLAINTS/PURPOSE OF CONSULTATION:  Cervical cancer  HISTORY OF PRESENTING ILLNESS:  Anne Harris 59 y.o. female is here because of recent diagnosis of cervical cancer I have reviewed her chart and materials related to  her cancer extensively and collaborated history with the patient. Summary of oncologic history is as follows: Oncology History  Squamous cell carcinoma of cervix ([place])  10/21/2019 Initial Diagnosis   The patient presented to the emergency department on 5/25 and again on 6/3 for abdominal pain and postmenopausal bleeding that started 5/21.  On 6/10 she underwent Pap smear.  Last Pap smear had been 30 years prior and normal per patient.     11/21/2019 Pathology Results   A. CERVIX, BIOPSY:  -  Squamous cell carcinoma    11/29/2019 PET scan   1. Highly hypermetabolic cervical mass, with fluid distended endometrial cavity. Maximum SUV of the cervical mass is 25.8. 2. Left adnexal lesion versus more likely fundal fibroid, maximum SUV 6.0. Surveillance suggested. 3. There is low-grade activity in several abdominal lymph nodes including a borderline enlarged peripancreatic lymph node. However, there is striking activity in numerous enlarged thoracic lymph nodes, some of which have associated calcifications. It would be unusual to have this degree of thoracic adenopathy from cervical cancer without having a commensurate level of abdominopelvic adenopathy. This along with the calcifications of the lymph nodes raises the possibility of active granulomatous process (such as sarcoidosis) as a potential cause for the striking thoracic adenopathy. Concomitant lymphoma might also have a similar appearance. Sampling of thoracic lymph nodes may be helpful in differentiating metastatic disease other entities such as sarcoid or Lymphoma.  4. Mild pleural/subpleural nodularity in the lungs, merit surveillance. 5. Cholelithiasis.   11/30/2019 Cancer Staging   Staging form: Cervix Uteri, AJCC Version 9 - Clinical stage from 11/30/2019: FIGO Stage IIB (cT2b, cN0, cM0) - Signed by Heath Lark, MD on 11/30/2019   12/02/2019 Imaging   Ct abdomen and pelvis 1. Cervical soft tissue mass as described in keeping with known  malignancy. 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 2. Mildly rounded subcentimeter aortocaval and right pelvic sidewall lymph nodes. 3. A 5.3 x 3.9 cm left fundal exophytic soft tissue suboptimally characterized but possibly a fibroid. 4. Cholelithiasis. 5. No bowel obstruction. Normal appendix. 6. Small bilateral lung base subpleural nodules similar to prior PET CT. Clinical correlation and follow-up recommended.    She has scant vaginal discharge but no significant vaginal bleeding She takes ibuprofen for pelvic discomfort  MEDICAL HISTORY:  Past Medical History:  Diagnosis Date  . Abnormal Pap smear of cervix   . Cervical cancer ([place])   . Hypertension   . Obesity, Class III, BMI 40-49.9 (morbid obesity) ([place])     SURGICAL HISTORY: History reviewed. No pertinent surgical history.  SOCIAL HISTORY: Social History   Socioeconomic History  . Marital status: Single    Spouse name: Not on file  . Number of children: 6  . Years of education: Not on file  . Highest education level: High school graduate  Occupational History  . Occupation: care giver  Tobacco Use  . Smoking status: Never Smoker  . Smokeless tobacco: Never Used  Vaping Use  . Vaping Use: Never used  Substance and Sexual Activity  . Alcohol  use: Yes    Comment: social  . Drug use: Never  . Sexual activity: Not Currently  Other Topics Concern  . Not on file  Social History Narrative  . Not on file   Social Determinants of Health   Financial Resource Strain:   . Difficulty of Paying Living Expenses:   Food Insecurity:   . Worried About Charity fundraiser in the Last Year:   . Arboriculturist in the Last Year:   Transportation Needs: No Transportation Needs  . Lack of Transportation (Medical): No  . Lack of Transportation (Non-Medical): No  Physical Activity:   . Days of Exercise per Week:   . Minutes of Exercise per Session:   Stress:   . Feeling of Stress :    Social Connections:   . Frequency of Communication with Friends and Family:   . Frequency of Social Gatherings with Friends and Family:   . Attends Religious Services:   . Active Member of Clubs or Organizations:   . Attends Archivist Meetings:   Marland Kitchen Marital Status:   Intimate Partner Violence:   . Fear of Current or Ex-Partner:   . Emotionally Abused:   Marland Kitchen Physically Abused:   . Sexually Abused:     FAMILY HISTORY: Family History  Problem Relation Age of Onset  . Colon cancer Mother 51  . Ovarian cancer Neg Hx   . Uterine cancer Neg Hx   . Breast cancer Neg Hx   . Cervical cancer Neg Hx     ALLERGIES:  has No Known Allergies.  MEDICATIONS:  Current Outpatient Medications  Medication Sig Dispense Refill  . docusate sodium (COLACE) 100 MG capsule Take 1 capsule (100 mg total) by mouth 2 (two) times daily as needed for mild constipation or moderate constipation. (Patient not taking: Reported on 12/02/2019) 30 capsule 0  . ferrous sulfate 325 (65 FE) MG tablet Take 1 tablet (325 mg total) by mouth daily. (Patient not taking: Reported on 12/02/2019) 30 tablet 0  . ibuprofen (ADVIL) 200 MG tablet Take 600 mg by mouth every 6 (six) hours as needed for headache or moderate pain.    Marland Kitchen lidocaine-prilocaine (EMLA) cream Apply to affected area once 30 g 3  . ondansetron (ZOFRAN) 8 MG tablet Take 1 tablet (8 mg total) by mouth every 8 (eight) hours as needed. Start on the third day after chemotherapy. 30 tablet 1  . prochlorperazine (COMPAZINE) 10 MG tablet Take 1 tablet (10 mg total) by mouth every 6 (six) hours as needed (Nausea or vomiting). 30 tablet 1   No current facility-administered medications for this visit.    REVIEW OF SYSTEMS:   Constitutional: Denies fevers, chills or abnormal night sweats Eyes: Denies blurriness of vision, double vision or watery eyes Ears, nose, mouth, throat, and face: Denies mucositis or sore throat Respiratory: Denies cough, dyspnea or  wheezes Cardiovascular: Denies palpitation, chest discomfort or lower extremity swelling Gastrointestinal:  Denies nausea, heartburn or change in bowel habits Skin: Denies abnormal skin rashes Lymphatics: Denies new lymphadenopathy or easy bruising Neurological:Denies numbness, tingling or new weaknesses Behavioral/Psych: Mood is stable, no new changes  All other systems were reviewed with the patient and are negative.  PHYSICAL EXAMINATION: ECOG PERFORMANCE STATUS: 1 - Symptomatic but completely ambulatory  Vitals:   12/14/19 1335  BP: (!) 161/85  Pulse: 86  Resp: 18  Temp: 98.2 F (36.8 C)  SpO2: 100%   Filed Weights   12/14/19 1335  Weight: 243  lb (110.2 kg)    GENERAL:alert, no distress and comfortable SKIN: skin color, texture, turgor are normal, no rashes or significant lesions EYES: normal, conjunctiva are pink and non-injected, sclera clear OROPHARYNX:no exudate, no erythema and lips, buccal mucosa, and tongue normal  NECK: supple, thyroid normal size, non-tender, without nodularity LYMPH:  no palpable lymphadenopathy in the cervical, axillary or inguinal LUNGS: clear to auscultation and percussion with normal breathing effort HEART: regular rate & rhythm and no murmurs and no lower extremity edema ABDOMEN:abdomen soft, non-tender and normal bowel sounds Musculoskeletal:no cyanosis of digits and no clubbing  PSYCH: alert & oriented x 3 with fluent speech NEURO: no focal motor/sensory deficits  LABORATORY DATA:  I have reviewed the data as listed Lab Results  Component Value Date   WBC 9.4 11/03/2019   HGB 9.9 (L) 11/03/2019   HCT 32.1 (L) 11/03/2019   MCV 80.5 11/03/2019   PLT 325 11/03/2019   Recent Labs    10/25/19 0856 11/03/19 1131  NA 139 141  K 3.6 3.7  CL 104 105  CO2 25 24  GLUCOSE 111* 104*  BUN 7 <5*  CREATININE 0.80 0.75  CALCIUM 9.0 8.9  GFRNONAA >60 >60  GFRAA >60 >60  PROT 7.5 7.4  ALBUMIN 3.3* 3.5  AST 20 18  ALT 14 12  ALKPHOS  70 62  BILITOT 0.7 0.6    RADIOGRAPHIC STUDIES: I have personally reviewed the radiological images as listed and agreed with the findings in the report. CT Abdomen Pelvis W Contrast  Result Date: 12/03/2019 CLINICAL DATA:  59 year old female with uterine/cervical cancer staging. EXAM: CT ABDOMEN AND PELVIS WITH CONTRAST TECHNIQUE: Multidetector CT imaging of the abdomen and pelvis was performed using the standard protocol following bolus administration of intravenous contrast. CONTRAST:  184mL OMNIPAQUE IOHEXOL 300 MG/ML  SOLN COMPARISON:  PET CT dated 11/29/2019. FINDINGS: Lower chest: There is several subpleural nodules at the left lung base measuring up to 5 mm (5/4). Additional smaller subpleural nodularity noted involving the right lung base. This finding is similar to prior PET CT. Clinical correlation and follow-up recommended. There is no intra-abdominal free air or free fluid. Hepatobiliary: Probable mild fatty liver. No intrahepatic biliary ductal dilatation. Tiny subcentimeter left hepatic hypodense focus is too small to characterize. There is a small gallstone. No pericholecystic fluid or evidence of acute cholecystitis by CT. Pancreas: Unremarkable. No pancreatic ductal dilatation or surrounding inflammatory changes. Spleen: Normal in size without focal abnormality. Adrenals/Urinary Tract: The adrenal glands are unremarkable. Mild right renal parenchyma atrophy and cortical scarring and irregularity. There is no hydronephrosis on either side. There is symmetric enhancement and excretion of contrast by both kidneys. The visualized ureters appear unremarkable. The urinary bladder is collapsed. There is apparent diffuse thickening of the bladder wall which may be partly related to underdistention. Cystitis is not excluded. Correlation with urinalysis recommended. Stomach/Bowel: There is no bowel obstruction or active inflammation. The appendix is normal. Vascular/Lymphatic: The abdominal aorta and  IVC are unremarkable. No portal venous gas. Mildly rounded subcentimeter aortocaval lymph node measuring approximately 8 mm (42/2). A subcentimeter mildly rounded right pelvic sidewall lymph node measures 5 mm (57/2). A peripancreatic lymph node measures approximately 1 cm in short axis (20/2). Reproductive: Cervical soft tissue mass measuring approximately 5.2 x 4.1 x 3.0 cm. There is diffuse thickening of the lower uterus with mild nodularity most consistent with infiltration. There is fluid distended endometrium consistent with cervical stenosis/obstruction. A 5.3 x 3.9 cm left fundal exophytic soft tissue  suboptimally characterized but possibly a fibroid. The ovaries are grossly unremarkable by CT. Other: Small fat containing umbilical hernia. Musculoskeletal: Degenerative changes of the spine with multilevel facet arthropathy. No acute osseous pathology. No suspicious osseous lesions. IMPRESSION: 1. Cervical soft tissue mass as described in keeping with known malignancy. 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 2. Mildly rounded subcentimeter aortocaval and right pelvic sidewall lymph nodes. 3. A 5.3 x 3.9 cm left fundal exophytic soft tissue suboptimally characterized but possibly a fibroid. 4. Cholelithiasis. 5. No bowel obstruction. Normal appendix. 6. Small bilateral lung base subpleural nodules similar to prior PET CT. Clinical correlation and follow-up recommended. Electronically Signed   By: Anner Crete M.D.   On: 12/03/2019 18:45   NM PET Image Initial (PI) Skull Base To Thigh  Result Date: 11/29/2019 CLINICAL DATA:  Initial treatment strategy for cervical cancer. EXAM: NUCLEAR MEDICINE PET SKULL BASE TO THIGH TECHNIQUE: 12.6 mCi F-18 FDG was injected intravenously. Full-ring PET imaging was performed from the skull base to thigh after the radiotracer. CT data was obtained and used for attenuation correction and anatomic localization. Fasting blood glucose:  98 mg/dl COMPARISON:  Chest radiograph 03/11/2017 FINDINGS: Mediastinal blood pool activity: SUV max 2.9 Liver activity: SUV max 3.8 NECK: Mildly asymmetric palatine tonsillar activity, maximum SUV 7.2 on the right and 5.7 cm on the left, most likely physiologic in this context. Accentuated activity along the teeth with associated periapical lucencies, favoring tooth decay/dental disease. Incidental CT findings: None CHEST: Hypermetabolic prevascular, right paratracheal, left paratracheal, AP window, subcarinal, bilateral hilar, right internal mammary, and lower thoracic para-aortic adenopathy. Index subcarinal node 1.4 cm in short axis on image 67/4, maximum SUV 14.8. Index right internal mammary lymph node 0.9 cm in short axis on image 62/4, maximum SUV 7.8. Index right lower paratracheal lymph node 1.8 cm in short axis on image 61/4, maximum SUV 15.6. Some of these lymph nodes are partially calcified. A 0.6 by 0.5 cm nodule along the right middle lobe side of the minor fissure has a maximum SUV of 1.6, this lesion is technically too small to characterize but the measurable activity does raise some concern for possible involvement. Pleural based nodules bilaterally are too small to characterize, including a 0.5 by 0.3 cm nodule along the left lower lobe on image 44/8 and small subpleural nodules along the lung apices. Surveillance imaging is likely warranted. Incidental CT findings: none ABDOMEN/PELVIS: Notably hypermetabolic large cervical mass, maximum SUV 25.8. Distended endometrial cavity. A left fundal a lesion which could be ovarian or a fundal fibroid has maximum SUV of 6.0. Peripancreatic node measuring 1.1 cm in short axis on image 100/4 has maximum SUV of 4.2, mildly above liver activity. A portacaval node measuring 0.8 cm in short axis on image 101/4 has maximum SUV of 4.9. A small lower aortocaval lymph node measuring 0.6 cm in short axis on image 127/4 has maximum SUV of 4.4. While these nodes are  mildly elevated in activity, there certainly strikingly less hypermetabolic than the thoracic lymph nodes. Incidental CT findings: Cholelithiasis. SKELETON: Heterogeneously accentuated activity in the skeleton but without definite CT correlate. L3 vertebral body maximum SUV 6.7 I am skeptical of diffuse osseous metastatic disease as a cause for this heterogeneous activity given the lack of correlate of CT findings. Incidental CT findings: Degenerative facet arthropathy in the lower lumbar spine. Mesoacromial os acromiale on the left. IMPRESSION: 1. Highly hypermetabolic cervical mass, with fluid distended endometrial cavity. Maximum SUV of the cervical mass is  25.8. 2. Left adnexal lesion versus more likely fundal fibroid, maximum SUV 6.0. Surveillance suggested. 3. There is low-grade activity in several abdominal lymph nodes including a borderline enlarged peripancreatic lymph node. However, there is striking activity in numerous enlarged thoracic lymph nodes, some of which have associated calcifications. It would be unusual to have this degree of thoracic adenopathy from cervical cancer without having a commensurate level of abdominopelvic adenopathy. This along with the calcifications of the lymph nodes raises the possibility of active granulomatous process (such as sarcoidosis) as a potential cause for the striking thoracic adenopathy. Concomitant lymphoma might also have a similar appearance. Sampling of thoracic lymph nodes may be helpful in differentiating metastatic disease other entities such as sarcoid or lymphoma. 4. Mild pleural/subpleural nodularity in the lungs, merit surveillance. 5. Cholelithiasis. Electronically Signed   By: Van Clines M.D.   On: 11/29/2019 16:59   US BREAST LTD UNI LEFT INC AXILLA  Result Date: 11/23/2019 CLINICAL DATA:  Patient was called back for focal asymmetries in the upper outer breast EXAM: DIGITAL DIAGNOSTIC LEFT MAMMOGRAM WITH TOMO ULTRASOUND LEFT BREAST  COMPARISON:  Recent screening mammography ACR Breast Density Category b: There are scattered areas of fibroglandular density. FINDINGS: The focal asymmetries in the upper outer left breast persists on additional imaging. On physical exam, no suspicious lumps are identified. Targeted ultrasound is performed, showing no sonographic correlate for the left breast focal asymmetries. IMPRESSION: Probably benign left breast focal asymmetries scattered throughout the upper outer quadrant. RECOMMENDATION: Six-month follow-up mammography of the left breast to ensure stability of the scattered focal asymmetries in the upper outer left breast. I have discussed the findings and recommendations with the patient. If applicable, a reminder letter will be sent to the patient regarding the next appointment. BI-RADS CATEGORY  3: Probably benign. Electronically Signed   By: Dorise Bullion III M.D   On: 11/23/2019 13:46   MS DIGITAL DIAG TOMO UNI LEFT  Result Date: 11/23/2019 CLINICAL DATA:  Patient was called back for focal asymmetries in the upper outer breast EXAM: DIGITAL DIAGNOSTIC LEFT MAMMOGRAM WITH TOMO ULTRASOUND LEFT BREAST COMPARISON:  Recent screening mammography ACR Breast Density Category b: There are scattered areas of fibroglandular density. FINDINGS: The focal asymmetries in the upper outer left breast persists on additional imaging. On physical exam, no suspicious lumps are identified. Targeted ultrasound is performed, showing no sonographic correlate for the left breast focal asymmetries. IMPRESSION: Probably benign left breast focal asymmetries scattered throughout the upper outer quadrant. RECOMMENDATION: Six-month follow-up mammography of the left breast to ensure stability of the scattered focal asymmetries in the upper outer left breast. I have discussed the findings and recommendations with the patient. If applicable, a reminder letter will be sent to the patient regarding the next appointment. BI-RADS  CATEGORY  3: Probably benign. Electronically Signed   By: Dorise Bullion III M.D   On: 11/23/2019 13:46

## 2019-12-14 NOTE — Progress Notes (Signed)
Met with Anne Harris and discussed plan for chemotherapy and radiation.  Provided her with the [facility] folder and advised her to call with any questions.

## 2019-12-15 ENCOUNTER — Other Ambulatory Visit: Payer: Self-pay

## 2019-12-15 ENCOUNTER — Ambulatory Visit (HOSPITAL_COMMUNITY): Payer: BC Managed Care – PPO | Admitting: Certified Registered Nurse Anesthetist

## 2019-12-15 ENCOUNTER — Observation Stay (HOSPITAL_COMMUNITY)
Admission: RE | Admit: 2019-12-15 | Discharge: 2019-12-16 | Disposition: A | Discharge status: Home / Self Care | Payer: BC Managed Care – PPO | Attending: Internal Medicine | Admitting: Internal Medicine

## 2019-12-15 ENCOUNTER — Encounter (HOSPITAL_COMMUNITY): Payer: Self-pay | Admitting: Pulmonary Disease

## 2019-12-15 ENCOUNTER — Encounter: Payer: No Typology Code available for payment source | Admitting: Thoracic Surgery (Cardiothoracic Vascular Surgery)

## 2019-12-15 ENCOUNTER — Encounter (HOSPITAL_COMMUNITY)
Admission: RE | Disposition: A | Discharge status: Home / Self Care | Payer: Self-pay | Source: Home / Self Care | Attending: Internal Medicine

## 2019-12-15 DIAGNOSIS — C539 Malignant neoplasm of cervix uteri, unspecified: Secondary | ICD-10-CM | POA: Diagnosis not present

## 2019-12-15 DIAGNOSIS — D638 Anemia in other chronic diseases classified elsewhere: Secondary | ICD-10-CM

## 2019-12-15 DIAGNOSIS — I160 Hypertensive urgency: Secondary | ICD-10-CM | POA: Diagnosis present

## 2019-12-15 DIAGNOSIS — Z20822 Contact with and (suspected) exposure to covid-19: Secondary | ICD-10-CM | POA: Diagnosis not present

## 2019-12-15 DIAGNOSIS — I10 Essential (primary) hypertension: Secondary | ICD-10-CM | POA: Diagnosis not present

## 2019-12-15 DIAGNOSIS — D649 Anemia, unspecified: Secondary | ICD-10-CM | POA: Insufficient documentation

## 2019-12-15 DIAGNOSIS — D860 Sarcoidosis of lung: Secondary | ICD-10-CM | POA: Insufficient documentation

## 2019-12-15 DIAGNOSIS — R590 Localized enlarged lymph nodes: Secondary | ICD-10-CM | POA: Diagnosis not present

## 2019-12-15 DIAGNOSIS — Z6841 Body Mass Index (BMI) 40.0 and over, adult: Secondary | ICD-10-CM | POA: Diagnosis not present

## 2019-12-15 HISTORY — PX: ENDOBRONCHIAL ULTRASOUND: SHX5096

## 2019-12-15 HISTORY — DX: Presence of spectacles and contact lenses: Z97.3

## 2019-12-15 HISTORY — DX: Localized enlarged lymph nodes: R59.0

## 2019-12-15 HISTORY — PX: BRONCHIAL BIOPSY: SHX5109

## 2019-12-15 HISTORY — PX: BRONCHIAL NEEDLE ASPIRATION BIOPSY: SHX5106

## 2019-12-15 HISTORY — PX: BRONCHIAL WASHINGS: SHX5105

## 2019-12-15 LAB — COMPREHENSIVE METABOLIC PANEL
ALT: 13 U/L (ref 0–44)
AST: 19 U/L (ref 15–41)
Albumin: 3.5 g/dL (ref 3.5–5.0)
Alkaline Phosphatase: 63 U/L (ref 38–126)
Anion gap: 12 (ref 5–15)
BUN: 6 mg/dL (ref 6–20)
CO2: 23 mmol/L (ref 22–32)
Calcium: 9.2 mg/dL (ref 8.9–10.3)
Chloride: 106 mmol/L (ref 98–111)
Creatinine, Ser: 0.9 mg/dL (ref 0.44–1.00)
GFR calc Af Amer: >60 mL/min (ref >60)
GFR calc non Af Amer: >60 mL/min (ref >60)
Glucose, Bld: 88 mg/dL (ref 70–99)
Potassium: 3.5 mmol/L (ref 3.5–5.1)
Sodium: 141 mmol/L (ref 135–145)
Total Bilirubin: 0.5 mg/dL (ref 0.3–1.2)
Total Protein: 7.2 g/dL (ref 6.5–8.1)

## 2019-12-15 LAB — CBC
HCT: 33.6 % — ABNORMAL LOW (ref 36.0–46.0)
Hemoglobin: 9.9 g/dL — ABNORMAL LOW (ref 12.0–15.0)
MCH: 23.2 pg — ABNORMAL LOW (ref 26.0–34.0)
MCHC: 29.5 g/dL — ABNORMAL LOW (ref 30.0–36.0)
MCV: 78.9 fL — ABNORMAL LOW (ref 80.0–100.0)
Platelets: 342 10*3/uL (ref 150–400)
RBC: 4.26 MIL/uL (ref 3.87–5.11)
RDW: 14.6 % (ref 11.5–15.5)
WBC: 8.4 10*3/uL (ref 4.0–10.5)
nRBC: 0 % (ref 0.0–0.2)

## 2019-12-15 LAB — RETICULOCYTES
Immature Retic Fract: 11.4 % (ref 2.3–15.9)
RBC.: 4.5 MIL/uL (ref 3.87–5.11)
Retic Count, Absolute: 52.2 10*3/uL (ref 19.0–186.0)
Retic Ct Pct: 1.2 % (ref 0.4–3.1)

## 2019-12-15 LAB — FERRITIN: Ferritin: 9 ng/mL — ABNORMAL LOW (ref 11–307)

## 2019-12-15 LAB — BODY FLUID CELL COUNT WITH DIFFERENTIAL
Eos, Fluid: 0 %
Lymphs, Fluid: 22 %
Monocyte-Macrophage-Serous Fluid: 68 % (ref 50–90)
Neutrophil Count, Fluid: 10 % (ref 0–25)
Total Nucleated Cell Count, Fluid: 235 cu mm (ref 0–1000)

## 2019-12-15 LAB — IRON AND TIBC
Iron: 21 ug/dL — ABNORMAL LOW (ref 28–170)
Saturation Ratios: 5 % — ABNORMAL LOW (ref 10.4–31.8)
TIBC: 407 ug/dL (ref 250–450)
UIBC: 386 ug/dL

## 2019-12-15 LAB — PROTIME-INR
INR: 1.1 (ref 0.8–1.2)
Prothrombin Time: 13.4 seconds (ref 11.4–15.2)

## 2019-12-15 LAB — FOLATE: Folate: 15.5 ng/mL (ref >5.9)

## 2019-12-15 LAB — HIV ANTIBODY (ROUTINE TESTING W REFLEX): HIV Screen 4th Generation wRfx: NONREACTIVE

## 2019-12-15 LAB — VITAMIN B12: Vitamin B-12: 245 pg/mL (ref 180–914)

## 2019-12-15 LAB — APTT: aPTT: 27 seconds (ref 24–36)

## 2019-12-15 SURGERY — ENDOBRONCHIAL ULTRASOUND (EBUS)
Anesthesia: General

## 2019-12-15 MED ORDER — LABETALOL HCL 5 MG/ML IV SOLN: 20.0000 mg | Freq: Once | INTRAVENOUS | Status: AC

## 2019-12-15 MED ORDER — FENTANYL CITRATE (PF) 100 MCG/2ML IJ SOLN
INTRAMUSCULAR | Status: AC
  Filled 2019-12-15: qty 2

## 2019-12-15 MED ORDER — PROMETHAZINE HCL 25 MG/ML IJ SOLN: 6.2500 mg | INTRAMUSCULAR | Status: DC | PRN

## 2019-12-15 MED ORDER — HYDROCHLOROTHIAZIDE 25 MG PO TABS
25.0000 mg | ORAL_TABLET | Freq: Every day | ORAL | Status: DC
  Administered 2019-12-15 – 2019-12-16 (×2): 25 mg via ORAL
  Filled 2019-12-15 (×4): qty 1

## 2019-12-15 MED ORDER — MIDAZOLAM HCL 5 MG/5ML IJ SOLN
INTRAMUSCULAR | Status: DC | PRN
  Administered 2019-12-15: 2 mg via INTRAVENOUS

## 2019-12-15 MED ORDER — LACTATED RINGERS IV SOLN: INTRAVENOUS | Status: DC

## 2019-12-15 MED ORDER — HYDRALAZINE HCL 20 MG/ML IJ SOLN: 10.0000 mg | INTRAMUSCULAR | Status: DC | PRN

## 2019-12-15 MED ORDER — MIDAZOLAM HCL 2 MG/2ML IJ SOLN
INTRAMUSCULAR | Status: AC
  Filled 2019-12-15: qty 2

## 2019-12-15 MED ORDER — LABETALOL HCL 5 MG/ML IV SOLN
INTRAVENOUS | Status: AC
  Filled 2019-12-15: qty 4

## 2019-12-15 MED ORDER — SUGAMMADEX SODIUM 200 MG/2ML IV SOLN
INTRAVENOUS | Status: DC | PRN
  Administered 2019-12-15: 200 mg via INTRAVENOUS

## 2019-12-15 MED ORDER — ONDANSETRON HCL 4 MG/2ML IJ SOLN
INTRAMUSCULAR | Status: DC | PRN
  Administered 2019-12-15: 4 mg via INTRAVENOUS

## 2019-12-15 MED ORDER — ROCURONIUM BROMIDE 10 MG/ML (PF) SYRINGE
PREFILLED_SYRINGE | INTRAVENOUS | Status: DC | PRN
  Administered 2019-12-15: 60 mg via INTRAVENOUS

## 2019-12-15 MED ORDER — HYDRALAZINE HCL 25 MG PO TABS
25.0000 mg | ORAL_TABLET | Freq: Three times a day (TID) | ORAL | Status: DC
  Administered 2019-12-15 – 2019-12-16 (×3): 25 mg via ORAL
  Filled 2019-12-15 (×3): qty 1

## 2019-12-15 MED ORDER — ESMOLOL HCL 100 MG/10ML IV SOLN
INTRAVENOUS | Status: DC | PRN
Start: 2019-12-15 — End: 2019-12-15
  Administered 2019-12-15: 30 mg via INTRAVENOUS
  Administered 2019-12-15 (×2): 20 mg via INTRAVENOUS

## 2019-12-15 MED ORDER — PROPOFOL 10 MG/ML IV BOLUS
INTRAVENOUS | Status: DC | PRN
  Administered 2019-12-15: 150 mg via INTRAVENOUS
  Administered 2019-12-15: 20 mg via INTRAVENOUS

## 2019-12-15 MED ORDER — HEPARIN SODIUM (PORCINE) 5000 UNIT/ML IJ SOLN: 5000.0000 [IU] | Freq: Three times a day (TID) | INTRAMUSCULAR | Status: DC

## 2019-12-15 MED ORDER — CARVEDILOL 12.5 MG PO TABS
12.5000 mg | ORAL_TABLET | Freq: Two times a day (BID) | ORAL | Status: DC
  Administered 2019-12-15: 12.5 mg via ORAL
  Filled 2019-12-15 (×3): qty 1

## 2019-12-15 MED ORDER — LABETALOL HCL 5 MG/ML IV SOLN
10.0000 mg | Freq: Once | INTRAVENOUS | Status: AC
  Administered 2019-12-15: 10 mg via INTRAVENOUS

## 2019-12-15 MED ORDER — LABETALOL HCL 5 MG/ML IV SOLN
INTRAVENOUS | Status: AC
  Administered 2019-12-15: 20 mg via INTRAVENOUS
  Filled 2019-12-15: qty 4

## 2019-12-15 MED ORDER — CHLORHEXIDINE GLUCONATE 0.12 % MT SOLN
OROMUCOSAL | Status: AC
  Administered 2019-12-15: 15 mL via OROMUCOSAL
  Filled 2019-12-15: qty 15

## 2019-12-15 MED ORDER — DEXAMETHASONE SODIUM PHOSPHATE 10 MG/ML IJ SOLN
INTRAMUSCULAR | Status: DC | PRN
  Administered 2019-12-15: 10 mg via INTRAVENOUS

## 2019-12-15 MED ORDER — LIDOCAINE 2% (20 MG/ML) 5 ML SYRINGE
INTRAMUSCULAR | Status: DC | PRN
  Administered 2019-12-15: 20 mg via INTRAVENOUS

## 2019-12-15 MED ORDER — FENTANYL CITRATE (PF) 100 MCG/2ML IJ SOLN: 25.0000 ug | INTRAMUSCULAR | Status: DC | PRN

## 2019-12-15 MED ORDER — OXYCODONE HCL 5 MG PO TABS: 5.0000 mg | ORAL_TABLET | ORAL | Status: DC | PRN

## 2019-12-15 MED ORDER — FENTANYL CITRATE (PF) 100 MCG/2ML IJ SOLN
INTRAMUSCULAR | Status: DC | PRN
  Administered 2019-12-15: 100 ug via INTRAVENOUS

## 2019-12-15 MED ORDER — ACETAMINOPHEN 650 MG RE SUPP: 650.0000 mg | Freq: Four times a day (QID) | RECTAL | Status: DC | PRN

## 2019-12-15 MED ORDER — HYDRALAZINE HCL 20 MG/ML IJ SOLN
10.0000 mg | Freq: Once | INTRAMUSCULAR | Status: AC
  Administered 2019-12-15: 10 mg via INTRAVENOUS

## 2019-12-15 MED ORDER — ALBUTEROL SULFATE (2.5 MG/3ML) 0.083% IN NEBU: 2.5000 mg | INHALATION_SOLUTION | RESPIRATORY_TRACT | Status: DC | PRN

## 2019-12-15 MED ORDER — HYDRALAZINE HCL 20 MG/ML IJ SOLN
INTRAMUSCULAR | Status: AC
  Filled 2019-12-15: qty 1

## 2019-12-15 MED ORDER — ACETAMINOPHEN 325 MG PO TABS
650.0000 mg | ORAL_TABLET | Freq: Four times a day (QID) | ORAL | Status: DC | PRN
  Administered 2019-12-15 – 2019-12-16 (×2): 650 mg via ORAL
  Filled 2019-12-15 (×2): qty 2

## 2019-12-15 MED ORDER — CHLORHEXIDINE GLUCONATE 0.12 % MT SOLN: 15.0000 mL | OROMUCOSAL | Status: AC

## 2019-12-15 NOTE — Consult Note (Signed)
NAME:  Anne Harris, MRN:  [id number], DOB:  25-Jun-1960, LOS: 0 ADMISSION DATE:  12/15/2019, CONSULTATION DATE: 12/15/2019 REFERRING MD: Dr. Alvy Bimler, CHIEF COMPLAINT: Mediastinal adenopathy abnormal pet imaging   History of present illness   59 year old female with a recent diagnosis of squamous cell carcinoma of the cervix.  Past medical history of hypertension and obesity.  Patient was referred for evaluation of abnormal PET scan imaging of the chest to include PET avid mediastinal adenopathy.  Patient presents today for outpatient video bronchoscopy with endobronchial ultrasound and transbronchial needle aspirations for tissue sampling of the mediastinal adenopathy seen on imaging.  We discussed this today at patient's bedside.  We discussed risk benefits and alternatives of proceeding with video bronchoscopy with transbronchial needle aspirations to include bleeding, pneumothorax and death.  No barriers to proceed.  Patient is not on any anticoagulation.  In preop patient's systolic blood pressures were approximately 200.  This was discussed with anesthesia.  Decision was made to go ahead and proceed with procedure.  She will need outpatient follow-up with primary care for management of hypertension.  Past Medical History   Past Medical History:  Diagnosis Date  . Abnormal Pap smear of cervix   . Cervical cancer ([place])   . Hypertension   . Mediastinal adenopathy   . Obesity, Class III, BMI 40-49.9 (morbid obesity) ([place])   . Wears glasses      Objective   Blood pressure (!) 214/87, pulse 92, temperature 98.8 F (37.1 C), temperature source Oral, resp. rate 18, height 5\' 4"  (1.626 m), weight 110.2 kg, SpO2 100 %.       No intake or output data in the 24 hours ending 12/15/19 1225 Filed Weights   12/15/19 1118  Weight: 110.2 kg    Examination: General: Elderly female resting comfortably bed no distress HENT: Appropriately, NCAT Lungs: Clear to auscultation  bilaterally Cardiovascular: Regular rate rhythm S1-S2 Abdomen: Obese, soft nontender nondistended Extremities: No significant edema, no rash Neuro: All 4 extremities no focal deficit GU: Deferred  Assessment & Plan:   Recent diagnosis of squamous cell carcinoma of the cervix PET avid mediastinal adenopathy Plan: Discussed risk benefits and alternatives of proceeding with video bronchoscopy transbronchial needle aspirations of mediastinal adenopathy. Plan for video bronchoscopy with endobronchial ultrasound. Discussed risk of bleeding, pneumothorax and death. Patient is agreeable to proceed.  No barriers.  I have personally reviewed patient's PET scan imaging as well as CT imaging of the chest.  This reveals PET avid adenopathy as described above. The patient's images have been independently reviewed by me.     Labs   CBC: Recent Labs  Lab 12/15/19 1207  WBC 8.4  HGB 9.9*  HCT 33.6*  MCV 78.9*  PLT 494    Basic Metabolic Panel: No results for input(s): NA, K, CL, CO2, GLUCOSE, BUN, CREATININE, CALCIUM, MG, PHOS in the last 168 hours. GFR: CrCl cannot be calculated (Patient's most recent lab result is older than the maximum 21 days allowed.). Recent Labs  Lab 12/15/19 1207  WBC 8.4    Liver Function Tests: No results for input(s): AST, ALT, ALKPHOS, BILITOT, PROT, ALBUMIN in the last 168 hours. No results for input(s): LIPASE, AMYLASE in the last 168 hours. No results for input(s): AMMONIA in the last 168 hours.  ABG    Component Value Date/Time   TCO2 27 09/25/2010 1500     Coagulation Profile: No results for input(s): INR, PROTIME in the last 168 hours.  Cardiac Enzymes: No results  for input(s): CKTOTAL, CKMB, CKMBINDEX, TROPONINI in the last 168 hours.  HbA1C: No results found for: HGBA1C  CBG: No results for input(s): GLUCAP in the last 168 hours.  Review of Systems:   Review of Systems  Constitutional: Negative for chills, fever, malaise/fatigue  and weight loss.  HENT: Negative for hearing loss, sore throat and tinnitus.   Eyes: Negative for blurred vision and double vision.  Respiratory: Negative for cough, hemoptysis, sputum production, shortness of breath, wheezing and stridor.   Cardiovascular: Negative for chest pain, palpitations, orthopnea, leg swelling and PND.  Gastrointestinal: Negative for abdominal pain, constipation, diarrhea, heartburn, nausea and vomiting.  Genitourinary: Negative for dysuria, hematuria and urgency.  Musculoskeletal: Negative for joint pain and myalgias.  Skin: Negative for itching and rash.  Neurological: Negative for dizziness, tingling, weakness and headaches.  Endo/Heme/Allergies: Negative for environmental allergies. Does not bruise/bleed easily.  Psychiatric/Behavioral: Negative for depression. The patient is nervous/anxious. The patient does not have insomnia.   All other systems reviewed and are negative.    Past Medical History  She,  has a past medical history of Abnormal Pap smear of cervix, Cervical cancer ([place]), Hypertension, Mediastinal adenopathy, Obesity, Class III, BMI 40-49.9 (morbid obesity) ([place]), and Wears glasses.   Surgical History   History reviewed. No pertinent surgical history.   Social History   reports that she has never smoked. She has never used smokeless tobacco. She reports current alcohol use. She reports that she does not use drugs.   Family History   Her family history includes Colon cancer (age of onset: 64) in her mother. There is no history of Ovarian cancer, Uterine cancer, Breast cancer, or Cervical cancer.   Allergies No Known Allergies   Home Medications  Prior to Admission medications   Medication Sig Start Date End Date Taking? Authorizing Provider  ibuprofen (ADVIL) 200 MG tablet Take 600 mg by mouth every 6 (six) hours as needed for headache or moderate pain.   Yes [provider]  docusate sodium (COLACE) 100 MG capsule Take 1 capsule  (100 mg total) by mouth 2 (two) times daily as needed for mild constipation or moderate constipation. Patient not taking: Reported on 12/02/2019 11/03/19   Caccavale, Sophia, PA-C  ferrous sulfate 325 (65 FE) MG tablet Take 1 tablet (325 mg total) by mouth daily. Patient not taking: Reported on 12/02/2019 11/03/19   Caccavale, Sophia, PA-C  lidocaine-prilocaine (EMLA) cream Apply to affected area once 12/14/19   Heath Lark, MD  ondansetron (ZOFRAN) 8 MG tablet Take 1 tablet (8 mg total) by mouth every 8 (eight) hours as needed. Start on the third day after chemotherapy. 12/14/19   Heath Lark, MD  prochlorperazine (COMPAZINE) 10 MG tablet Take 1 tablet (10 mg total) by mouth every 6 (six) hours as needed (Nausea or vomiting). 12/14/19   Heath Lark, MD      [facility] 12/15/2019 12:25 PM

## 2019-12-15 NOTE — Progress Notes (Signed)
Patient received a bed on 5W37 called report to RN from Endo unit.

## 2019-12-15 NOTE — Anesthesia Preprocedure Evaluation (Addendum)
Anesthesia Evaluation  Patient identified by MRN, date of birth, ID band Patient awake    Reviewed: Allergy & Precautions, NPO status , Patient's Chart, lab work & pertinent test results  Airway Mallampati: II  TM Distance: >3 FB Neck ROM: Full    Dental no notable dental hx. (+) Poor Dentition, Dental Advisory Given,    Pulmonary  Mediastinal adenopathy in the setting of recent diagnosis of cervical ca   Pulmonary exam normal breath sounds clear to auscultation       Cardiovascular hypertension, Normal cardiovascular exam Rhythm:Regular Rate:Normal  HTN not on any meds for a long time per patient. Hypertensive to 287G systolic consistently this morning   Neuro/Psych negative neurological ROS  negative psych ROS   GI/Hepatic negative GI ROS, Neg liver ROS,   Endo/Other  Morbid obesityBMI 42  Renal/GU negative Renal ROS  Female GU complaint Hx cervical ca    Musculoskeletal negative musculoskeletal ROS (+)   Abdominal (+) + obese,   Peds negative pediatric ROS (+)  Hematology  (+) Blood dyscrasia, anemia , hct 32   Anesthesia Other Findings   Reproductive/Obstetrics negative OB ROS                            Anesthesia Physical Anesthesia Plan  ASA: III  Anesthesia Plan: General   Post-op Pain Management:    Induction: Intravenous  PONV Risk Score and Plan: 3 and Ondansetron, Dexamethasone, Midazolam and Treatment may vary due to age or medical condition  Airway Management Planned: Oral ETT  Additional Equipment: None  Intra-op Plan:   Post-operative Plan: Extubation in OR  Informed Consent: I have reviewed the patients History and Physical, chart, labs and discussed the procedure including the risks, benefits and alternatives for the proposed anesthesia with the patient or authorized representative who has indicated his/her understanding and acceptance.     Dental  advisory given  Plan Discussed with: CRNA  Anesthesia Plan Comments: (Very hypertensive, d/w Dr. Valeta Harms and decision to proceed as this procedure is critical for her cancer staging and  in order to start treatment. )       Anesthesia Quick Evaluation

## 2019-12-15 NOTE — Interval H&P Note (Signed)
History and Physical Interval Note:  12/15/2019 12:30 PM  Anne Harris  has presented today for surgery, with the diagnosis of mediastinal adenopathy.  The various methods of treatment have been discussed with the patient and family. After consideration of risks, benefits and other options for treatment, the patient has consented to  Procedure(s): ENDOBRONCHIAL ULTRASOUND (N/A) as a surgical intervention.  The patient's history has been reviewed, patient examined, no change in status, stable for surgery.  I have reviewed the patient's chart and labs.  Questions were answered to the patient's satisfaction.     [place]

## 2019-12-15 NOTE — Transfer of Care (Signed)
Immediate Anesthesia Transfer of Care Note  Patient: Anne Harris  Procedure(s) Performed: ENDOBRONCHIAL ULTRASOUND (N/A ) BRONCHIAL NEEDLE ASPIRATION BIOPSIES BRONCHIAL BIOPSIES BRONCHIAL WASHINGS  Patient Location: PACU  Anesthesia Type:General  Level of Consciousness: awake and alert   Airway & Oxygen Therapy: Patient Spontanous Breathing and Patient connected to nasal cannula oxygen  Post-op Assessment: Report given to RN and Post -op Vital signs reviewed and stable  Post vital signs: Reviewed and stable  Last Vitals:  Vitals Value Taken Time  BP 202/85 12/15/19 1552  Temp    Pulse 89 12/15/19 1554  Resp 17 12/15/19 1554  SpO2 99 % 12/15/19 1554  Vitals shown include unvalidated device data.  Last Pain:  Vitals:   12/15/19 1137  TempSrc:   PainSc: 0-No pain      Patients Stated Pain Goal: 3 (81/27/51 7001)  Complications: No complications documented.

## 2019-12-15 NOTE — Progress Notes (Signed)
Pts O2 saturation maintaining 88% on RA, pt placed on 2L [place], now maintaining 95%. No other needs at this time, will CTM.

## 2019-12-15 NOTE — H&P (Signed)
TRH H&P   Patient Demographics:    Anne Harris, is a 59 y.o. female  MRN: [id number]   DOB - 05/16/1961  Admit Date - 12/15/2019  Outpatient Primary MD for the patient is Constant, Vickii Chafe, MD  Referring MD/NP/PA: Dr Valeta Harms  Patient coming from: Home>bronchoscopy.  No chief complaint on file.     HPI:    Anne Harris  is a 59 y.o. female, with recent diagnosis of squamous cell carcinoma of the cervix, otherwise she is unaware of any past medical history as she has not been following with any PCP in the past, patient was diagnosed with cervical squamous cell cancer as part of evaluation of screening process for breast and cervical malignancy, she was referred to oncology, where she had a PET scan done, which was abnormal PET scan imaging of the chest, where she had evidence of mediastinal adenopathy, patient was referred to pulmonary for outpatient video bronchoscopy with endobronchial ultrasound and transbronchial needle aspiration for tissue sampling of the mediastinal adenopathy, patient did tolerate the procedure very well today, currently she denies any shortness of breath, chest pain, hemoptysis, fever, chills, nausea or vomiting. -In bronchoscopy suite patient was noted to have uncontrolled blood pressure, by reviewing previous records she had uncontrolled blood pressure as well, she does deny chest pain, shortness of breath, headache, dizziness, or any vision changes, she does report she does not she has elevated blood pressure problem, but never followed up on that, her blood pressure was 247/98, she did receive multiple IV hydralazine pushes, and labetalol pushes, with some improvement, but overall remains elevated more than 200, so triage hospitalist were consulted to admit for blood pressure management and control.    Review of systems:    In addition to the HPI above, No  Fever-chills, No Headache, No changes with Vision or hearing, No problems swallowing food or Liquids, No Chest pain, Cough or Shortness of Breath, No Abdominal pain, No Nausea or Vommitting, Bowel movements are regular, No Blood in stool or Urine, No dysuria, No new skin rashes or bruises, No new joints pains-aches,  No new weakness, tingling, numbness in any extremity, No recent weight gain or loss, No polyuria, polydypsia or polyphagia, No significant Mental Stressors.  A full 10 point Review of Systems was done, except as stated above, all other Review of Systems were negative.   With Past History of the following :    Past Medical History:  Diagnosis Date  . Abnormal Pap smear of cervix   . Cervical cancer ([place])   . Hypertension   . Mediastinal adenopathy   . Obesity, Class III, BMI 40-49.9 (morbid obesity) ([place])   . Wears glasses       History reviewed. No pertinent surgical history.    Social History:     Social History   Tobacco Use  . Smoking status: Never Smoker  . Smokeless tobacco:  Never Used  Substance Use Topics  . Alcohol use: Yes    Comment: social       Family History :     Family History  Problem Relation Age of Onset  . Colon cancer Mother 27  . Ovarian cancer Neg Hx   . Uterine cancer Neg Hx   . Breast cancer Neg Hx   . Cervical cancer Neg Hx       Home Medications:   Prior to Admission medications   Medication Sig Start Date End Date Taking? Authorizing Provider  ibuprofen (ADVIL) 200 MG tablet Take 600 mg by mouth every 6 (six) hours as needed for headache or moderate pain.   Yes [provider]  docusate sodium (COLACE) 100 MG capsule Take 1 capsule (100 mg total) by mouth 2 (two) times daily as needed for mild constipation or moderate constipation. Patient not taking: Reported on 12/02/2019 11/03/19   Caccavale, Sophia, PA-C  ferrous sulfate 325 (65 FE) MG tablet Take 1 tablet (325 mg total) by mouth daily. Patient not  taking: Reported on 12/02/2019 11/03/19   Caccavale, Sophia, PA-C  lidocaine-prilocaine (EMLA) cream Apply to affected area once 12/14/19   Heath Lark, MD  ondansetron (ZOFRAN) 8 MG tablet Take 1 tablet (8 mg total) by mouth every 8 (eight) hours as needed. Start on the third day after chemotherapy. 12/14/19   Heath Lark, MD  prochlorperazine (COMPAZINE) 10 MG tablet Take 1 tablet (10 mg total) by mouth every 6 (six) hours as needed (Nausea or vomiting). 12/14/19   Heath Lark, MD     Allergies:    No Known Allergies   Physical Exam:   Vitals  Blood pressure (!) 247/104, pulse 80, temperature (!) 96 F (35.6 C), temperature source Axillary, resp. rate 14, height 5\' 4"  (1.626 m), weight 110.2 kg, SpO2 96 %.   1. General well developed female, lying in bed in NAD,   2. Normal affect and insight, Not Suicidal or Homicidal, Awake Alert, Oriented X 3.  3. No F.N deficits, ALL C.Nerves Intact, Strength 5/5 all 4 extremities, Sensation intact all 4 extremities, Plantars down going.  4. Ears and Eyes appear Normal, Conjunctivae clear, PERRLA. Moist Oral Mucosa.  5. Supple Neck, No JVD, No cervical lymphadenopathy appriciated, No Carotid Bruits.  6. Symmetrical Chest wall movement, Good air movement bilaterally, CTAB.  7. RRR, No Gallops, Rubs or Murmurs, No Parasternal Heave.  8. Positive Bowel Sounds, Abdomen Soft, No tenderness, No organomegaly appriciated,No rebound -guarding or rigidity.  9.  No Cyanosis, Normal Skin Turgor, No Skin Rash or Bruise.  10. Good muscle tone,  joints appear normal , no effusions, Normal ROM.       Data Review:    CBC Recent Labs  Lab 12/15/19 1207  WBC 8.4  HGB 9.9*  HCT 33.6*  PLT 342  MCV 78.9*  MCH 23.2*  MCHC 29.5*  RDW 14.6   ------------------------------------------------------------------------------------------------------------------  Chemistries  Recent Labs  Lab 12/15/19 1207  NA 141  K 3.5  CL 106  CO2 23  GLUCOSE 88    BUN 6  CREATININE 0.90  CALCIUM 9.2  AST 19  ALT 13  ALKPHOS 63  BILITOT 0.5   ------------------------------------------------------------------------------------------------------------------ estimated creatinine clearance is 80.7 mL/min (by C-G formula based on SCr of 0.9 mg/dL). ------------------------------------------------------------------------------------------------------------------ No results for input(s): TSH, T4TOTAL, T3FREE, THYROIDAB in the last 72 hours.  Invalid input(s): FREET3  Coagulation profile Recent Labs  Lab 12/15/19 1207  INR 1.1   -------------------------------------------------------------------------------------------------------------------  No results for input(s): DDIMER in the last 72 hours. -------------------------------------------------------------------------------------------------------------------  Cardiac Enzymes No results for input(s): CKMB, TROPONINI, MYOGLOBIN in the last 168 hours.  Invalid input(s): CK ------------------------------------------------------------------------------------------------------------------ No results found for: BNP   ---------------------------------------------------------------------------------------------------------------  Urinalysis    Component Value Date/Time   COLORURINE AMBER (A) 11/03/2019 1223   APPEARANCEUR HAZY (A) 11/03/2019 1223   LABSPEC 1.023 11/03/2019 1223   PHURINE 5.0 11/03/2019 1223   GLUCOSEU NEGATIVE 11/03/2019 1223   HGBUR LARGE (A) 11/03/2019 1223   BILIRUBINUR NEGATIVE 11/03/2019 1223   KETONESUR 20 (A) 11/03/2019 1223   PROTEINUR 100 (A) 11/03/2019 1223   UROBILINOGEN 0.2 04/03/2008 0800   NITRITE NEGATIVE 11/03/2019 1223   LEUKOCYTESUR LARGE (A) 11/03/2019 1223    ----------------------------------------------------------------------------------------------------------------   Imaging Results:    No results found.  No EKG was done, will order 1 for  baseline   Assessment & Plan:    Active Problems:   Obesity, Class III, BMI 40-49.9 (morbid obesity) ([facility])   Anemia due to chronic illness   Mediastinal adenopathy   Hypertension   Uncontrolled hypertension    Uncontrolled hypertension -It seems to be chronic problem, patient had follow-up with PCP, she was counseled about diet, and salt restriction. -Patient was started on Coreg 12.5 mg p.o. twice daily, and hydrochlorothiazide 25 mg oral daily, which will be given now, as well I will schedule her on hydralazine 25 mg p.o. 3 times daily, first dose at 10 PM if blood pressure remains elevated (SBP> 160), as well meanwhile we will continue with as needed hydralazine and as needed labetalol.  Recent diagnosis of cervical carcinoma with mediastinal adenopathy. -She is following with Dr. Alvy Bimler, and she is status post bronchoscopy and biopsy today by PCCM, this is to be followed by her primary team.  Anemia due to chronic illness -Continue with iron supplement  Obesity with BMI of 41.71    DVT Prophylaxis  SCDs   AM Labs Ordered, also please review Full Orders  Family Communication: Admission, patients condition and plan of care including tests being ordered have been discussed with the patient who indicate understanding and agree with the plan and Code Status.  Code Status Full  Likely DC to  Home  Condition GUARDED    Consults called: None  Admission status: Observation  Time spent in minutes : 45 minutes   Phillips Climes M.D on 12/15/2019 at 5:01 PM   [facility] - Office  [phone]

## 2019-12-15 NOTE — Op Note (Signed)
Video Bronchoscopy with Endobronchial Ultrasound Procedure Note  Date of Operation: 12/15/2019  Pre-op Diagnosis: mediastinal adenopathy   Post-op Diagnosis: mediastinal adenopathy   Surgeon: Garner Nash, DO   Assistants: none   Anesthesia: General endotracheal anesthesia  Operation: Flexible video fiberoptic bronchoscopy with endobronchial ultrasound and biopsies.  Estimated Blood Loss: Minimal  Complications: none   Indications and History: Anne Harris is a 59 y.o. female with mediastinal adenopathy.  The risks, benefits, complications, treatment options and expected outcomes were discussed with the patient.  The possibilities of pneumothorax, pneumonia, reaction to medication, pulmonary aspiration, perforation of a viscus, bleeding, failure to diagnose a condition and creating a complication requiring transfusion or operation were discussed with the patient who freely signed the consent.    Description of Procedure: The patient was examined in the preoperative area and history and data from the preprocedure consultation were reviewed. It was deemed appropriate to proceed.  The patient was taken to [facility] endoscopy room 2, identified as TABBY BEASTON and the procedure verified as Flexible Video Fiberoptic Bronchoscopy.  A Time Out was held and the above information confirmed. After being taken to the operating room general anesthesia was initiated and the patient  was orally intubated. The video fiberoptic bronchoscope was introduced via the endotracheal tube and a general inspection was performed which showed normal right and left lung anatomy, faint irregularities along the anterior wall of both mainstems. The standard scope was then withdrawn and the endobronchial ultrasound was used to identify and characterize the peritracheal, hilar and bronchial lymph nodes. Inspection showed enlarged station 4, hilar and station 7 nodes. Using real-time ultrasound guidance Wang needle  biopsies were take from Station 7 nodes and were sent for cytology as well as tissue culture. We had 10 separate needle passes into station 7. Following EBUS we completed endobronchial biopsies of the right left mainstem anterior wall. We also completed a BAL to the RUL for cell count andcd4/cd8 ratio. The patient tolerated the procedure well without apparent complications. There was no significant blood loss. The bronchoscope was withdrawn. Anesthesia was reversed and the patient was taken to the PACU for recovery.   Samples: 1. Wang needle biopsies from 7 node 2. Endobronchial forcep biopsies 3. RUL BAL   Plans:  The patient will be discharged from the PACU to home when recovered from anesthesia. We will review the cytology, pathology and microbiology results with the patient when they become available. Outpatient followup will be with Octavio Graves Graelyn Bihl, DO.   She is hypertensive following case. We will attempt to control her BP prior to leaving. If not she may need to stay for observation to obtain best BP control.   Garner Nash, DO [facility] 12/15/2019 3:55 PM

## 2019-12-15 NOTE — Anesthesia Procedure Notes (Signed)
Procedure Name: Intubation Date/Time: 12/15/2019 2:59 PM Performed by: Imagene Riches, CRNA Pre-anesthesia Checklist: Patient identified, Emergency Drugs available, Suction available and Patient being monitored Patient Re-evaluated:Patient Re-evaluated prior to induction Oxygen Delivery Method: Circle System Utilized Preoxygenation: Pre-oxygenation with 100% oxygen Induction Type: IV induction Ventilation: Mask ventilation without difficulty Laryngoscope Size: Miller and 3 Grade View: Grade I Tube type: Oral Tube size: 8.5 mm Number of attempts: 1 Airway Equipment and Method: Stylet and Oral airway Placement Confirmation: ETT inserted through vocal cords under direct vision,  positive ETCO2 and breath sounds checked- equal and bilateral Secured at: 22 cm Tube secured with: Tape Dental Injury: Teeth and Oropharynx as per pre-operative assessment

## 2019-12-15 NOTE — Discharge Instructions (Signed)
Flexible Bronchoscopy, Care After This sheet gives you information about how to care for yourself after your test. Your doctor may also give you more specific instructions. If you have problems or questions, contact your doctor. Follow these instructions at home: Eating and drinking  The day after the test, go back to your normal diet. Driving  Do not drive for 24 hours if you were given a medicine to help you relax (sedative).  Do not drive or use heavy machinery while taking prescription pain medicine. General instructions   Take over-the-counter and prescription medicines only as told by your doctor.  Return to your normal activities as told. Ask what activities are safe for you.  Do not use any products that have nicotine or tobacco in them. This includes cigarettes and e-cigarettes. If you need help quitting, ask your doctor.  Keep all follow-up visits as told by your doctor. This is important. It is very important if you had a tissue sample (biopsy) taken. Get help right away if:  You have shortness of breath that gets worse.  You get light-headed.  You feel like you are going to pass out (faint).  You have chest pain.  You cough up: ? More than a little blood. ? More blood than before. Summary  Do not eat or drink anything (not even water) for 2 hours after your test, or until your numbing medicine wears off.  Do not use cigarettes. Do not use e-cigarettes.  Get help right away if you have chest pain. This information is not intended to replace advice given to you by your health care provider. Make sure you discuss any questions you have with your health care provider. Document Revised: 05/01/2017 Document Reviewed: 06/06/2016 Elsevier Patient Education  2020 [employer].

## 2019-12-15 NOTE — Progress Notes (Signed)
Dr. Doroteo Glassman from anesthesia made aware of the pt's elevated blood pressures.

## 2019-12-15 NOTE — H&P (View-Only) (Signed)
NAME:  Anne Harris, MRN:  [id number], DOB:  16-Feb-1961, LOS: 0 ADMISSION DATE:  12/15/2019, CONSULTATION DATE: 12/15/2019 REFERRING MD: Dr. Alvy Bimler, CHIEF COMPLAINT: Mediastinal adenopathy abnormal pet imaging   History of present illness   59 year old female with a recent diagnosis of squamous cell carcinoma of the cervix.  Past medical history of hypertension and obesity.  Patient was referred for evaluation of abnormal PET scan imaging of the chest to include PET avid mediastinal adenopathy.  Patient presents today for outpatient video bronchoscopy with endobronchial ultrasound and transbronchial needle aspirations for tissue sampling of the mediastinal adenopathy seen on imaging.  We discussed this today at patient's bedside.  We discussed risk benefits and alternatives of proceeding with video bronchoscopy with transbronchial needle aspirations to include bleeding, pneumothorax and death.  No barriers to proceed.  Patient is not on any anticoagulation.  In preop patient's systolic blood pressures were approximately 200.  This was discussed with anesthesia.  Decision was made to go ahead and proceed with procedure.  She will need outpatient follow-up with primary care for management of hypertension.  Past Medical History   Past Medical History:  Diagnosis Date  . Abnormal Pap smear of cervix   . Cervical cancer ([place])   . Hypertension   . Mediastinal adenopathy   . Obesity, Class III, BMI 40-49.9 (morbid obesity) ([place])   . Wears glasses      Objective   Blood pressure (!) 214/87, pulse 92, temperature 98.8 F (37.1 C), temperature source Oral, resp. rate 18, height 5\' 4"  (1.626 m), weight 110.2 kg, SpO2 100 %.       No intake or output data in the 24 hours ending 12/15/19 1225 Filed Weights   12/15/19 1118  Weight: 110.2 kg    Examination: General: Elderly female resting comfortably bed no distress HENT: Appropriately, NCAT Lungs: Clear to auscultation  bilaterally Cardiovascular: Regular rate rhythm S1-S2 Abdomen: Obese, soft nontender nondistended Extremities: No significant edema, no rash Neuro: All 4 extremities no focal deficit GU: Deferred  Assessment & Plan:   Recent diagnosis of squamous cell carcinoma of the cervix PET avid mediastinal adenopathy Plan: Discussed risk benefits and alternatives of proceeding with video bronchoscopy transbronchial needle aspirations of mediastinal adenopathy. Plan for video bronchoscopy with endobronchial ultrasound. Discussed risk of bleeding, pneumothorax and death. Patient is agreeable to proceed.  No barriers.  I have personally reviewed patient's PET scan imaging as well as CT imaging of the chest.  This reveals PET avid adenopathy as described above. The patient's images have been independently reviewed by me.     Labs   CBC: Recent Labs  Lab 12/15/19 1207  WBC 8.4  HGB 9.9*  HCT 33.6*  MCV 78.9*  PLT 810    Basic Metabolic Panel: No results for input(s): NA, K, CL, CO2, GLUCOSE, BUN, CREATININE, CALCIUM, MG, PHOS in the last 168 hours. GFR: CrCl cannot be calculated (Patient's most recent lab result is older than the maximum 21 days allowed.). Recent Labs  Lab 12/15/19 1207  WBC 8.4    Liver Function Tests: No results for input(s): AST, ALT, ALKPHOS, BILITOT, PROT, ALBUMIN in the last 168 hours. No results for input(s): LIPASE, AMYLASE in the last 168 hours. No results for input(s): AMMONIA in the last 168 hours.  ABG    Component Value Date/Time   TCO2 27 09/25/2010 1500     Coagulation Profile: No results for input(s): INR, PROTIME in the last 168 hours.  Cardiac Enzymes: No results  for input(s): CKTOTAL, CKMB, CKMBINDEX, TROPONINI in the last 168 hours.  HbA1C: No results found for: HGBA1C  CBG: No results for input(s): GLUCAP in the last 168 hours.  Review of Systems:   Review of Systems  Constitutional: Negative for chills, fever, malaise/fatigue  and weight loss.  HENT: Negative for hearing loss, sore throat and tinnitus.   Eyes: Negative for blurred vision and double vision.  Respiratory: Negative for cough, hemoptysis, sputum production, shortness of breath, wheezing and stridor.   Cardiovascular: Negative for chest pain, palpitations, orthopnea, leg swelling and PND.  Gastrointestinal: Negative for abdominal pain, constipation, diarrhea, heartburn, nausea and vomiting.  Genitourinary: Negative for dysuria, hematuria and urgency.  Musculoskeletal: Negative for joint pain and myalgias.  Skin: Negative for itching and rash.  Neurological: Negative for dizziness, tingling, weakness and headaches.  Endo/Heme/Allergies: Negative for environmental allergies. Does not bruise/bleed easily.  Psychiatric/Behavioral: Negative for depression. The patient is nervous/anxious. The patient does not have insomnia.   All other systems reviewed and are negative.    Past Medical History  She,  has a past medical history of Abnormal Pap smear of cervix, Cervical cancer ([place]), Hypertension, Mediastinal adenopathy, Obesity, Class III, BMI 40-49.9 (morbid obesity) ([place]), and Wears glasses.   Surgical History   History reviewed. No pertinent surgical history.   Social History   reports that she has never smoked. She has never used smokeless tobacco. She reports current alcohol use. She reports that she does not use drugs.   Family History   Her family history includes Colon cancer (age of onset: 5) in her mother. There is no history of Ovarian cancer, Uterine cancer, Breast cancer, or Cervical cancer.   Allergies No Known Allergies   Home Medications  Prior to Admission medications   Medication Sig Start Date End Date Taking? Authorizing Provider  ibuprofen (ADVIL) 200 MG tablet Take 600 mg by mouth every 6 (six) hours as needed for headache or moderate pain.   Yes [provider]  docusate sodium (COLACE) 100 MG capsule Take 1 capsule  (100 mg total) by mouth 2 (two) times daily as needed for mild constipation or moderate constipation. Patient not taking: Reported on 12/02/2019 11/03/19   Caccavale, Sophia, PA-C  ferrous sulfate 325 (65 FE) MG tablet Take 1 tablet (325 mg total) by mouth daily. Patient not taking: Reported on 12/02/2019 11/03/19   Caccavale, Sophia, PA-C  lidocaine-prilocaine (EMLA) cream Apply to affected area once 12/14/19   Heath Lark, MD  ondansetron (ZOFRAN) 8 MG tablet Take 1 tablet (8 mg total) by mouth every 8 (eight) hours as needed. Start on the third day after chemotherapy. 12/14/19   Heath Lark, MD  prochlorperazine (COMPAZINE) 10 MG tablet Take 1 tablet (10 mg total) by mouth every 6 (six) hours as needed (Nausea or vomiting). 12/14/19   Heath Lark, MD      [facility] 12/15/2019 12:25 PM

## 2019-12-15 NOTE — Anesthesia Postprocedure Evaluation (Signed)
Anesthesia Post Note  Patient: SHARMA LAWRANCE  Procedure(s) Performed: ENDOBRONCHIAL ULTRASOUND (N/A ) BRONCHIAL NEEDLE ASPIRATION BIOPSIES BRONCHIAL BIOPSIES BRONCHIAL WASHINGS     Patient location during evaluation: Endoscopy Anesthesia Type: General Level of consciousness: oriented and awake and alert Pain management: pain level controlled Postop Assessment: no headache Comments: Patient hypertensive post-op. Plan to admit for treatment and observation.   No complications documented.  Last Vitals:  Vitals:   12/15/19 1730 12/15/19 1740  BP: (!) 196/95 (!) 193/102  Pulse: 94 90  Resp: 15 16  Temp:    SpO2: 96% 95%    Last Pain:  Vitals:   12/15/19 1740  TempSrc:   PainSc: 0-No pain                 Marysa Wessner COKER

## 2019-12-16 ENCOUNTER — Encounter (HOSPITAL_COMMUNITY): Payer: Self-pay | Admitting: Internal Medicine

## 2019-12-16 DIAGNOSIS — I160 Hypertensive urgency: Secondary | ICD-10-CM | POA: Diagnosis not present

## 2019-12-16 DIAGNOSIS — R590 Localized enlarged lymph nodes: Secondary | ICD-10-CM | POA: Diagnosis not present

## 2019-12-16 LAB — BASIC METABOLIC PANEL
Anion gap: 12 (ref 5–15)
BUN: 9 mg/dL (ref 6–20)
CO2: 24 mmol/L (ref 22–32)
Calcium: 9.1 mg/dL (ref 8.9–10.3)
Chloride: 104 mmol/L (ref 98–111)
Creatinine, Ser: 0.98 mg/dL (ref 0.44–1.00)
GFR calc Af Amer: >60 mL/min (ref >60)
GFR calc non Af Amer: >60 mL/min (ref >60)
Glucose, Bld: 118 mg/dL — ABNORMAL HIGH (ref 70–99)
Potassium: 3.9 mmol/L (ref 3.5–5.1)
Sodium: 140 mmol/L (ref 135–145)

## 2019-12-16 LAB — CBC
HCT: 33.3 % — ABNORMAL LOW (ref 36.0–46.0)
Hemoglobin: 10 g/dL — ABNORMAL LOW (ref 12.0–15.0)
MCH: 23.9 pg — ABNORMAL LOW (ref 26.0–34.0)
MCHC: 30 g/dL (ref 30.0–36.0)
MCV: 79.5 fL — ABNORMAL LOW (ref 80.0–100.0)
Platelets: 364 10*3/uL (ref 150–400)
RBC: 4.19 MIL/uL (ref 3.87–5.11)
RDW: 14.7 % (ref 11.5–15.5)
WBC: 5.4 10*3/uL (ref 4.0–10.5)
nRBC: 0 % (ref 0.0–0.2)

## 2019-12-16 LAB — CD4/CD8 (T-HELPER/T-SUPPRESSOR CELL)
CD4 absolute: 203 /uL — ABNORMAL LOW (ref 400–1790)
CD4%: 42 % (ref 33–65)
CD8 T Cell Abs: 126 /uL — ABNORMAL LOW (ref 190–1000)
CD8tox: 26 % (ref 12–40)
Ratio: 1.61 (ref 1.0–3.0)
Total lymphocyte count: 486 /uL — ABNORMAL LOW (ref 1000–4000)

## 2019-12-16 LAB — ACID FAST SMEAR (AFB, MYCOBACTERIA)
Acid Fast Smear: NEGATIVE
Acid Fast Smear: NEGATIVE

## 2019-12-16 MED ORDER — AMLODIPINE BESYLATE 5 MG PO TABS
5.0000 mg | ORAL_TABLET | Freq: Every day | ORAL | Status: DC
  Administered 2019-12-16: 5 mg via ORAL
  Filled 2019-12-16: qty 1

## 2019-12-16 MED ORDER — AMLODIPINE BESYLATE 5 MG PO TABS: 5.0000 mg | ORAL_TABLET | Freq: Every day | ORAL | 0 refills | Status: DC

## 2019-12-16 MED ORDER — HYDROCHLOROTHIAZIDE 25 MG PO TABS: 25.0000 mg | ORAL_TABLET | Freq: Every day | ORAL | 0 refills | Status: DC

## 2019-12-16 MED FILL — AMLODIPINE BESYLATE 5 MG TA: 5 | 30 days supply | Qty: 30 | Fill #0

## 2019-12-16 MED FILL — HYDROCHLOROTHIAZIDE 25 MG T: 25 | 30 days supply | Qty: 30 | Fill #0

## 2019-12-16 NOTE — Progress Notes (Signed)
Heath Lark, MD 7/14/20212:35 PM  CHIEF COMPLAINTS/PURPOSE OF CONSULTATION:  Cervical cancer  HISTORY OF PRESENTING ILLNESS:  Anne Harris 59 y.o. female is here because of recent diagnosis of cervical cancer I have reviewed her chart and materials related to her cancer extensively and collaborated history with the patient. Summary of oncologic history is as follows:    Oncology History  Squamous cell carcinoma of cervix ([place])  10/21/2019 Initial Diagnosis   The patient presented to the emergency department on 5/25 and again on 6/3 for abdominal pain and postmenopausal bleeding that started 5/21. On 6/10 she underwent Pap smear. Last Pap smear had been 30 years prior and normal per patient.    11/21/2019 Pathology Results   A. CERVIX, BIOPSY:  - Squamous cell carcinoma    11/29/2019 PET scan   1. Highly hypermetabolic cervical mass, with fluid distended endometrial cavity. Maximum SUV of the cervical mass is 25.8. 2. Left adnexal lesion versus more likely fundal fibroid, maximum SUV 6.0. Surveillance suggested. 3. There is low-grade activity in several abdominal lymph nodes including a borderline enlarged peripancreatic lymph node. However, there is striking activity in numerous enlarged thoracic lymph nodes, some of which have associated calcifications. It would be unusual to have this degree of thoracic adenopathy from cervical cancer without having a commensurate level of abdominopelvic adenopathy. This along with the calcifications of the lymph nodes raises the possibility of active granulomatous process (such as sarcoidosis) as a potential cause for the striking thoracic adenopathy. Concomitant lymphoma might also have a similar appearance. Sampling of thoracic lymph nodes may be helpful in differentiating metastatic disease other entities such as sarcoid or Lymphoma.  4. Mild pleural/subpleural nodularity in the lungs, merit surveillance. 5. Cholelithiasis.   11/30/2019  Cancer Staging   Staging form: Cervix Uteri, AJCC Version 9 - Clinical stage from 11/30/2019: FIGO Stage IIB (cT2b, cN0, cM0) - Signed by Heath Lark, MD on 11/30/2019   12/02/2019 Imaging   Ct abdomen and pelvis 1. Cervical soft tissue mass as described in keeping with known malignancy. 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 2. Mildly rounded subcentimeter aortocaval and right pelvic sidewall lymph nodes. 3. A 5.3 x 3.9 cm left fundal exophytic soft tissue suboptimally characterized but possibly a fibroid. 4. Cholelithiasis. 5. No bowel obstruction. Normal appendix. 6. Small bilateral lung base subpleural nodules similar to prior PET CT. Clinical correlation and follow-up recommended.     Past/Anticipated interventions by Gyn/Onc surgery, if any: no  Past/Anticipated interventions by medical oncology, if any: Chemo  Weight changes, if any: no  Bowel/Bladder complaints, if any: no  Nausea/Vomiting, if any: no  Pain issues, if any:  no  SAFETY ISSUES:  Prior radiation? no  Pacemaker/ICD? no  Possible current pregnancy? Postmenopausal  Is the patient on methotrexate? no  Current Complaints / other details: Starts chemo 12/29/2019.  BP (!) 172/106 (BP Location: Left Arm, Patient Position: Sitting)   Pulse (!) 103   Temp 98.9 F (37.2 C) (Oral)   Resp 18   Ht 5\' 4"  (1.626 m)   Wt 232 lb 6 oz (105.4 kg)   SpO2 99%   BMI 39.89 kg/m    Wt Readings from Last 3 Encounters:  12/19/19 232 lb 6 oz (105.4 kg)  12/16/19 243 lb 9.7 oz (110.5 kg)  12/14/19 243 lb (110.2 kg)     wt

## 2019-12-16 NOTE — TOC Progression Note (Signed)
Transition of Care [facility]) - Progression Note    Patient Details  Name: Anne Harris MRN: [id number] Date of Birth: 11/08/1960  [facility] CM/SW [place], RN Phone Number: [phone]  12/16/2019, 12:16 PM  Clinical Narrative:    CM consulted for patient with no insurance on file and needs scripts filled. MATCH form has been completed and meds will be filled by [facility] and delivered to the patients room.         Expected Discharge Plan and Services                                                 Social Determinants of Health (SDOH) Interventions    Readmission Risk Interventions No flowsheet data found.

## 2019-12-16 NOTE — Progress Notes (Signed)
Anne Harris to be D/C'd home per MD order. Discussed with the patient and all questions fully answered.   VVS, Skin clean, dry and intact without evidence of skin break down, no evidence of skin tears noted.  IV catheter discontinued intact. Site without signs and symptoms of complications. Dressing and pressure applied.  An After Visit Summary was printed and given to the patient.  Patient escorted via [name], and D/C home via private auto.  Clois Dupes  12/16/2019 4:31 PM

## 2019-12-16 NOTE — Discharge Summary (Signed)
Physician Discharge Summary  Anne Harris MLY:[id number] DOB: 11/27/1960 DOA: 12/15/2019  PCP: Mora Bellman, MD  Admit date: 12/15/2019 Discharge date: 12/16/2019  Discharge disposition: Home   Recommendations for Outpatient Follow-Up:   Follow-up with PCP in 1 week Follow-up with oncologist and pulmonologist as scheduled   Discharge Diagnosis:   Principal Problem:   Hypertensive urgency Active Problems:   Obesity, Class III, BMI 40-49.9 (morbid obesity) ([place])   Anemia due to chronic illness   Mediastinal adenopathy    Discharge Condition: Stable.  Diet recommendation:  Diet Order            Diet - low sodium heart healthy                       Code Status: Full Code     Hospital Course:   Ms. Anne Harris is a 59 year old woman who was recently diagnosed with squamous cell carcinoma of the cervix as part of evaluation for routine screening for breast and cervical cancer.  She told me she has history of hypertension but does not follow-up with any PCP and does not have any "access to medicines ".  She was referred by gynecological oncology to pulmonologist for outpatient video bronchoscopy with endobronchial ultrasound because of mediastinal adenopathy.  Patient tolerated the procedure well.  However, the bronchoscopy was weeks, she was noted to have severe hypertension/hypertensive urgency with blood pressure of 247/98.  She was subsequently referred to the hospitalist for admission for further management.  She was treated with multiple antihypertensives and her blood pressure has improved.  She is asymptomatic.  Management of hypertension was discussed and the importance of medical adherence was emphasized.  Case manager was consulted to assist with filling her prescriptions.  She will be discharged on amlodipine and hydrochlorothiazide.  Close monitoring of blood pressure and close follow-up with PCP was recommended.  She is deemed stable for discharge  to home today.     Discharge Exam:   Vitals:   12/16/19 1006 12/16/19 1325  BP: 140/81 (!) 149/67  Pulse:    Resp:    Temp:    SpO2:     Vitals:   12/15/19 2320 12/16/19 0400 12/16/19 1006 12/16/19 1325  BP: (!) 153/73 (!) 155/78 140/81 (!) 149/67  Pulse: 90 65    Resp: 18 14    Temp: 98.7 F (37.1 C) 98.3 F (36.8 C)    TempSrc: Oral Oral    SpO2: 95% 98%    Weight:  110.5 kg    Height:  5\' 4"  (1.626 m)       GEN: NAD SKIN: No rash EYES: EOMI ENT: MMM CV: RRR PULM: CTA B ABD: soft, obese, NT, +BS CNS: AAO x 3, non focal EXT: No edema or tenderness   The results of significant diagnostics from this hospitalization (including imaging, microbiology, ancillary and laboratory) are listed below for reference.     Procedures and Diagnostic Studies:   No results found.   Labs:   Basic Metabolic Panel: Recent Labs  Lab 12/15/19 1207 12/16/19 0428  NA 141 140  K 3.5 3.9  CL 106 104  CO2 23 24  GLUCOSE 88 118*  BUN 6 9  CREATININE 0.90 0.98  CALCIUM 9.2 9.1   GFR Estimated Creatinine Clearance: 74.2 mL/min (by C-G formula based on SCr of 0.98 mg/dL). Liver Function Tests: Recent Labs  Lab 12/15/19 1207  AST 19  ALT 13  ALKPHOS 63  BILITOT 0.5  PROT 7.2  ALBUMIN 3.5   No results for input(s): LIPASE, AMYLASE in the last 168 hours. No results for input(s): AMMONIA in the last 168 hours. Coagulation profile Recent Labs  Lab 12/15/19 1207  INR 1.1    CBC: Recent Labs  Lab 12/15/19 1207 12/16/19 0428  WBC 8.4 5.4  HGB 9.9* 10.0*  HCT 33.6* 33.3*  MCV 78.9* 79.5*  PLT 342 364   Cardiac Enzymes: No results for input(s): CKTOTAL, CKMB, CKMBINDEX, TROPONINI in the last 168 hours. BNP: Invalid input(s): POCBNP CBG: No results for input(s): GLUCAP in the last 168 hours. D-Dimer No results for input(s): DDIMER in the last 72 hours. Hgb A1c No results for input(s): HGBA1C in the last 72 hours. Lipid Profile No results for input(s):  CHOL, HDL, LDLCALC, TRIG, CHOLHDL, LDLDIRECT in the last 72 hours. Thyroid function studies No results for input(s): TSH, T4TOTAL, T3FREE, THYROIDAB in the last 72 hours.  Invalid input(s): FREET3 Anemia work up Recent Labs    12/15/19 1827  VITAMINB12 245  FOLATE 15.5  FERRITIN 9*  TIBC 407  IRON 21*  RETICCTPCT 1.2   Microbiology Recent Results (from the past 240 hour(s))  SARS CORONAVIRUS 2 (TAT 6-24 HRS) Nasopharyngeal Nasopharyngeal Swab     Status: None   Collection Time: 12/13/19  8:33 AM   Specimen: Nasopharyngeal Swab  Result Value Ref Range Status   SARS Coronavirus 2 NEGATIVE NEGATIVE Final    Comment: (NOTE) SARS-CoV-2 target nucleic acids are NOT DETECTED.  The SARS-CoV-2 RNA is generally detectable in upper and lower respiratory specimens during the acute phase of infection. Negative results do not preclude SARS-CoV-2 infection, do not rule out co-infections with other pathogens, and should not be used as the sole basis for treatment or other patient management decisions. Negative results must be combined with clinical observations, patient history, and epidemiological information. The expected result is Negative.  Fact Sheet for Patients: SugarRoll.be  Fact Sheet for Healthcare Providers: https://www.woods-mathews.com/  This test is not yet approved or cleared by the [place] FDA and  has been authorized for detection and/or diagnosis of SARS-CoV-2 by FDA under an Emergency Use Authorization (EUA). This EUA will remain  in effect (meaning this test can be used) for the duration of the COVID-19 declaration under Se ction 564(b)(1) of the Act, 21 U.S.C. section 360bbb-3(b)(1), unless the authorization is terminated or revoked sooner.  Performed at [facility], [place] [address] [place], [place] [address]   Aerobic/Anaerobic Culture (surgical/deep wound)     Status: None (Preliminary result)    Collection Time: 12/15/19  3:25 PM   Specimen: PATH Respiratory biopsy; Tissue  Result Value Ref Range Status   Specimen Description TISSUE  Final   Special Requests LYMPH NODE 7  Final   Gram Stain   Final    FEW WBC PRESENT,BOTH PMN AND MONONUCLEAR NO ORGANISMS SEEN    Culture   Final    NO GROWTH < 24 HOURS Performed at [facility], [place] [address] [place], [place] [address]    Report Status PENDING  Incomplete  Culture, respiratory     Status: None (Preliminary result)   Collection Time: 12/15/19  3:40 PM   Specimen: Bronchial Washing, Right; Respiratory  Result Value Ref Range Status   Specimen Description BRONCHIAL ALVEOLAR LAVAGE  Final   Special Requests RIGHT UPPER LOBE SPEC B  Final   Gram Stain   Final    RARE WBC PRESENT, PREDOMINANTLY PMN RARE GRAM  POSITIVE COCCI IN PAIRS    Culture   Final    CULTURE REINCUBATED FOR BETTER GROWTH Performed at [facility], [place] [address], [place], [place] [address]    Report Status PENDING  Incomplete     Discharge Instructions:   Discharge Instructions    Diet - low sodium heart healthy   Complete by: As directed    Increase activity slowly   Complete by: As directed      Allergies as of 12/16/2019   No Known Allergies     Medication List    STOP taking these medications   docusate sodium 100 MG capsule Commonly known as: COLACE   ferrous sulfate 325 (65 FE) MG tablet     TAKE these medications   amLODipine 5 MG tablet Commonly known as: NORVASC Take 1 tablet (5 mg total) by mouth daily. Start taking on: December 17, 2019   hydrochlorothiazide 25 MG tablet Commonly known as: HYDRODIURIL Take 1 tablet (25 mg total) by mouth daily. Start taking on: December 17, 2019   ibuprofen 200 MG tablet Commonly known as: ADVIL Take 600 mg by mouth every 6 (six) hours as needed for headache or moderate pain.   lidocaine-prilocaine cream Commonly known as: EMLA Apply to affected area once   ondansetron 8 MG  tablet Commonly known as: Zofran Take 1 tablet (8 mg total) by mouth every 8 (eight) hours as needed. Start on the third day after chemotherapy.   prochlorperazine 10 MG tablet Commonly known as: COMPAZINE Take 1 tablet (10 mg total) by mouth every 6 (six) hours as needed (Nausea or vomiting).       Follow-up Information    Icard, Bradley L, DO In 2 weeks.   Specialty: Pulmonary Disease Contact information: [place] [address] [phone]                Time coordinating discharge: 29 minutes  Signed:  Jennye Boroughs  [facility] 12/16/2019, 2:14 PM

## 2019-12-17 LAB — CULTURE, RESPIRATORY W GRAM STAIN: Culture: NORMAL

## 2019-12-18 LAB — PATHOLOGIST SMEAR REVIEW

## 2019-12-19 ENCOUNTER — Telehealth: Payer: Self-pay

## 2019-12-19 ENCOUNTER — Ambulatory Visit
Admission: RE | Admit: 2019-12-19 | Discharge: 2019-12-19 | Disposition: A | Discharge status: Home / Self Care | Payer: BC Managed Care – PPO | Source: Ambulatory Visit | Attending: Radiation Oncology | Admitting: Radiation Oncology

## 2019-12-19 ENCOUNTER — Encounter (HOSPITAL_COMMUNITY): Payer: Self-pay | Admitting: Pulmonary Disease

## 2019-12-19 ENCOUNTER — Inpatient Hospital Stay: Payer: BC Managed Care – PPO

## 2019-12-19 ENCOUNTER — Telehealth: Payer: Self-pay | Admitting: Oncology

## 2019-12-19 ENCOUNTER — Other Ambulatory Visit: Payer: Self-pay

## 2019-12-19 DIAGNOSIS — R918 Other nonspecific abnormal finding of lung field: Secondary | ICD-10-CM | POA: Diagnosis not present

## 2019-12-19 DIAGNOSIS — C539 Malignant neoplasm of cervix uteri, unspecified: Secondary | ICD-10-CM

## 2019-12-19 DIAGNOSIS — E669 Obesity, unspecified: Secondary | ICD-10-CM | POA: Insufficient documentation

## 2019-12-19 DIAGNOSIS — K429 Umbilical hernia without obstruction or gangrene: Secondary | ICD-10-CM | POA: Diagnosis not present

## 2019-12-19 DIAGNOSIS — Z79899 Other long term (current) drug therapy: Secondary | ICD-10-CM | POA: Diagnosis not present

## 2019-12-19 DIAGNOSIS — Z800 Family history of malignant neoplasm of digestive organs: Secondary | ICD-10-CM | POA: Insufficient documentation

## 2019-12-19 DIAGNOSIS — R55 Syncope and collapse: Secondary | ICD-10-CM | POA: Insufficient documentation

## 2019-12-19 DIAGNOSIS — M4802 Spinal stenosis, cervical region: Secondary | ICD-10-CM | POA: Diagnosis not present

## 2019-12-19 DIAGNOSIS — K8020 Calculus of gallbladder without cholecystitis without obstruction: Secondary | ICD-10-CM | POA: Diagnosis not present

## 2019-12-19 DIAGNOSIS — Z803 Family history of malignant neoplasm of breast: Secondary | ICD-10-CM | POA: Diagnosis not present

## 2019-12-19 DIAGNOSIS — I10 Essential (primary) hypertension: Secondary | ICD-10-CM | POA: Diagnosis not present

## 2019-12-19 NOTE — Progress Notes (Signed)
Radiation Oncology         ([phone]) [phone] ________________________________  Initial Outpatient Consultation  Name: Anne Harris MRN: [id number]  Date: 12/19/2019  DOB: [birth year]  VP:[id number], Vickii Chafe, MD  Lafonda Mosses, MD   REFERRING PHYSICIAN: Lafonda Mosses, MD  DIAGNOSIS: The encounter diagnosis was Squamous cell carcinoma of cervix ([place]).   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 female who is seen as a courtesy of Dr. Berline Lopes for an opinion concerning radiation therapy as part of management for her recently diagnosed cervical cancer. Today, she is accompanied by no one. The patient presented to the ED on 10/25/2019 with complaints of postmenopausal vaginal bleeding and abdominal pain. At that time, the patient was noted to have a mild amount of bleeding in the vaginal vault with some bleeding from the cervix. The tissue on the cervix was also noted to be abnormal in appearance, the cervical os was unable to be visualized, and the uterus felt abnormal. The patient was found to be stable in the ED and was discharged home with close OB/GYN follow-up.  The patient was seen in the ED again on 11/03/2019 with complaints of abdominal pain and persistent vaginal bleeding. An ultrasound was offered during that visit, which the patient declined. She was once again discharged home with close OB/GYN follow-up.  PAP smear on 11/10/2019 was positive for high-risk HPV and showed squamous cell carcinoma.  The patient was referred to Dr. Berline Lopes and was seen in consultation on 11/21/2019. Cervical biopsy at that time showed squamous cell carcinoma. Given that there was already evidence of disease spread into the parametrial tissue, the patient was not considered to be a surgical candidate. They discussed further work-up to evaluate for evidence of locally metastatic  disease followed by primary chemoradiation.  PET scan on 11/29/2019 showed a highly hypermetabolic cervical mass with fluid distended endometrial cavity and a maximum SUV of 25.8. There was also a left adnexal lesion versus more likely fundal fibroid with a maximum SUV of 6.0. Additionally, there was low-grade activity in several abdominal lymph nodes including a borderline enlarged peripancreatic lymph node. However, there was striking activity in numerous enlarged thoracic lymph nodes, some of which had associated calcifications. It was noted to be unusual to have that degree of thoracic adenopathy from cervical cancer without having a commensurate level of abdominopelvic adenopathy. That, along with the calcifications of the lymph nodes, raised the possibility of active granulomatous process (such as sarcoidosis) as a potential cause for the striking thoracic adenopathy. Concomitant lymphoma may also have a similar appearance. Finally, there was mild pleural/sub-pleural nodularity in the lungs that merited surveillance. Given the pulmonary findings, Dr. Berline Lopes referred the patient for a bronchoscopy.  CT of abdomen and pelvis on 12/02/2019 showed a cervical soft tissue mass consistent with known malignancy. There was associated cervical stenosis/obstruction with resulting hydrometra. Additionally, there were mildly rounded sub-centimeter aortocaval and right pelvic sidewall lymph nodes and a 5.3 x 3.9 cm left fundal exophytic soft tissue suboptimally characterized but possibly a fibroid. Finally, there were small bilateral lung base subpleural nodules that were similar in appearance.  The patient's case was discussed at the Gynecologic Oncology Multi-disciplinary Conference on 12/12/2019, during which time it was recommended that she proceed with definitive treatment with concurrent chemoradiation. She was also referred to pulmonary to discuss biopsy of lung lesions.  The patient was seen in consultation  with Dr. Alvy Bimler on 12/14/2019. The plan is for weekly Cisplatin x5 weeks along with radiation treatment.  The patient underwent a video bronchoscopy with endobronchial ultrasound on 12/15/2019 that was performed by Dr. Valeta Harms. Pathology is still pending.  Of note, the patient had undergone a bilateral screening mammogram on 11/10/2019 that showed a possible asymmetry in the left breast. Unilateral diagnostic mammogram and left breast ultrasound on 11/23/2019 showed probable benign left breast focal asymmetries scattered throughout the upper outer quadrant. Six-month follow-up mammogram of the left breast was recommended.  PREVIOUS RADIATION THERAPY: No  PAST MEDICAL HISTORY:  Past Medical History:  Diagnosis Date  . Abnormal Pap smear of cervix   . Cervical cancer ([place])   . Hypertension   . Mediastinal adenopathy   . Obesity, Class III, BMI 40-49.9 (morbid obesity) ([place])   . Wears glasses     PAST SURGICAL HISTORY: Past Surgical History:  Procedure Laterality Date  . BRONCHIAL BIOPSY  12/15/2019   Procedure: BRONCHIAL BIOPSIES;  Surgeon: Garner Nash, DO;  Location: [facility];  Service: Pulmonary;;  . BRONCHIAL NEEDLE ASPIRATION BIOPSY  12/15/2019   Procedure: BRONCHIAL NEEDLE ASPIRATION BIOPSIES;  Surgeon: Garner Nash, DO;  Location: [facility];  Service: Pulmonary;;  . BRONCHIAL WASHINGS  12/15/2019   Procedure: BRONCHIAL WASHINGS;  Surgeon: Garner Nash, DO;  Location: [place];  Service: Pulmonary;;  . CERVICAL BIOPSY    . ENDOBRONCHIAL ULTRASOUND N/A 12/15/2019   Procedure: ENDOBRONCHIAL ULTRASOUND;  Surgeon: Garner Nash, DO;  Location: [place] [facility];  Service: Pulmonary;  Laterality: N/A;    FAMILY HISTORY:  Family History  Problem Relation Age of Onset  . Colon cancer Mother 3  . Ovarian cancer Neg Hx   . Uterine cancer Neg Hx   . Breast cancer Neg Hx   . Cervical cancer Neg Hx     SOCIAL HISTORY:  Social History   Tobacco Use  . Smoking  status: Never Smoker  . Smokeless tobacco: Never Used  Vaping Use  . Vaping Use: Never used  Substance Use Topics  . Alcohol use: Yes    Comment: social  . Drug use: Never    ALLERGIES: No Known Allergies  MEDICATIONS:  Current Outpatient Medications  Medication Sig Dispense Refill  . amLODipine (NORVASC) 5 MG tablet Take 1 tablet (5 mg total) by mouth daily. 30 tablet 0  . hydrochlorothiazide (HYDRODIURIL) 25 MG tablet Take 1 tablet (25 mg total) by mouth daily. 30 tablet 0  . lidocaine-prilocaine (EMLA) cream Apply to affected area once 30 g 3  . ondansetron (ZOFRAN) 8 MG tablet Take 1 tablet (8 mg total) by mouth every 8 (eight) hours as needed. Start on the third day after chemotherapy. 30 tablet 1  . prochlorperazine (COMPAZINE) 10 MG tablet Take 1 tablet (10 mg total) by mouth every 6 (six) hours as needed (Nausea or vomiting). 30 tablet 1   No current facility-administered medications for this encounter.    REVIEW OF SYSTEMS:  A 10+ POINT REVIEW OF SYSTEMS WAS OBTAINED including neurology, dermatology, psychiatry, cardiac, respiratory, lymph, extremities, GI, GU, musculoskeletal, constitutional, reproductive, HEENT.  Patient reports vaginal discharge but no significant bleeding at this time.  She denies any significant pelvic pain low back or flank pain   PHYSICAL EXAM:  height is 5\' 4"  (1.626 m) and weight is 232 lb 6 oz (105.4 kg). Her oral temperature is 98.9 F (37.2 C). Her blood pressure is 172/106 (abnormal) and her pulse is 103 (abnormal). Her  respiration is 18 and oxygen saturation is 99%.   General: Alert and oriented, in no acute distress HEENT: Head is normocephalic. Extraocular movements are intact. Oropharynx is clear. Neck: Neck is supple, no palpable cervical or supraclavicular lymphadenopathy. Heart: Regular in rate and rhythm with no murmurs, rubs, or gallops. Chest: Clear to auscultation bilaterally, with no rhonchi, wheezes, or rales. Abdomen: Soft,  nontender, nondistended, with no rigidity or guarding. Extremities: No cyanosis or edema. Lymphatics: see Neck Exam Skin: No concerning lesions. Musculoskeletal: symmetric strength and muscle tone throughout. Neurologic: Cranial nerves II through XII are grossly intact. No obvious focalities. Speech is fluent. Coordination is intact. Psychiatric: Judgment and insight are intact. Affect is appropriate. On pelvic examination the external genitalia were unremarkable. A speculum exam was performed.  Cervix was noted to be essentially replaced by tumor.  The cervical mass was estimated to be approximately 4 to 5 cm in size.  Was difficult to determine parametrial involvement on exam today.  The cervical mass bled easily on exam and extended approximately one third into the upper vaginal vault.  Rectal exam shows normal sphincter tone and confirms vaginal exam.  ECOG = 1  0 - Asymptomatic (Fully active, able to carry on all predisease activities without restriction)  1 - Symptomatic but completely ambulatory (Restricted in physically strenuous activity but ambulatory and able to carry out work of a light or sedentary nature. For example, light housework, office work)  2 - Symptomatic, <50% in bed during the day (Ambulatory and capable of all self care but unable to carry out any work activities. Up and about more than 50% of waking hours)  3 - Symptomatic, >50% in bed, but not bedbound (Capable of only limited self-care, confined to bed or chair 50% or more of waking hours)  4 - Bedbound (Completely disabled. Cannot carry on any self-care. Totally confined to bed or chair)  5 - Death   Eustace Pen MM, Creech RH, Tormey DC, et al. [phone]). "Toxicity and response criteria of the [facility]". [place] Oncol. 5 (6): 649-55  LABORATORY DATA:  Lab Results  Component Value Date   WBC 5.4 12/16/2019   HGB 10.0 (L) 12/16/2019   HCT 33.3 (L) 12/16/2019   MCV 79.5 (L) 12/16/2019    PLT 364 12/16/2019   NEUTROABS 4.6 09/25/2010   Lab Results  Component Value Date   NA 140 12/16/2019   K 3.9 12/16/2019   CL 104 12/16/2019   CO2 24 12/16/2019   GLUCOSE 118 (H) 12/16/2019   CREATININE 0.98 12/16/2019   CALCIUM 9.1 12/16/2019      RADIOGRAPHY: CT Abdomen Pelvis W Contrast  Result Date: 12/03/2019 CLINICAL DATA:  59 year old female with uterine/cervical cancer staging. EXAM: CT ABDOMEN AND PELVIS WITH CONTRAST TECHNIQUE: Multidetector CT imaging of the abdomen and pelvis was performed using the standard protocol following bolus administration of intravenous contrast. CONTRAST:  138mL OMNIPAQUE IOHEXOL 300 MG/ML  SOLN COMPARISON:  PET CT dated 11/29/2019. FINDINGS: Lower chest: There is several subpleural nodules at the left lung base measuring up to 5 mm (5/4). Additional smaller subpleural nodularity noted involving the right lung base. This finding is similar to prior PET CT. Clinical correlation and follow-up recommended. There is no intra-abdominal free air or free fluid. Hepatobiliary: Probable mild fatty liver. No intrahepatic biliary ductal dilatation. Tiny subcentimeter left hepatic hypodense focus is too small to characterize. There is a small gallstone. No pericholecystic fluid or evidence of acute cholecystitis by CT. Pancreas: Unremarkable.  No pancreatic ductal dilatation or surrounding inflammatory changes. Spleen: Normal in size without focal abnormality. Adrenals/Urinary Tract: The adrenal glands are unremarkable. Mild right renal parenchyma atrophy and cortical scarring and irregularity. There is no hydronephrosis on either side. There is symmetric enhancement and excretion of contrast by both kidneys. The visualized ureters appear unremarkable. The urinary bladder is collapsed. There is apparent diffuse thickening of the bladder wall which may be partly related to underdistention. Cystitis is not excluded. Correlation with urinalysis recommended. Stomach/Bowel: There  is no bowel obstruction or active inflammation. The appendix is normal. Vascular/Lymphatic: The abdominal aorta and IVC are unremarkable. No portal venous gas. Mildly rounded subcentimeter aortocaval lymph node measuring approximately 8 mm (42/2). A subcentimeter mildly rounded right pelvic sidewall lymph node measures 5 mm (57/2). A peripancreatic lymph node measures approximately 1 cm in short axis (20/2). Reproductive: Cervical soft tissue mass measuring approximately 5.2 x 4.1 x 3.0 cm. There is diffuse thickening of the lower uterus with mild nodularity most consistent with infiltration. There is fluid distended endometrium consistent with cervical stenosis/obstruction. A 5.3 x 3.9 cm left fundal exophytic soft tissue suboptimally characterized but possibly a fibroid. The ovaries are grossly unremarkable by CT. Other: Small fat containing umbilical hernia. Musculoskeletal: Degenerative changes of the spine with multilevel facet arthropathy. No acute osseous pathology. No suspicious osseous lesions. IMPRESSION: 1. Cervical soft tissue mass as described in keeping with known malignancy. 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 2. Mildly rounded subcentimeter aortocaval and right pelvic sidewall lymph nodes. 3. A 5.3 x 3.9 cm left fundal exophytic soft tissue suboptimally characterized but possibly a fibroid. 4. Cholelithiasis. 5. No bowel obstruction. Normal appendix. 6. Small bilateral lung base subpleural nodules similar to prior PET CT. Clinical correlation and follow-up recommended. Electronically Signed   By: Anner Crete M.D.   On: 12/03/2019 18:45   NM PET Image Initial (PI) Skull Base To Thigh  Result Date: 11/29/2019 CLINICAL DATA:  Initial treatment strategy for cervical cancer. EXAM: NUCLEAR MEDICINE PET SKULL BASE TO THIGH TECHNIQUE: 12.6 mCi F-18 FDG was injected intravenously. Full-ring PET imaging was performed from the skull base to thigh after the  radiotracer. CT data was obtained and used for attenuation correction and anatomic localization. Fasting blood glucose: 98 mg/dl COMPARISON:  Chest radiograph 03/11/2017 FINDINGS: Mediastinal blood pool activity: SUV max 2.9 Liver activity: SUV max 3.8 NECK: Mildly asymmetric palatine tonsillar activity, maximum SUV 7.2 on the right and 5.7 cm on the left, most likely physiologic in this context. Accentuated activity along the teeth with associated periapical lucencies, favoring tooth decay/dental disease. Incidental CT findings: None CHEST: Hypermetabolic prevascular, right paratracheal, left paratracheal, AP window, subcarinal, bilateral hilar, right internal mammary, and lower thoracic para-aortic adenopathy. Index subcarinal node 1.4 cm in short axis on image 67/4, maximum SUV 14.8. Index right internal mammary lymph node 0.9 cm in short axis on image 62/4, maximum SUV 7.8. Index right lower paratracheal lymph node 1.8 cm in short axis on image 61/4, maximum SUV 15.6. Some of these lymph nodes are partially calcified. A 0.6 by 0.5 cm nodule along the right middle lobe side of the minor fissure has a maximum SUV of 1.6, this lesion is technically too small to characterize but the measurable activity does raise some concern for possible involvement. Pleural based nodules bilaterally are too small to characterize, including a 0.5 by 0.3 cm nodule along the left lower lobe on image 44/8 and small subpleural nodules along the lung apices. Surveillance imaging  is likely warranted. Incidental CT findings: none ABDOMEN/PELVIS: Notably hypermetabolic large cervical mass, maximum SUV 25.8. Distended endometrial cavity. A left fundal a lesion which could be ovarian or a fundal fibroid has maximum SUV of 6.0. Peripancreatic node measuring 1.1 cm in short axis on image 100/4 has maximum SUV of 4.2, mildly above liver activity. A portacaval node measuring 0.8 cm in short axis on image 101/4 has maximum SUV of 4.9. A small  lower aortocaval lymph node measuring 0.6 cm in short axis on image 127/4 has maximum SUV of 4.4. While these nodes are mildly elevated in activity, there certainly strikingly less hypermetabolic than the thoracic lymph nodes. Incidental CT findings: Cholelithiasis. SKELETON: Heterogeneously accentuated activity in the skeleton but without definite CT correlate. L3 vertebral body maximum SUV 6.7 I am skeptical of diffuse osseous metastatic disease as a cause for this heterogeneous activity given the lack of correlate of CT findings. Incidental CT findings: Degenerative facet arthropathy in the lower lumbar spine. Mesoacromial os acromiale on the left. IMPRESSION: 1. Highly hypermetabolic cervical mass, with fluid distended endometrial cavity. Maximum SUV of the cervical mass is 25.8. 2. Left adnexal lesion versus more likely fundal fibroid, maximum SUV 6.0. Surveillance suggested. 3. There is low-grade activity in several abdominal lymph nodes including a borderline enlarged peripancreatic lymph node. However, there is striking activity in numerous enlarged thoracic lymph nodes, some of which have associated calcifications. It would be unusual to have this degree of thoracic adenopathy from cervical cancer without having a commensurate level of abdominopelvic adenopathy. This along with the calcifications of the lymph nodes raises the possibility of active granulomatous process (such as sarcoidosis) as a potential cause for the striking thoracic adenopathy. Concomitant lymphoma might also have a similar appearance. Sampling of thoracic lymph nodes may be helpful in differentiating metastatic disease other entities such as sarcoid or lymphoma. 4. Mild pleural/subpleural nodularity in the lungs, merit surveillance. 5. Cholelithiasis. Electronically Signed   By: Van Clines M.D.   On: 11/29/2019 16:59   US BREAST LTD UNI LEFT INC AXILLA  Result Date: 11/23/2019 CLINICAL DATA:  Patient was called back for  focal asymmetries in the upper outer breast EXAM: DIGITAL DIAGNOSTIC LEFT MAMMOGRAM WITH TOMO ULTRASOUND LEFT BREAST COMPARISON:  Recent screening mammography ACR Breast Density Category b: There are scattered areas of fibroglandular density. FINDINGS: The focal asymmetries in the upper outer left breast persists on additional imaging. On physical exam, no suspicious lumps are identified. Targeted ultrasound is performed, showing no sonographic correlate for the left breast focal asymmetries. IMPRESSION: Probably benign left breast focal asymmetries scattered throughout the upper outer quadrant. RECOMMENDATION: Six-month follow-up mammography of the left breast to ensure stability of the scattered focal asymmetries in the upper outer left breast. I have discussed the findings and recommendations with the patient. If applicable, a reminder letter will be sent to the patient regarding the next appointment. BI-RADS CATEGORY  3: Probably benign. Electronically Signed   By: Dorise Bullion III M.D   On: 11/23/2019 13:46   MS DIGITAL DIAG TOMO UNI LEFT  Result Date: 11/23/2019 CLINICAL DATA:  Patient was called back for focal asymmetries in the upper outer breast EXAM: DIGITAL DIAGNOSTIC LEFT MAMMOGRAM WITH TOMO ULTRASOUND LEFT BREAST COMPARISON:  Recent screening mammography ACR Breast Density Category b: There are scattered areas of fibroglandular density. FINDINGS: The focal asymmetries in the upper outer left breast persists on additional imaging. On physical exam, no suspicious lumps are identified. Targeted ultrasound is performed, showing no sonographic  correlate for the left breast focal asymmetries. IMPRESSION: Probably benign left breast focal asymmetries scattered throughout the upper outer quadrant. RECOMMENDATION: Six-month follow-up mammography of the left breast to ensure stability of the scattered focal asymmetries in the upper outer left breast. I have discussed the findings and recommendations with  the patient. If applicable, a reminder letter will be sent to the patient regarding the next appointment. BI-RADS CATEGORY  3: Probably benign. 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  Radiation therapy would consist of approximately 6 weeks of external beam radiation therapy followed by 5 intracavitary high-dose-rate brachytherapy treatments.  Today, I talked to the patient about the findings and work-up thus far.  We discussed the natural history of cervical cancer and general treatment, highlighting the role of radiotherapy (external beam and brachytherapy) in the management.  We discussed the available radiation techniques, and focused on the details of logistics and delivery.  We reviewed the anticipated acute and late sequelae associated with radiation in this setting.  The patient was encouraged to ask questions that I answered to the best of my ability.  A patient consent form was discussed and signed.  We retained a copy for our records.  The patient would like to proceed with radiation and will be scheduled for CT simulation.  PLAN: Patient will be scheduled for CT simulation this week.  Results from her lung procedure should be back later today or tomorrow.  Anticipate treatment starting the last week in July concomitant with radiosensitizing chemotherapy.  Total time spent in this encounter was 65 minutes which included reviewing the patient's most recent ED visits, labs (biopsies, PAP smear) consultations, imaging (PET scan, CT scan), bronchoscopy, physical examination, and documentation.    ------------------------------------------------  Blair Promise, PhD,  MD  This document serves as a record of services personally performed by Gery Pray, MD. It was created on his behalf by Clerance Lav, a trained medical scribe. The creation of this record is based on the scribe's personal observations and the provider's statements to them. This document has been checked and approved by the attending provider.

## 2019-12-19 NOTE — Telephone Encounter (Signed)
Called Anne Harris with port placement appointment on 12/22/19 (arrival at 12 pm, NPO after 7am, needs a driver).  She verbalized understanding and agreement.

## 2019-12-20 LAB — AEROBIC/ANAEROBIC CULTURE W GRAM STAIN (SURGICAL/DEEP WOUND): Culture: NO GROWTH

## 2019-12-20 LAB — SURGICAL PATHOLOGY

## 2019-12-20 LAB — CYTOLOGY - NON PAP

## 2019-12-20 NOTE — Telephone Encounter (Signed)
Error

## 2019-12-21 ENCOUNTER — Ambulatory Visit
Admission: RE | Admit: 2019-12-21 | Discharge: 2019-12-21 | Disposition: A | Discharge status: Home / Self Care | Payer: BC Managed Care – PPO | Source: Ambulatory Visit | Attending: Radiation Oncology | Admitting: Radiation Oncology

## 2019-12-21 ENCOUNTER — Telehealth: Payer: Self-pay | Admitting: Oncology

## 2019-12-21 ENCOUNTER — Other Ambulatory Visit: Payer: Self-pay | Admitting: Student

## 2019-12-21 ENCOUNTER — Other Ambulatory Visit: Payer: Self-pay

## 2019-12-21 DIAGNOSIS — Z510 Encounter for antineoplastic radiation therapy: Secondary | ICD-10-CM | POA: Insufficient documentation

## 2019-12-21 DIAGNOSIS — C539 Malignant neoplasm of cervix uteri, unspecified: Secondary | ICD-10-CM | POA: Insufficient documentation

## 2019-12-21 NOTE — Telephone Encounter (Signed)
Anne Harris called regarding her lung biopsy results.  Advised her of the results and that we will discuss her case at the [facility] and that I will call her and let her know the recommendations.  She verbalized understanding and agreement.

## 2019-12-22 ENCOUNTER — Encounter (HOSPITAL_COMMUNITY): Payer: Self-pay

## 2019-12-22 ENCOUNTER — Ambulatory Visit (HOSPITAL_COMMUNITY)
Admit: 2019-12-22 | Discharge: 2019-12-22 | Disposition: A | Discharge status: Home / Self Care | Payer: BC Managed Care – PPO | Attending: Hematology and Oncology | Admitting: Hematology and Oncology

## 2019-12-22 ENCOUNTER — Other Ambulatory Visit: Payer: Self-pay | Admitting: Student

## 2019-12-22 ENCOUNTER — Ambulatory Visit (HOSPITAL_COMMUNITY)
Admission: RE | Admit: 2019-12-22 | Discharge: 2019-12-22 | Disposition: A | Discharge status: Home / Self Care | Payer: BC Managed Care – PPO | Source: Ambulatory Visit | Attending: Hematology and Oncology | Admitting: Hematology and Oncology

## 2019-12-22 DIAGNOSIS — C539 Malignant neoplasm of cervix uteri, unspecified: Secondary | ICD-10-CM

## 2019-12-22 DIAGNOSIS — Z79899 Other long term (current) drug therapy: Secondary | ICD-10-CM | POA: Diagnosis not present

## 2019-12-22 DIAGNOSIS — I10 Essential (primary) hypertension: Secondary | ICD-10-CM | POA: Insufficient documentation

## 2019-12-22 DIAGNOSIS — Z6841 Body Mass Index (BMI) 40.0 and over, adult: Secondary | ICD-10-CM | POA: Insufficient documentation

## 2019-12-22 HISTORY — PX: IR IMAGING GUIDED PORT INSERTION: IMG5740

## 2019-12-22 LAB — CBC WITH DIFFERENTIAL/PLATELET
Abs Immature Granulocytes: 0.02 10*3/uL (ref 0.00–0.07)
Basophils Absolute: 0.1 10*3/uL (ref 0.0–0.1)
Basophils Relative: 1 %
Eosinophils Absolute: 0.3 10*3/uL (ref 0.0–0.5)
Eosinophils Relative: 3 %
HCT: 39.2 % (ref 36.0–46.0)
Hemoglobin: 11.6 g/dL — ABNORMAL LOW (ref 12.0–15.0)
Immature Granulocytes: 0 %
Lymphocytes Relative: 15 %
Lymphs Abs: 1.5 10*3/uL (ref 0.7–4.0)
MCH: 23.2 pg — ABNORMAL LOW (ref 26.0–34.0)
MCHC: 29.6 g/dL — ABNORMAL LOW (ref 30.0–36.0)
MCV: 78.4 fL — ABNORMAL LOW (ref 80.0–100.0)
Monocytes Absolute: 0.6 10*3/uL (ref 0.1–1.0)
Monocytes Relative: 6 %
Neutro Abs: 7.5 10*3/uL (ref 1.7–7.7)
Neutrophils Relative %: 75 %
Platelets: 395 10*3/uL (ref 150–400)
RBC: 5 MIL/uL (ref 3.87–5.11)
RDW: 14.9 % (ref 11.5–15.5)
WBC: 10 10*3/uL (ref 4.0–10.5)
nRBC: 0 % (ref 0.0–0.2)

## 2019-12-22 LAB — COMPREHENSIVE METABOLIC PANEL
ALT: 13 U/L (ref 0–44)
AST: 22 U/L (ref 15–41)
Albumin: 4.3 g/dL (ref 3.5–5.0)
Alkaline Phosphatase: 76 U/L (ref 38–126)
Anion gap: 14 (ref 5–15)
BUN: 12 mg/dL (ref 6–20)
CO2: 28 mmol/L (ref 22–32)
Calcium: 9.5 mg/dL (ref 8.9–10.3)
Chloride: 95 mmol/L — ABNORMAL LOW (ref 98–111)
Creatinine, Ser: 0.85 mg/dL (ref 0.44–1.00)
GFR calc Af Amer: >60 mL/min (ref >60)
GFR calc non Af Amer: >60 mL/min (ref >60)
Glucose, Bld: 105 mg/dL — ABNORMAL HIGH (ref 70–99)
Potassium: 2.8 mmol/L — ABNORMAL LOW (ref 3.5–5.1)
Sodium: 137 mmol/L (ref 135–145)
Total Bilirubin: 0.7 mg/dL (ref 0.3–1.2)
Total Protein: 8.6 g/dL — ABNORMAL HIGH (ref 6.5–8.1)

## 2019-12-22 MED ORDER — FENTANYL CITRATE (PF) 100 MCG/2ML IJ SOLN
INTRAMUSCULAR | Status: AC | PRN
  Administered 2019-12-22 (×2): 50 ug via INTRAVENOUS

## 2019-12-22 MED ORDER — LIDOCAINE-EPINEPHRINE 1 %-1:100000 IJ SOLN
INTRAMUSCULAR | Status: AC
  Filled 2019-12-22: qty 1

## 2019-12-22 MED ORDER — FENTANYL CITRATE (PF) 100 MCG/2ML IJ SOLN
INTRAMUSCULAR | Status: AC
  Filled 2019-12-22: qty 2

## 2019-12-22 MED ORDER — CEFAZOLIN SODIUM-DEXTROSE 2-4 GM/100ML-% IV SOLN: 2.0000 g | INTRAVENOUS | Status: AC

## 2019-12-22 MED ORDER — HEPARIN SOD (PORK) LOCK FLUSH 100 UNIT/ML IV SOLN
INTRAVENOUS | Status: AC
  Filled 2019-12-22: qty 5

## 2019-12-22 MED ORDER — MIDAZOLAM HCL 2 MG/2ML IJ SOLN
INTRAMUSCULAR | Status: AC | PRN
  Administered 2019-12-22 (×2): 1 mg via INTRAVENOUS

## 2019-12-22 MED ORDER — MIDAZOLAM HCL 2 MG/2ML IJ SOLN
INTRAMUSCULAR | Status: AC
  Filled 2019-12-22: qty 2

## 2019-12-22 MED ORDER — SODIUM CHLORIDE 0.9 % IV SOLN: INTRAVENOUS | Status: DC

## 2019-12-22 MED ORDER — CEFAZOLIN SODIUM-DEXTROSE 2-4 GM/100ML-% IV SOLN
INTRAVENOUS | Status: AC
  Administered 2019-12-22: 2 g via INTRAVENOUS
  Filled 2019-12-22: qty 100

## 2019-12-22 NOTE — Procedures (Signed)
Pre Procedure Dx: Poor venous access Post Procedural Dx: Same  Successful placement of right IJ approach port-a-cath with tip at the superior caval atrial junction. The catheter is ready for immediate use.  Estimated Blood Loss: Minimal  Complications: None immediate.  Jay Ashtin Rosner, MD Pager #: [phone]   

## 2019-12-22 NOTE — H&P (Signed)
Chief Complaint: Patient was seen in consultation today for cervical cancer/Port-a-cath placement.  Referring Physician(s): Heath Lark  Supervising Physician: Sandi Mariscal  Patient Status: [facility] - Out-pt  History of Present Illness: Anne Harris is a 59 y.o. female with a past medical history of hypertension, cervical cancer, and obesity. She was unfortunately diagnosed with cervical cancer in 10/2019. Her cancer is managed by Dr. Alvy Bimler. She has tentative plans to begin systemic chemotherapy as management.  IR consulted by Dr. Alvy Bimler for possible image-guided Port-a-cath placement. Patient awake and alert sitting in bed. Complains of mild headache- states secondary to NPO. Denies fever, chills, chest pain, dyspnea, or abdominal pain.   Past Medical History:  Diagnosis Date  . Abnormal Pap smear of cervix   . Cervical cancer ([place])   . Hypertension   . Mediastinal adenopathy   . Obesity, Class III, BMI 40-49.9 (morbid obesity) ([place])   . Wears glasses     Past Surgical History:  Procedure Laterality Date  . BRONCHIAL BIOPSY  12/15/2019   Procedure: BRONCHIAL BIOPSIES;  Surgeon: Garner Nash, DO;  Location: [facility];  Service: Pulmonary;;  . BRONCHIAL NEEDLE ASPIRATION BIOPSY  12/15/2019   Procedure: BRONCHIAL NEEDLE ASPIRATION BIOPSIES;  Surgeon: Garner Nash, DO;  Location: [facility];  Service: Pulmonary;;  . BRONCHIAL WASHINGS  12/15/2019   Procedure: BRONCHIAL WASHINGS;  Surgeon: Garner Nash, DO;  Location: [place];  Service: Pulmonary;;  . CERVICAL BIOPSY    . ENDOBRONCHIAL ULTRASOUND N/A 12/15/2019   Procedure: ENDOBRONCHIAL ULTRASOUND;  Surgeon: Garner Nash, DO;  Location: [place];  Service: Pulmonary;  Laterality: N/A;    Allergies: Patient has no known allergies.  Medications: Prior to Admission medications   Medication Sig Start Date End Date Taking? Authorizing Provider  acetaminophen (TYLENOL) 500 MG tablet Take 1,000 mg  by mouth every 6 (six) hours as needed for moderate pain.   Yes [provider]  amLODipine (NORVASC) 5 MG tablet Take 1 tablet (5 mg total) by mouth daily. 12/17/19   Jennye Boroughs, MD  hydrochlorothiazide (HYDRODIURIL) 25 MG tablet Take 1 tablet (25 mg total) by mouth daily. 12/17/19   Jennye Boroughs, MD  lidocaine-prilocaine (EMLA) cream Apply to affected area once 12/14/19   Heath Lark, MD  ondansetron (ZOFRAN) 8 MG tablet Take 1 tablet (8 mg total) by mouth every 8 (eight) hours as needed. Start on the third day after chemotherapy. 12/14/19   Heath Lark, MD  prochlorperazine (COMPAZINE) 10 MG tablet Take 1 tablet (10 mg total) by mouth every 6 (six) hours as needed (Nausea or vomiting). 12/14/19   Heath Lark, MD     Family History  Problem Relation Age of Onset  . Colon cancer Mother 109  . Ovarian cancer Neg Hx   . Uterine cancer Neg Hx   . Breast cancer Neg Hx   . Cervical cancer Neg Hx     Social History   Socioeconomic History  . Marital status: Single    Spouse name: Not on file  . Number of children: 6  . Years of education: Not on file  . Highest education level: High school graduate  Occupational History  . Occupation: care giver  Tobacco Use  . Smoking status: Never Smoker  . Smokeless tobacco: Never Used  Vaping Use  . Vaping Use: Never used  Substance and Sexual Activity  . Alcohol use: Yes    Comment: social  . Drug use: Never  . Sexual activity: Not Currently  Other Topics Concern  . Not on file  Social History Narrative  . Not on file   Social Determinants of Health   Financial Resource Strain:   . Difficulty of Paying Living Expenses:   Food Insecurity:   . Worried About Charity fundraiser in the Last Year:   . Arboriculturist in the Last Year:   Transportation Needs: No Transportation Needs  . Lack of Transportation (Medical): No  . Lack of Transportation (Non-Medical): No  Physical Activity:   . Days of Exercise per Week:   . Minutes  of Exercise per Session:   Stress:   . Feeling of Stress :   Social Connections:   . Frequency of Communication with Friends and Family:   . Frequency of Social Gatherings with Friends and Family:   . Attends Religious Services:   . Active Member of Clubs or Organizations:   . Attends Archivist Meetings:   Marland Kitchen Marital Status:      Review of Systems: A 12 point ROS discussed and pertinent positives are indicated in the HPI above.  All other systems are negative.  Review of Systems  Constitutional: Negative for chills and fever.  Respiratory: Negative for shortness of breath and wheezing.   Cardiovascular: Negative for chest pain and palpitations.  Gastrointestinal: Negative for abdominal pain.  Neurological: Positive for headaches.  Psychiatric/Behavioral: Negative for behavioral problems and confusion.    Vital Signs: There were no vitals taken for this visit.  Physical Exam Vitals and nursing note reviewed.  Constitutional:      General: She is not in acute distress.    Appearance: Normal appearance.  Cardiovascular:     Rate and Rhythm: Normal rate and regular rhythm.     Heart sounds: Normal heart sounds. No murmur heard.   Pulmonary:     Effort: Pulmonary effort is normal. No respiratory distress.     Breath sounds: Normal breath sounds. No wheezing.  Skin:    General: Skin is warm and dry.  Neurological:     Mental Status: She is alert and oriented to person, place, and time.      MD Evaluation Airway: WNL Heart: WNL Abdomen: WNL Chest/ Lungs: WNL ASA  Classification: 3 Mallampati/Airway Score: Two   Imaging: CT Abdomen Pelvis W Contrast  Result Date: 12/03/2019 CLINICAL DATA:  59 year old female with uterine/cervical cancer staging. EXAM: CT ABDOMEN AND PELVIS WITH CONTRAST TECHNIQUE: Multidetector CT imaging of the abdomen and pelvis was performed using the standard protocol following bolus administration of intravenous contrast. CONTRAST:   174mL OMNIPAQUE IOHEXOL 300 MG/ML  SOLN COMPARISON:  PET CT dated 11/29/2019. FINDINGS: Lower chest: There is several subpleural nodules at the left lung base measuring up to 5 mm (5/4). Additional smaller subpleural nodularity noted involving the right lung base. This finding is similar to prior PET CT. Clinical correlation and follow-up recommended. There is no intra-abdominal free air or free fluid. Hepatobiliary: Probable mild fatty liver. No intrahepatic biliary ductal dilatation. Tiny subcentimeter left hepatic hypodense focus is too small to characterize. There is a small gallstone. No pericholecystic fluid or evidence of acute cholecystitis by CT. Pancreas: Unremarkable. No pancreatic ductal dilatation or surrounding inflammatory changes. Spleen: Normal in size without focal abnormality. Adrenals/Urinary Tract: The adrenal glands are unremarkable. Mild right renal parenchyma atrophy and cortical scarring and irregularity. There is no hydronephrosis on either side. There is symmetric enhancement and excretion of contrast by both kidneys. The visualized ureters appear unremarkable. The urinary  bladder is collapsed. There is apparent diffuse thickening of the bladder wall which may be partly related to underdistention. Cystitis is not excluded. Correlation with urinalysis recommended. Stomach/Bowel: There is no bowel obstruction or active inflammation. The appendix is normal. Vascular/Lymphatic: The abdominal aorta and IVC are unremarkable. No portal venous gas. Mildly rounded subcentimeter aortocaval lymph node measuring approximately 8 mm (42/2). A subcentimeter mildly rounded right pelvic sidewall lymph node measures 5 mm (57/2). A peripancreatic lymph node measures approximately 1 cm in short axis (20/2). Reproductive: Cervical soft tissue mass measuring approximately 5.2 x 4.1 x 3.0 cm. There is diffuse thickening of the lower uterus with mild nodularity most consistent with infiltration. There is fluid  distended endometrium consistent with cervical stenosis/obstruction. A 5.3 x 3.9 cm left fundal exophytic soft tissue suboptimally characterized but possibly a fibroid. The ovaries are grossly unremarkable by CT. Other: Small fat containing umbilical hernia. Musculoskeletal: Degenerative changes of the spine with multilevel facet arthropathy. No acute osseous pathology. No suspicious osseous lesions. IMPRESSION: 1. Cervical soft tissue mass as described in keeping with known malignancy. 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 2. Mildly rounded subcentimeter aortocaval and right pelvic sidewall lymph nodes. 3. A 5.3 x 3.9 cm left fundal exophytic soft tissue suboptimally characterized but possibly a fibroid. 4. Cholelithiasis. 5. No bowel obstruction. Normal appendix. 6. Small bilateral lung base subpleural nodules similar to prior PET CT. Clinical correlation and follow-up recommended. Electronically Signed   By: Anner Crete M.D.   On: 12/03/2019 18:45   NM PET Image Initial (PI) Skull Base To Thigh  Result Date: 11/29/2019 CLINICAL DATA:  Initial treatment strategy for cervical cancer. EXAM: NUCLEAR MEDICINE PET SKULL BASE TO THIGH TECHNIQUE: 12.6 mCi F-18 FDG was injected intravenously. Full-ring PET imaging was performed from the skull base to thigh after the radiotracer. CT data was obtained and used for attenuation correction and anatomic localization. Fasting blood glucose: 98 mg/dl COMPARISON:  Chest radiograph 03/11/2017 FINDINGS: Mediastinal blood pool activity: SUV max 2.9 Liver activity: SUV max 3.8 NECK: Mildly asymmetric palatine tonsillar activity, maximum SUV 7.2 on the right and 5.7 cm on the left, most likely physiologic in this context. Accentuated activity along the teeth with associated periapical lucencies, favoring tooth decay/dental disease. Incidental CT findings: None CHEST: Hypermetabolic prevascular, right paratracheal, left paratracheal, AP  window, subcarinal, bilateral hilar, right internal mammary, and lower thoracic para-aortic adenopathy. Index subcarinal node 1.4 cm in short axis on image 67/4, maximum SUV 14.8. Index right internal mammary lymph node 0.9 cm in short axis on image 62/4, maximum SUV 7.8. Index right lower paratracheal lymph node 1.8 cm in short axis on image 61/4, maximum SUV 15.6. Some of these lymph nodes are partially calcified. A 0.6 by 0.5 cm nodule along the right middle lobe side of the minor fissure has a maximum SUV of 1.6, this lesion is technically too small to characterize but the measurable activity does raise some concern for possible involvement. Pleural based nodules bilaterally are too small to characterize, including a 0.5 by 0.3 cm nodule along the left lower lobe on image 44/8 and small subpleural nodules along the lung apices. Surveillance imaging is likely warranted. Incidental CT findings: none ABDOMEN/PELVIS: Notably hypermetabolic large cervical mass, maximum SUV 25.8. Distended endometrial cavity. A left fundal a lesion which could be ovarian or a fundal fibroid has maximum SUV of 6.0. Peripancreatic node measuring 1.1 cm in short axis on image 100/4 has maximum SUV of 4.2, mildly above liver activity.  A portacaval node measuring 0.8 cm in short axis on image 101/4 has maximum SUV of 4.9. A small lower aortocaval lymph node measuring 0.6 cm in short axis on image 127/4 has maximum SUV of 4.4. While these nodes are mildly elevated in activity, there certainly strikingly less hypermetabolic than the thoracic lymph nodes. Incidental CT findings: Cholelithiasis. SKELETON: Heterogeneously accentuated activity in the skeleton but without definite CT correlate. L3 vertebral body maximum SUV 6.7 I am skeptical of diffuse osseous metastatic disease as a cause for this heterogeneous activity given the lack of correlate of CT findings. Incidental CT findings: Degenerative facet arthropathy in the lower lumbar spine.  Mesoacromial os acromiale on the left. IMPRESSION: 1. Highly hypermetabolic cervical mass, with fluid distended endometrial cavity. Maximum SUV of the cervical mass is 25.8. 2. Left adnexal lesion versus more likely fundal fibroid, maximum SUV 6.0. Surveillance suggested. 3. There is low-grade activity in several abdominal lymph nodes including a borderline enlarged peripancreatic lymph node. However, there is striking activity in numerous enlarged thoracic lymph nodes, some of which have associated calcifications. It would be unusual to have this degree of thoracic adenopathy from cervical cancer without having a commensurate level of abdominopelvic adenopathy. This along with the calcifications of the lymph nodes raises the possibility of active granulomatous process (such as sarcoidosis) as a potential cause for the striking thoracic adenopathy. Concomitant lymphoma might also have a similar appearance. Sampling of thoracic lymph nodes may be helpful in differentiating metastatic disease other entities such as sarcoid or lymphoma. 4. Mild pleural/subpleural nodularity in the lungs, merit surveillance. 5. Cholelithiasis. Electronically Signed   By: Van Clines M.D.   On: 11/29/2019 16:59   US BREAST LTD UNI LEFT INC AXILLA  Result Date: 11/23/2019 CLINICAL DATA:  Patient was called back for focal asymmetries in the upper outer breast EXAM: DIGITAL DIAGNOSTIC LEFT MAMMOGRAM WITH TOMO ULTRASOUND LEFT BREAST COMPARISON:  Recent screening mammography ACR Breast Density Category b: There are scattered areas of fibroglandular density. FINDINGS: The focal asymmetries in the upper outer left breast persists on additional imaging. On physical exam, no suspicious lumps are identified. Targeted ultrasound is performed, showing no sonographic correlate for the left breast focal asymmetries. IMPRESSION: Probably benign left breast focal asymmetries scattered throughout the upper outer quadrant. RECOMMENDATION:  Six-month follow-up mammography of the left breast to ensure stability of the scattered focal asymmetries in the upper outer left breast. I have discussed the findings and recommendations with the patient. If applicable, a reminder letter will be sent to the patient regarding the next appointment. BI-RADS CATEGORY  3: Probably benign. Electronically Signed   By: Dorise Bullion III M.D   On: 11/23/2019 13:46   MS DIGITAL DIAG TOMO UNI LEFT  Result Date: 11/23/2019 CLINICAL DATA:  Patient was called back for focal asymmetries in the upper outer breast EXAM: DIGITAL DIAGNOSTIC LEFT MAMMOGRAM WITH TOMO ULTRASOUND LEFT BREAST COMPARISON:  Recent screening mammography ACR Breast Density Category b: There are scattered areas of fibroglandular density. FINDINGS: The focal asymmetries in the upper outer left breast persists on additional imaging. On physical exam, no suspicious lumps are identified. Targeted ultrasound is performed, showing no sonographic correlate for the left breast focal asymmetries. IMPRESSION: Probably benign left breast focal asymmetries scattered throughout the upper outer quadrant. RECOMMENDATION: Six-month follow-up mammography of the left breast to ensure stability of the scattered focal asymmetries in the upper outer left breast. I have discussed the findings and recommendations with the patient. If applicable, a reminder letter  will be sent to the patient regarding the next appointment. BI-RADS CATEGORY  3: Probably benign. Electronically Signed   By: Dorise Bullion III M.D   On: 11/23/2019 13:46    Labs:  CBC: Recent Labs    11/03/19 1131 12/15/19 1207 12/16/19 0428 12/22/19 1225  WBC 9.4 8.4 5.4 10.0  HGB 9.9* 9.9* 10.0* 11.6*  HCT 32.1* 33.6* 33.3* 39.2  PLT 325 342 364 395    COAGS: Recent Labs    12/15/19 1207  INR 1.1  APTT 27    BMP: Recent Labs    10/25/19 0856 11/03/19 1131 12/15/19 1207 12/16/19 0428  NA 139 141 141 140  K 3.6 3.7 3.5 3.9  CL 104  105 106 104  CO2 25 24 23 24   GLUCOSE 111* 104* 88 118*  BUN 7 <5* 6 9  CALCIUM 9.0 8.9 9.2 9.1  CREATININE 0.80 0.75 0.90 0.98  GFRNONAA >60 >60 >60 >60  GFRAA >60 >60 >60 >60    LIVER FUNCTION TESTS: Recent Labs    10/25/19 0856 11/03/19 1131 12/15/19 1207  BILITOT 0.7 0.6 0.5  AST 20 18 19   ALT 14 12 13   ALKPHOS 70 62 63  PROT 7.5 7.4 7.2  ALBUMIN 3.3* 3.5 3.5     Assessment and Plan:  Cervical cancer with tentative plans to begin systemic chemotherapy for management. Plan for image-guided Port-a-cath placement today in IR. Patient is NPO. Afebrile.  Risks and benefits of image-guided Port-a-catheter placement were discussed with the patient including, but not limited to bleeding, infection, pneumothorax, or fibrin sheath development and need for additional procedures. All of the patient's questions were answered, patient is agreeable to proceed. Consent signed and in chart.   Thank you for this interesting consult.  I greatly enjoyed meeting Anne Harris and look forward to participating in their care.  A copy of this report was sent to the requesting provider on this date.  Electronically Signed: Earley Abide, PA-C 12/22/2019, 1:11 PM   I spent a total of 30 Minutes in face to face in clinical consultation, greater than 50% of which was counseling/coordinating care for cervical cancer/Port-a-cath placement.

## 2019-12-22 NOTE — Discharge Instructions (Signed)
You may remove the bandaid and dressing tomorrow after 2:00pm, shower, you do not have to put anything else back over the site. Pat the area dry, it will look bruised, it will be sore. Do not use EMLA cream on your port until it has healed. (This usually takes 2 weeks) Do not pick at the skin glue as it is coming off. The petroleum in the Lidocaine cream (EMLA) will dissolve the skin glue and the skin will come apart. This will result in an infection over your new port. Use ice in a zip lock bag for 2- 3 minutes before they access your port. You can take what you usually take for aches and pains.     Implanted Port Insertion, Care After This sheet gives you information about how to care for yourself after your procedure. Your health care provider may also give you more specific instructions. If you have problems or questions, contact your health care provider. What can I expect after the procedure? After the procedure, it is common to have:  Discomfort at the port insertion site.  Bruising on the skin over the port. This should improve over 3-4 days. Follow these instructions at home: [facility] care  After your port is placed, you will get a manufacturer's information card. The card has information about your port. Keep this card with you at all times.  Take care of the port as told by your health care provider. Ask your health care provider if you or a family member can get training for taking care of the port at home. A home health care nurse may also take care of the port.  Make sure to remember what type of port you have. Incision care      Follow instructions from your health care provider about how to take care of your port insertion site. Make sure you: ? Wash your hands with soap and water before and after you change your bandage (dressing). If soap and water are not available, use hand sanitizer. ? Change your dressing as told by your health care provider.You may remove dressing  12/23/19 and shower around 4 PM. ? Leave  skin glue in place. These skin closures may need to stay in place for 2 weeks or longer.  Check your port insertion site every day for signs of infection. Check for: ? Redness, swelling, or pain. ? Fluid or blood. ? Warmth. ? Pus or a bad smell. Activity  Return to your normal activities as told by your health care provider. Ask your health care provider what activities are safe for you.  Do not lift anything that is heavier than 10 lb (4.5 kg), or the limit that you are told, until your health care provider says that it is safe. General instructions  Take over-the-counter and prescription medicines only as told by your health care provider.  Do not take baths, swim, or use a hot tub until your health care provider approves.You may shower 12/23/19 around 4 PM.  Do not drive for 24 hours if you were given a sedative during your procedure.  Wear a medical alert bracelet in case of an emergency. This will tell any health care providers that you have a port.  Keep all follow-up visits as told by your health care provider. This is important. Contact a health care provider if:  You cannot flush your port with saline as directed, or you cannot draw blood from the port.  You have a fever or chills.  You  have redness, swelling, or pain around your port insertion site.  You have fluid or blood coming from your port insertion site.  Your port insertion site feels warm to the touch.  You have pus or a bad smell coming from the port insertion site. Get help right away if:  You have chest pain or shortness of breath.  You have bleeding from your port that you cannot control. Summary  Take care of the port as told by your health care provider. Keep the manufacturer's information card with you at all times.  Change your dressing as told by your health care provider.  Contact a health care provider if you have a fever or chills or if you have  redness, swelling, or pain around your port insertion site.  Keep all follow-up visits as told by your health care provider. This information is not intended to replace advice given to you by your health care provider. Make sure you discuss any questions you have with your health care provider. Document Revised: 12/15/2017 Document Reviewed: 12/15/2017 Elsevier Patient Education  [place].      Moderate Conscious Sedation, Adult, Care After These instructions provide you with information about caring for yourself after your procedure. Your health care provider may also give you more specific instructions. Your treatment has been planned according to current medical practices, but problems sometimes occur. Call your health care provider if you have any problems or questions after your procedure. What can I expect after the procedure? After your procedure, it is common:  To feel sleepy for several hours.  To feel clumsy and have poor balance for several hours.  To have poor judgment for several hours.  To vomit if you eat too soon. Follow these instructions at home: For at least 24 hours after the procedure:   Do not: ? Participate in activities where you could fall or become injured. ? Drive. ? Use heavy machinery. ? Drink alcohol. ? Take sleeping pills or medicines that cause drowsiness. ? Make important decisions or sign legal documents. ? Take care of children on your own.  Rest. Eating and drinking  Follow the diet recommended by your health care provider.  If you vomit: ? Drink water, juice, or soup when you can drink without vomiting. ? Make sure you have little or no nausea before eating solid foods. General instructions  Have a responsible adult stay with you until you are awake and alert.  Take over-the-counter and prescription medicines only as told by your health care provider.  If you smoke, do not smoke without supervision.  Keep all follow-up  visits as told by your health care provider. This is important. Contact a health care provider if:  You keep feeling nauseous or you keep vomiting.  You feel light-headed.  You develop a rash.  You have a fever. Get help right away if:  You have trouble breathing. This information is not intended to replace advice given to you by your health care provider. Make sure you discuss any questions you have with your health care provider. Document Revised: 05/01/2017 Document Reviewed: 09/08/2015 Elsevier Patient Education  2020 [employer].

## 2019-12-23 ENCOUNTER — Telehealth: Payer: Self-pay

## 2019-12-23 ENCOUNTER — Other Ambulatory Visit: Payer: Self-pay | Admitting: Hematology and Oncology

## 2019-12-23 DIAGNOSIS — E876 Hypokalemia: Secondary | ICD-10-CM

## 2019-12-23 DIAGNOSIS — C539 Malignant neoplasm of cervix uteri, unspecified: Secondary | ICD-10-CM

## 2019-12-23 MED ORDER — POTASSIUM CHLORIDE CRYS ER 20 MEQ PO TBCR
20.0000 meq | EXTENDED_RELEASE_TABLET | Freq: Every day | ORAL | 0 refills | Status: DC
Start: 2019-12-23 — End: 2020-02-02

## 2019-12-23 NOTE — Telephone Encounter (Signed)
Called per Dr. Alvy Bimler. Given potassium results of 2.8. Instructed to discontinue HCTZ and start KCL daily, Rx sent to pharmacy already by Dr. Alvy Bimler. She verbalized understanding.

## 2019-12-23 NOTE — Progress Notes (Signed)
Pharmacist Chemotherapy Monitoring - Initial Assessment    Anticipated start date: 12/29/19  Regimen:  . Are orders appropriate based on the patient's diagnosis, regimen, and cycle? Yes . Does the plan date match the patient's scheduled date? Yes . Is the sequencing of drugs appropriate? Yes . Are the premedications appropriate for the patient's regimen? Yes . Prior Authorization for treatment is: Uninsured o If applicable, is the correct biosimilar selected based on the patient's insurance? not applicable  Organ Function and Labs: Marland Kitchen Are dose adjustments needed based on the patient's renal function, hepatic function, or hematologic function? Yes . Are appropriate labs ordered prior to the start of patient's treatment? Yes . Other organ system assessment, if indicated: cisplatin: baseline audiogram . The following baseline labs, if indicated, have been ordered: cisplatin: K, Mg  Dose Assessment: . Are the drug doses appropriate? Yes . Are the following correct: o Drug concentrations Yes o IV fluid compatible with drug Yes o Administration routes Yes o Timing of therapy Yes . If applicable, does the patient have documented access for treatment and/or plans for port-a-cath placement? yes . If applicable, have lifetime cumulative doses been properly documented and assessed? not applicable Lifetime Dose Tracking  No doses have been documented on this patient for the following tracked chemicals: Doxorubicin, Epirubicin, Idarubicin, Daunorubicin, Mitoxantrone, Bleomycin, Oxaliplatin, Carboplatin, Liposomal Doxorubicin  o   Toxicity Monitoring/Prevention: . The patient has the following take home antiemetics prescribed: Prochlorperazine . The patient has the following take home medications prescribed: N/A . Medication allergies and previous infusion related reactions, if applicable, have been reviewed and addressed. Yes . The patient's current medication list has been assessed for drug-drug  interactions with their chemotherapy regimen. no significant drug-drug interactions were identified on review.  Order Review: . Are the treatment plan orders signed? Yes . Is the patient scheduled to see a provider prior to their treatment? No  I verify that I have reviewed each item in the above checklist and answered each question accordingly.  Philomena Course 12/23/2019 9:06 AM

## 2019-12-25 NOTE — Progress Notes (Signed)
  Radiation Oncology         [phone] ________________________________  Name: Anne Harris MRN: [id number]  Date: 12/28/2019  DOB: Jan 01, 1961  Simulation Verification Note    ICD-10-CM   1. Squamous cell carcinoma of cervix (HCC)  C53.9     NARRATIVE: The patient was brought to the treatment unit and placed in the planned treatment position. The clinical setup was verified. Then port films were obtained and uploaded to the radiation oncology medical record software.  The treatment beams were carefully compared against the planned radiation fields. The position location and shape of the radiation fields was reviewed. The targeted volume of tissue appears to be appropriately covered by the radiation beams. Organs at risk appear to be excluded as planned.  Based on my personal review, I approved the simulation verification. The patient's treatment will proceed as planned.  -----------------------------------  Blair Promise, PhD, MD  This document serves as a record of services personally performed by Gery Pray, MD. It was created on his behalf by Clerance Lav, a trained medical scribe. The creation of this record is based on the scribe's personal observations and the provider's statements to them. This document has been checked and approved by the attending provider.

## 2019-12-26 ENCOUNTER — Other Ambulatory Visit: Payer: Self-pay | Admitting: Oncology

## 2019-12-26 ENCOUNTER — Telehealth: Payer: Self-pay | Admitting: Oncology

## 2019-12-26 NOTE — Progress Notes (Signed)
Gynecologic Oncology Multi-Disciplinary Disposition Conference Note  Date of the Conference: 12/26/2019  Patient Name: Anne Harris  Referring Provider: Dr. Elly Modena Primary GYN Oncologist: Dr. 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 Disposition is for no need for additional lung biopsy and to proceed with concurrent chemoradiation.   This Multidisciplinary conference took place involving physicians from [place], [place], Radiation Oncology, Pathology, Radiology along with the Gynecologic Oncology Nurse Practitioner and RN.  Comprehensive assessment of the patient's malignancy, staging, need for surgery, chemotherapy, radiation therapy, and need for further testing were reviewed. Supportive measures, both inpatient and following discharge were also discussed. The recommended plan of care is documented. Greater than 35 minutes were spent correlating and coordinating this patient's care.

## 2019-12-26 NOTE — Telephone Encounter (Signed)
Called Anne Harris and advised her of GYN Cancer Conference recommendation that she does not need an additional lung biopsy and to proceed with chemoradiation for cervical cancer.  She verbalized agreement and understanding.

## 2019-12-27 ENCOUNTER — Telehealth: Payer: Self-pay | Admitting: Hematology and Oncology

## 2019-12-27 DIAGNOSIS — Z510 Encounter for antineoplastic radiation therapy: Secondary | ICD-10-CM | POA: Diagnosis not present

## 2019-12-27 NOTE — Telephone Encounter (Signed)
Scheduled per 7/26 sch msg. Called pt and left a msg, mailing appt letter and calendar printout

## 2019-12-28 ENCOUNTER — Ambulatory Visit
Admission: RE | Admit: 2019-12-28 | Discharge: 2019-12-28 | Disposition: A | Discharge status: Home / Self Care | Payer: BC Managed Care – PPO | Source: Ambulatory Visit | Attending: Radiation Oncology | Admitting: Radiation Oncology

## 2019-12-28 ENCOUNTER — Other Ambulatory Visit: Payer: Self-pay

## 2019-12-28 DIAGNOSIS — Z510 Encounter for antineoplastic radiation therapy: Secondary | ICD-10-CM | POA: Diagnosis not present

## 2019-12-28 DIAGNOSIS — C539 Malignant neoplasm of cervix uteri, unspecified: Secondary | ICD-10-CM

## 2019-12-29 ENCOUNTER — Ambulatory Visit
Admission: RE | Admit: 2019-12-29 | Discharge: 2019-12-29 | Disposition: A | Discharge status: Home / Self Care | Payer: BC Managed Care – PPO | Source: Ambulatory Visit | Attending: Radiation Oncology | Admitting: Radiation Oncology

## 2019-12-29 ENCOUNTER — Inpatient Hospital Stay: Payer: BC Managed Care – PPO

## 2019-12-29 ENCOUNTER — Encounter: Payer: Self-pay | Admitting: Hematology and Oncology

## 2019-12-29 VITALS — BP 136/81 | HR 91 | Temp 98.9°F | Resp 18

## 2019-12-29 DIAGNOSIS — Z5111 Encounter for antineoplastic chemotherapy: Secondary | ICD-10-CM | POA: Diagnosis not present

## 2019-12-29 DIAGNOSIS — C539 Malignant neoplasm of cervix uteri, unspecified: Secondary | ICD-10-CM

## 2019-12-29 DIAGNOSIS — Z510 Encounter for antineoplastic radiation therapy: Secondary | ICD-10-CM | POA: Diagnosis not present

## 2019-12-29 LAB — CMP (CANCER CENTER ONLY)
ALT: 8 U/L (ref 0–44)
AST: 13 U/L — ABNORMAL LOW (ref 15–41)
Albumin: 3.1 g/dL — ABNORMAL LOW (ref 3.5–5.0)
Alkaline Phosphatase: 72 U/L (ref 38–126)
Anion gap: 9 (ref 5–15)
BUN: 6 mg/dL (ref 6–20)
CO2: 24 mmol/L (ref 22–32)
Calcium: 9.6 mg/dL (ref 8.9–10.3)
Chloride: 108 mmol/L (ref 98–111)
Creatinine: 0.83 mg/dL (ref 0.44–1.00)
GFR, Est AFR Am: >60 mL/min (ref >60)
GFR, Estimated: >60 mL/min (ref >60)
Glucose, Bld: 136 mg/dL — ABNORMAL HIGH (ref 70–99)
Potassium: 4 mmol/L (ref 3.5–5.1)
Sodium: 141 mmol/L (ref 135–145)
Total Bilirubin: 0.3 mg/dL (ref 0.3–1.2)
Total Protein: 6.9 g/dL (ref 6.5–8.1)

## 2019-12-29 LAB — CBC WITH DIFFERENTIAL (CANCER CENTER ONLY)
Abs Immature Granulocytes: 0.02 10*3/uL (ref 0.00–0.07)
Basophils Absolute: 0.1 10*3/uL (ref 0.0–0.1)
Basophils Relative: 1 %
Eosinophils Absolute: 0.5 10*3/uL (ref 0.0–0.5)
Eosinophils Relative: 6 %
HCT: 33.8 % — ABNORMAL LOW (ref 36.0–46.0)
Hemoglobin: 10.3 g/dL — ABNORMAL LOW (ref 12.0–15.0)
Immature Granulocytes: 0 %
Lymphocytes Relative: 10 %
Lymphs Abs: 0.9 10*3/uL (ref 0.7–4.0)
MCH: 24 pg — ABNORMAL LOW (ref 26.0–34.0)
MCHC: 30.5 g/dL (ref 30.0–36.0)
MCV: 78.6 fL — ABNORMAL LOW (ref 80.0–100.0)
Monocytes Absolute: 0.3 10*3/uL (ref 0.1–1.0)
Monocytes Relative: 3 %
Neutro Abs: 7.4 10*3/uL (ref 1.7–7.7)
Neutrophils Relative %: 80 %
Platelet Count: 277 10*3/uL (ref 150–400)
RBC: 4.3 MIL/uL (ref 3.87–5.11)
RDW: 14.7 % (ref 11.5–15.5)
WBC Count: 9.2 10*3/uL (ref 4.0–10.5)
nRBC: 0 % (ref 0.0–0.2)

## 2019-12-29 LAB — MAGNESIUM: Magnesium: 2 mg/dL (ref 1.7–2.4)

## 2019-12-29 MED ORDER — POTASSIUM CHLORIDE 2 MEQ/ML IV SOLN
Freq: Once | INTRAVENOUS | Status: AC
  Filled 2019-12-29: qty 10

## 2019-12-29 MED ORDER — SODIUM CHLORIDE 0.9 % IV SOLN
Freq: Once | INTRAVENOUS | Status: AC
  Filled 2019-12-29: qty 250

## 2019-12-29 MED ORDER — PALONOSETRON HCL INJECTION 0.25 MG/5ML
INTRAVENOUS | Status: AC
  Filled 2019-12-29: qty 5

## 2019-12-29 MED ORDER — SODIUM CHLORIDE 0.9 % IV SOLN
150.0000 mg | Freq: Once | INTRAVENOUS | Status: AC
  Administered 2019-12-29: 150 mg via INTRAVENOUS
  Filled 2019-12-29: qty 150

## 2019-12-29 MED ORDER — SODIUM CHLORIDE 0.9 % IV SOLN
40.0000 mg/m2 | Freq: Once | INTRAVENOUS | Status: AC
  Administered 2019-12-29: 74 mg via INTRAVENOUS
  Filled 2019-12-29: qty 74

## 2019-12-29 MED ORDER — SODIUM CHLORIDE 0.9% FLUSH
10.0000 mL | INTRAVENOUS | Status: DC | PRN
  Administered 2019-12-29: 10 mL
  Filled 2019-12-29: qty 10

## 2019-12-29 MED ORDER — HEPARIN SOD (PORK) LOCK FLUSH 100 UNIT/ML IV SOLN
500.0000 [IU] | Freq: Once | INTRAVENOUS | Status: AC | PRN
  Administered 2019-12-29: 500 [IU]
  Filled 2019-12-29: qty 5

## 2019-12-29 MED ORDER — PALONOSETRON HCL INJECTION 0.25 MG/5ML
0.2500 mg | Freq: Once | INTRAVENOUS | Status: AC
  Administered 2019-12-29: 0.25 mg via INTRAVENOUS

## 2019-12-29 MED ORDER — SODIUM CHLORIDE 0.9 % IV SOLN
10.0000 mg | Freq: Once | INTRAVENOUS | Status: AC
  Administered 2019-12-29: 10 mg via INTRAVENOUS
  Filled 2019-12-29: qty 10

## 2019-12-29 NOTE — Progress Notes (Signed)
Met with patient in treatment to introduce myself as Arboriculturist and to offer available resources.  Discussed one-time $1000 [employer] grant to assist with personal expenses while going through treatment.  Gave her my card if interested in applying and for any additional financial questions or concerns.

## 2019-12-29 NOTE — Patient Instructions (Signed)
[facility] Discharge Instructions for Patients Receiving Chemotherapy  Today you received the following chemotherapy agents: Cisplatin  To help prevent nausea and vomiting after your treatment, we encourage you to take your nausea medication as directed.   If you develop nausea and vomiting that is not controlled by your nausea medication, call the clinic.   BELOW ARE SYMPTOMS THAT SHOULD BE REPORTED IMMEDIATELY:  *FEVER GREATER THAN 100.5 F  *CHILLS WITH OR WITHOUT FEVER  NAUSEA AND VOMITING THAT IS NOT CONTROLLED WITH YOUR NAUSEA MEDICATION  *UNUSUAL SHORTNESS OF BREATH  *UNUSUAL BRUISING OR BLEEDING  TENDERNESS IN MOUTH AND THROAT WITH OR WITHOUT PRESENCE OF ULCERS  *URINARY PROBLEMS  *BOWEL PROBLEMS  UNUSUAL RASH Items with * indicate a potential emergency and should be followed up as soon as possible.  Feel free to call the clinic should you have any questions or concerns. The clinic phone number is [phone].  Please show the CHEMO ALERT CARD at check-in to the Emergency Department and triage nurse.  Cisplatin injection What is this medicine? CISPLATIN (SIS pla tin) is a chemotherapy drug. It targets fast dividing cells, like cancer cells, and causes these cells to die. This medicine is used to treat many types of cancer like bladder, ovarian, and testicular cancers. This medicine may be used for other purposes; ask your health care provider or pharmacist if you have questions. COMMON BRAND NAME(S): Platinol, Platinol -AQ What should I tell my health care provider before I take this medicine? They need to know if you have any of these conditions:  eye disease, vision problems  hearing problems  kidney disease  low blood counts, like white cells, platelets, or red blood cells  tingling of the fingers or toes, or other nerve disorder  an unusual or allergic reaction to cisplatin, carboplatin, oxaliplatin, other medicines, foods, dyes, or  preservatives  pregnant or trying to get pregnant  breast-feeding How should I use this medicine? This drug is given as an infusion into a vein. It is administered in a hospital or clinic by a specially trained health care professional. Talk to your pediatrician regarding the use of this medicine in children. Special care may be needed. Overdosage: If you think you have taken too much of this medicine contact a poison control center or emergency room at once. NOTE: This medicine is only for you. Do not share this medicine with others. What if I miss a dose? It is important not to miss a dose. Call your doctor or health care professional if you are unable to keep an appointment. What may interact with this medicine? This medicine may interact with the following medications:  foscarnet  certain antibiotics like amikacin, gentamicin, neomycin, polymyxin B, streptomycin, tobramycin, vancomycin This list may not describe all possible interactions. Give your health care provider a list of all the medicines, herbs, non-prescription drugs, or dietary supplements you use. Also tell them if you smoke, drink alcohol, or use illegal drugs. Some items may interact with your medicine. What should I watch for while using this medicine? Your condition will be monitored carefully while you are receiving this medicine. You will need important blood work done while you are taking this medicine. This drug may make you feel generally unwell. This is not uncommon, as chemotherapy can affect healthy cells as well as cancer cells. Report any side effects. Continue your course of treatment even though you feel ill unless your doctor tells you to stop. This medicine may increase your   risk of getting an infection. Call your healthcare professional for advice if you get a fever, chills, or sore throat, or other symptoms of a cold or flu. Do not treat yourself. Try to avoid being around people who are sick. Avoid taking  medicines that contain aspirin, acetaminophen, ibuprofen, naproxen, or ketoprofen unless instructed by your healthcare professional. These medicines may hide a fever. This medicine may increase your risk to bruise or bleed. Call your doctor or health care professional if you notice any unusual bleeding. Be careful brushing and flossing your teeth or using a toothpick because you may get an infection or bleed more easily. If you have any dental work done, tell your dentist you are receiving this medicine. Do not become pregnant while taking this medicine or for 14 months after stopping it. Women should inform their healthcare professional if they wish to become pregnant or think they might be pregnant. Men should not father a child while taking this medicine and for 11 months after stopping it. There is potential for serious side effects to an unborn child. Talk to your healthcare professional for more information. Do not breast-feed an infant while taking this medicine. This medicine has caused ovarian failure in some women. This medicine may make it more difficult to get pregnant. Talk to your healthcare professional if you are concerned about your fertility. This medicine has caused decreased sperm counts in some men. This may make it more difficult to father a child. Talk to your healthcare professional if you are concerned about your fertility. Drink fluids as directed while you are taking this medicine. This will help protect your kidneys. Call your doctor or health care professional if you get diarrhea. Do not treat yourself. What side effects may I notice from receiving this medicine? Side effects that you should report to your doctor or health care professional as soon as possible:  allergic reactions like skin rash, itching or hives, swelling of the face, lips, or tongue  blurred vision  changes in vision  decreased hearing or ringing of the ears  nausea, vomiting  pain, redness, or  irritation at site where injected  pain, tingling, numbness in the hands or feet  signs and symptoms of bleeding such as bloody or black, tarry stools; red or dark brown urine; spitting up blood or brown material that looks like coffee grounds; red spots on the skin; unusual bruising or bleeding from the eyes, gums, or nose  signs and symptoms of infection like fever; chills; cough; sore throat; pain or trouble passing urine  signs and symptoms of kidney injury like trouble passing urine or change in the amount of urine  signs and symptoms of low red blood cells or anemia such as unusually weak or tired; feeling faint or lightheaded; falls; breathing problems Side effects that usually do not require medical attention (report to your doctor or health care professional if they continue or are bothersome):  loss of appetite  mouth sores  muscle cramps This list may not describe all possible side effects. Call your doctor for medical advice about side effects. You may report side effects to FDA at 1-800-FDA-1088. Where should I keep my medicine? This drug is given in a hospital or clinic and will not be stored at home. NOTE: This sheet is a summary. It may not cover all possible information. If you have questions about this medicine, talk to your doctor, pharmacist, or health care provider.  2020 Elsevier/Gold Standard (2018-05-14 15:59:17)  

## 2019-12-30 ENCOUNTER — Ambulatory Visit
Admission: RE | Admit: 2019-12-30 | Discharge: 2019-12-30 | Disposition: A | Discharge status: Home / Self Care | Payer: BC Managed Care – PPO | Source: Ambulatory Visit | Attending: Radiation Oncology | Admitting: Radiation Oncology

## 2019-12-30 ENCOUNTER — Other Ambulatory Visit: Payer: Self-pay

## 2019-12-30 ENCOUNTER — Ambulatory Visit: Payer: Medicaid Other | Attending: Nurse Practitioner | Admitting: Nurse Practitioner

## 2019-12-30 ENCOUNTER — Encounter: Payer: Self-pay | Admitting: Nurse Practitioner

## 2019-12-30 ENCOUNTER — Other Ambulatory Visit: Payer: Self-pay | Admitting: Nurse Practitioner

## 2019-12-30 VITALS — Ht 64.0 in | Wt 243.0 lb

## 2019-12-30 DIAGNOSIS — Z6841 Body Mass Index (BMI) 40.0 and over, adult: Secondary | ICD-10-CM | POA: Insufficient documentation

## 2019-12-30 DIAGNOSIS — Z510 Encounter for antineoplastic radiation therapy: Secondary | ICD-10-CM | POA: Diagnosis not present

## 2019-12-30 DIAGNOSIS — Z7689 Persons encountering health services in other specified circumstances: Secondary | ICD-10-CM | POA: Diagnosis not present

## 2019-12-30 DIAGNOSIS — Z79899 Other long term (current) drug therapy: Secondary | ICD-10-CM | POA: Insufficient documentation

## 2019-12-30 DIAGNOSIS — I10 Essential (primary) hypertension: Secondary | ICD-10-CM | POA: Insufficient documentation

## 2019-12-30 DIAGNOSIS — Z1211 Encounter for screening for malignant neoplasm of colon: Secondary | ICD-10-CM

## 2019-12-30 DIAGNOSIS — Z7901 Long term (current) use of anticoagulants: Secondary | ICD-10-CM | POA: Insufficient documentation

## 2019-12-30 DIAGNOSIS — Z0120 Encounter for dental examination and cleaning without abnormal findings: Secondary | ICD-10-CM

## 2019-12-30 MED ORDER — AMLODIPINE BESYLATE 5 MG PO TABS: 5.0000 mg | ORAL_TABLET | Freq: Every day | ORAL | 1 refills | Status: DC

## 2019-12-30 NOTE — Progress Notes (Signed)
Virtual Visit via Telephone Note Due to national recommendations of social distancing due to [place] 19, telehealth visit is felt to be most appropriate for this patient at this time.  I discussed the limitations, risks, security and privacy concerns of performing an evaluation and management service by telephone and the availability of in person appointments. I also discussed with the patient that there may be a patient responsible charge related to this service. The patient expressed understanding and agreed to proceed.    I connected with Anne Harris on 12/30/19  at   1:50 PM EDT  EDT by telephone and verified that I am speaking with the correct person using two identifiers.   Consent I discussed the limitations, risks, security and privacy concerns of performing an evaluation and management service by telephone and the availability of in person appointments. I also discussed with the patient that there may be a patient responsible charge related to this service. The patient expressed understanding and agreed to proceed.   Location of Patient: Private Residence    [facility] Provider[facility]    Persons participating in Telemedicine visit: [name] FNP-BC [place]    History of Present Illness: Telemedicine visit for: Establish Care Undergoing chemotherapy and radiation weekly at this time for locally advanced cervical cancer. She started having post menopausal bleeding in May and was diagnosed shortly after with Stage IIb SCC of the cervix. She had not had a pap smear prior to 2021 since 30 years ago. Has also not seen a PCP in over 30 years.    Patient has been counseled on age-appropriate routine health concerns for screening and prevention. These are reviewed and up-to-date. Referrals have been placed accordingly. Immunizations are up-to-date or declined.    Colonoscopy: Referred to Gastroenterology   Essential  Hypertension Currently taking amlodipine 5 mg dialy. Denies chest pain, shortness of breath, palpitations, lightheadedness, dizziness, headaches or BLE edema.  BP Readings from Last 3 Encounters:  12/29/19 (!) 136/81  12/22/19 (!) 141/76  12/19/19 (!) 172/106    Past Medical History:  Diagnosis Date  . Abnormal Pap smear of cervix   . Cervical cancer ([place])   . Hypertension   . Mediastinal adenopathy   . Obesity, Class III, BMI 40-49.9 (morbid obesity) ([place])   . Wears glasses     Past Surgical History:  Procedure Laterality Date  . BRONCHIAL BIOPSY  12/15/2019   Procedure: BRONCHIAL BIOPSIES;  Surgeon: Garner Nash, DO;  Location: [facility];  Service: Pulmonary;;  . BRONCHIAL NEEDLE ASPIRATION BIOPSY  12/15/2019   Procedure: BRONCHIAL NEEDLE ASPIRATION BIOPSIES;  Surgeon: Garner Nash, DO;  Location: [facility];  Service: Pulmonary;;  . BRONCHIAL WASHINGS  12/15/2019   Procedure: BRONCHIAL WASHINGS;  Surgeon: Garner Nash, DO;  Location: [facility];  Service: Pulmonary;;  . CERVICAL BIOPSY    . ENDOBRONCHIAL ULTRASOUND N/A 12/15/2019   Procedure: ENDOBRONCHIAL ULTRASOUND;  Surgeon: Garner Nash, DO;  Location: [facility];  Service: Pulmonary;  Laterality: N/A;  . IR IMAGING GUIDED PORT INSERTION  12/22/2019    Family History  Problem Relation Age of Onset  . Colon cancer Mother 66  . Ovarian cancer Neg Hx   . Uterine cancer Neg Hx   . Breast cancer Neg Hx   . Cervical cancer Neg Hx     Social History   Socioeconomic History  . Marital status: Single    Spouse name: Not on file  .  Number of children: 6  . Years of education: Not on file  . Highest education level: High school graduate  Occupational History  . Occupation: care giver  Tobacco Use  . Smoking status: Never Smoker  . Smokeless tobacco: Never Used  Vaping Use  . Vaping Use: Never used  Substance and Sexual Activity  . Alcohol use: Yes    Comment: social  . Drug use: Never  . Sexual  activity: Not Currently  Other Topics Concern  . Not on file  Social History Narrative  . Not on file   Social Determinants of Health   Financial Resource Strain:   . Difficulty of Paying Living Expenses:   Food Insecurity:   . Worried About Charity fundraiser in the Last Year:   . Arboriculturist in the Last Year:   Transportation Needs: No Transportation Needs  . Lack of Transportation (Medical): No  . Lack of Transportation (Non-Medical): No  Physical Activity:   . Days of Exercise per Week:   . Minutes of Exercise per Session:   Stress:   . Feeling of Stress :   Social Connections:   . Frequency of Communication with Friends and Family:   . Frequency of Social Gatherings with Friends and Family:   . Attends Religious Services:   . Active Member of Clubs or Organizations:   . Attends Archivist Meetings:   Marland Kitchen Marital Status:      Observations/Objective: Awake, alert and oriented x 3   ROS  Assessment and Plan: [name] was seen today for establish care.  Diagnoses and all orders for this visit:  Encounter to establish care  Essential hypertension -     amLODipine (NORVASC) 5 MG tablet; Take 1 tablet (5 mg total) by mouth daily. Continue all antihypertensives as prescribed.  Remember to bring in your blood pressure log with you for your follow up appointment.  DASH/Mediterranean Diets are healthier choices for HTN.    Encounter for routine dental examination -     Ambulatory referral to Dentistry     Follow Up Instructions No follow-ups on file.     I discussed the assessment and treatment plan with the patient. The patient was provided an opportunity to ask questions and all were answered. The patient agreed with the plan and demonstrated an understanding of the instructions.   The patient was advised to call back or seek an in-person evaluation if the symptoms worsen or if the condition fails to improve as anticipated.  I provided 14 minutes  of non-face-to-face time during this encounter including median intraservice time, reviewing previous notes, labs, imaging, medications and explaining diagnosis and management.  Gildardo Pounds, FNP-BC

## 2019-12-31 ENCOUNTER — Encounter: Payer: Self-pay | Admitting: Nurse Practitioner

## 2020-01-01 ENCOUNTER — Emergency Department (HOSPITAL_COMMUNITY): Payer: BC Managed Care – PPO

## 2020-01-01 ENCOUNTER — Emergency Department (HOSPITAL_COMMUNITY)
Admission: EM | Admit: 2020-01-01 | Discharge: 2020-01-01 | Disposition: A | Discharge status: Home / Self Care | Payer: BC Managed Care – PPO | Attending: Emergency Medicine | Admitting: Emergency Medicine

## 2020-01-01 ENCOUNTER — Encounter (HOSPITAL_COMMUNITY): Payer: Self-pay | Admitting: Obstetrics and Gynecology

## 2020-01-01 ENCOUNTER — Other Ambulatory Visit: Payer: Self-pay

## 2020-01-01 DIAGNOSIS — I10 Essential (primary) hypertension: Secondary | ICD-10-CM | POA: Diagnosis not present

## 2020-01-01 DIAGNOSIS — Z8541 Personal history of malignant neoplasm of cervix uteri: Secondary | ICD-10-CM | POA: Diagnosis not present

## 2020-01-01 DIAGNOSIS — N390 Urinary tract infection, site not specified: Secondary | ICD-10-CM | POA: Insufficient documentation

## 2020-01-01 DIAGNOSIS — Z79899 Other long term (current) drug therapy: Secondary | ICD-10-CM | POA: Diagnosis not present

## 2020-01-01 DIAGNOSIS — R300 Dysuria: Secondary | ICD-10-CM | POA: Diagnosis present

## 2020-01-01 LAB — URINALYSIS, ROUTINE W REFLEX MICROSCOPIC
Bilirubin Urine: NEGATIVE
Glucose, UA: NEGATIVE mg/dL
Ketones, ur: NEGATIVE mg/dL
Nitrite: NEGATIVE
Protein, ur: NEGATIVE mg/dL
Specific Gravity, Urine: 1.013 (ref 1.005–1.030)
pH: 6 (ref 5.0–8.0)

## 2020-01-01 LAB — CBC WITH DIFFERENTIAL/PLATELET
Abs Immature Granulocytes: 0.02 10*3/uL (ref 0.00–0.07)
Basophils Absolute: 0.1 10*3/uL (ref 0.0–0.1)
Basophils Relative: 1 %
Eosinophils Absolute: 0.1 10*3/uL (ref 0.0–0.5)
Eosinophils Relative: 1 %
HCT: 32.9 % — ABNORMAL LOW (ref 36.0–46.0)
Hemoglobin: 9.9 g/dL — ABNORMAL LOW (ref 12.0–15.0)
Immature Granulocytes: 0 %
Lymphocytes Relative: 11 %
Lymphs Abs: 0.9 10*3/uL (ref 0.7–4.0)
MCH: 23.5 pg — ABNORMAL LOW (ref 26.0–34.0)
MCHC: 30.1 g/dL (ref 30.0–36.0)
MCV: 78.1 fL — ABNORMAL LOW (ref 80.0–100.0)
Monocytes Absolute: 0.4 10*3/uL (ref 0.1–1.0)
Monocytes Relative: 5 %
Neutro Abs: 6.9 10*3/uL (ref 1.7–7.7)
Neutrophils Relative %: 82 %
Platelets: 247 10*3/uL (ref 150–400)
RBC: 4.21 MIL/uL (ref 3.87–5.11)
RDW: 14.7 % (ref 11.5–15.5)
WBC: 8.4 10*3/uL (ref 4.0–10.5)
nRBC: 0 % (ref 0.0–0.2)

## 2020-01-01 LAB — COMPREHENSIVE METABOLIC PANEL
ALT: 10 U/L (ref 0–44)
AST: 12 U/L — ABNORMAL LOW (ref 15–41)
Albumin: 3.2 g/dL — ABNORMAL LOW (ref 3.5–5.0)
Alkaline Phosphatase: 61 U/L (ref 38–126)
Anion gap: 8 (ref 5–15)
BUN: 10 mg/dL (ref 6–20)
CO2: 27 mmol/L (ref 22–32)
Calcium: 8.8 mg/dL — ABNORMAL LOW (ref 8.9–10.3)
Chloride: 103 mmol/L (ref 98–111)
Creatinine, Ser: 0.77 mg/dL (ref 0.44–1.00)
GFR calc Af Amer: >60 mL/min (ref >60)
GFR calc non Af Amer: >60 mL/min (ref >60)
Glucose, Bld: 96 mg/dL (ref 70–99)
Potassium: 4 mmol/L (ref 3.5–5.1)
Sodium: 138 mmol/L (ref 135–145)
Total Bilirubin: 0.6 mg/dL (ref 0.3–1.2)
Total Protein: 7.1 g/dL (ref 6.5–8.1)

## 2020-01-01 LAB — PREGNANCY, URINE: Preg Test, Ur: NEGATIVE

## 2020-01-01 MED ORDER — PHENAZOPYRIDINE HCL 200 MG PO TABS
200.0000 mg | ORAL_TABLET | Freq: Three times a day (TID) | ORAL | 0 refills | Status: DC
Start: 2020-01-01 — End: 2020-02-02

## 2020-01-01 MED ORDER — CEPHALEXIN 500 MG PO CAPS
500.0000 mg | ORAL_CAPSULE | Freq: Two times a day (BID) | ORAL | 0 refills | Status: AC
Start: 2020-01-01 — End: 2020-01-08

## 2020-01-01 MED ORDER — HYDROCODONE-ACETAMINOPHEN 5-325 MG PO TABS: 1.0000 | ORAL_TABLET | ORAL | 0 refills | Status: DC | PRN

## 2020-01-01 MED ORDER — ONDANSETRON HCL 4 MG/2ML IJ SOLN
4.0000 mg | Freq: Once | INTRAMUSCULAR | Status: AC
  Administered 2020-01-01: 4 mg via INTRAVENOUS
  Filled 2020-01-01: qty 2

## 2020-01-01 MED ORDER — IOHEXOL 300 MG/ML  SOLN
100.0000 mL | Freq: Once | INTRAMUSCULAR | Status: AC | PRN
  Administered 2020-01-01: 100 mL via INTRAVENOUS

## 2020-01-01 MED ORDER — MORPHINE SULFATE (PF) 4 MG/ML IV SOLN
4.0000 mg | Freq: Once | INTRAVENOUS | Status: AC
  Administered 2020-01-01: 4 mg via INTRAVENOUS
  Filled 2020-01-01: qty 1

## 2020-01-01 MED ORDER — SODIUM CHLORIDE (PF) 0.9 % IJ SOLN
INTRAMUSCULAR | Status: AC
  Filled 2020-01-01: qty 50

## 2020-01-01 MED ORDER — ONDANSETRON 4 MG PO TBDP
4.0000 mg | ORAL_TABLET | Freq: Three times a day (TID) | ORAL | 0 refills | Status: DC | PRN
Start: 2020-01-01 — End: 2021-02-28

## 2020-01-01 MED ORDER — SODIUM CHLORIDE 0.9 % IV SOLN
1.0000 g | Freq: Once | INTRAVENOUS | Status: AC
  Administered 2020-01-01: 1 g via INTRAVENOUS
  Filled 2020-01-01: qty 10

## 2020-01-01 MED ORDER — SODIUM CHLORIDE 0.9 % IV BOLUS
1000.0000 mL | Freq: Once | INTRAVENOUS | Status: AC
  Administered 2020-01-01: 1000 mL via INTRAVENOUS

## 2020-01-01 MED ORDER — SODIUM CHLORIDE 0.9% FLUSH: 3.0000 mL | Freq: Once | INTRAVENOUS | Status: DC

## 2020-01-01 NOTE — ED Notes (Signed)
Transported to CT 

## 2020-01-01 NOTE — ED Provider Notes (Signed)
[facility] DEPT Provider Note   CSN: [id number] Arrival date & time: 01/01/20  5093     History Chief Complaint  Patient presents with  . Dysuria  . Cancer  . Pelvic Pain    Anne Harris is a 59 y.o. female with known stage II cervical cancer currently undergoing chemo, radiation with hypertension, mediastinal lymphadenopathy who presents for evaluation of abdominal pain and burning with urination.  Symptoms began yesterday.  Rates her pain a 10/10.  Has been on radiation x1 week and started chemotherapy with first session on Thursday.  No new vaginal bleeding, vaginal discharge.  Last bowel movement this morning without melena or bright blood per rectum.  No fever, chills, nausea, vomiting, chest pain, shortness of breath, hemoptysis, unilateral leg swelling, redness, warmth.  No pain with bowel movements, rectal bleeding.  Not currently sexually active.  Denies additional aggravating or relieving factors.  She has not taken anything for symptoms.  History obtained from patient and past medical records.  No interpreter used.  HPI     Past Medical History:  Diagnosis Date  . Abnormal Pap smear of cervix   . Cervical cancer ([place])   . Hypertension   . Mediastinal adenopathy   . Obesity, Class III, BMI 40-49.9 (morbid obesity) ([place])   . Wears glasses     Patient Active Problem List   Diagnosis Date Noted  . Hypokalemia 12/23/2019  . Hypertensive urgency 12/15/2019  . Hypertension   . Obesity, Class III, BMI 40-49.9 (morbid obesity) ([place])   . Anemia due to chronic illness   . Mediastinal adenopathy   . 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 BRONCHIAL BIOPSY  12/15/2019   Procedure: BRONCHIAL BIOPSIES;  Surgeon: Garner Nash, DO;  Location: [facility];  Service: Pulmonary;;  . BRONCHIAL NEEDLE ASPIRATION BIOPSY  12/15/2019   Procedure: BRONCHIAL NEEDLE ASPIRATION  BIOPSIES;  Surgeon: Garner Nash, DO;  Location: [facility];  Service: Pulmonary;;  . BRONCHIAL WASHINGS  12/15/2019   Procedure: BRONCHIAL WASHINGS;  Surgeon: Garner Nash, DO;  Location: [facility];  Service: Pulmonary;;  . CERVICAL BIOPSY    . ENDOBRONCHIAL ULTRASOUND N/A 12/15/2019   Procedure: ENDOBRONCHIAL ULTRASOUND;  Surgeon: Garner Nash, DO;  Location: [facility];  Service: Pulmonary;  Laterality: N/A;  . IR IMAGING GUIDED PORT INSERTION  12/22/2019     OB History    Gravida  6   Para  6   Term      Preterm      AB      Living        SAB      TAB      Ectopic      Multiple      Live Births              Family History  Problem Relation Age of Onset  . Colon cancer Mother 37  . Ovarian cancer Neg Hx   . Uterine cancer Neg Hx   . Breast cancer Neg Hx   . Cervical cancer Neg Hx     Social History   Tobacco Use  . Smoking status: Never Smoker  . Smokeless tobacco: Never Used  Vaping Use  . Vaping Use: Never used  Substance Use Topics  . Alcohol use: Yes    Comment: social  . Drug use: Never    Home Medications Prior to Admission medications  Medication Sig Start Date End Date Taking? Authorizing Provider  acetaminophen (TYLENOL) 500 MG tablet Take 1,000 mg by mouth every 6 (six) hours as needed for moderate pain.    [provider]  amLODipine (NORVASC) 5 MG tablet Take 1 tablet (5 mg total) by mouth daily. 12/30/19 03/29/20  Gildardo Pounds, NP  cephALEXin (KEFLEX) 500 MG capsule Take 1 capsule (500 mg total) by mouth 2 (two) times daily for 7 days. 01/01/20 01/08/20  Caidyn Henricksen A, PA-C  HYDROcodone-acetaminophen (NORCO/VICODIN) 5-325 MG tablet Take 1 tablet by mouth every 4 (four) hours as needed. 01/01/20   Jame Morrell A, PA-C  lidocaine-prilocaine (EMLA) cream Apply to affected area once 12/14/19   Heath Lark, MD  ondansetron (ZOFRAN ODT) 4 MG disintegrating tablet Take 1 tablet (4 mg total) by mouth every 8  (eight) hours as needed for nausea or vomiting. 01/01/20   Sakura Denis A, PA-C  ondansetron (ZOFRAN) 8 MG tablet Take 1 tablet (8 mg total) by mouth every 8 (eight) hours as needed. Start on the third day after chemotherapy. 12/14/19   Heath Lark, MD  phenazopyridine (PYRIDIUM) 200 MG tablet Take 1 tablet (200 mg total) by mouth 3 (three) times daily. 01/01/20   Alyne Martinson A, PA-C  potassium chloride SA (KLOR-CON) 20 MEQ tablet Take 1 tablet (20 mEq total) by mouth daily. 12/23/19   Heath Lark, MD  prochlorperazine (COMPAZINE) 10 MG tablet Take 1 tablet (10 mg total) by mouth every 6 (six) hours as needed (Nausea or vomiting). 12/14/19   Heath Lark, MD    Allergies    Nsaids  Review of Systems   Review of Systems  Constitutional: Negative.   HENT: Negative.   Respiratory: Negative.   Cardiovascular: Negative.   Gastrointestinal: Positive for abdominal pain. Negative for abdominal distention, anal bleeding, blood in stool, constipation, diarrhea, nausea, rectal pain and vomiting.  Genitourinary: Positive for dysuria and pelvic pain. Negative for decreased urine volume, difficulty urinating, flank pain, frequency, genital sores, hematuria, menstrual problem, urgency, vaginal bleeding, vaginal discharge and vaginal pain.  Musculoskeletal: Negative.   Skin: Negative.   Neurological: Negative.   All other systems reviewed and are negative.   Physical Exam Updated Vital Signs BP (!) 158/90 (BP Location: Left Arm)   Pulse 95   Temp 99.3 F (37.4 C) (Oral)   Resp 19   SpO2 100%   Physical Exam Vitals and nursing note reviewed.  Constitutional:      General: She is not in acute distress.    Appearance: Normal appearance. She is well-developed. She is not ill-appearing or toxic-appearing.  HENT:     Head: Normocephalic and atraumatic.     Nose: Nose normal.     Mouth/Throat:     Mouth: Mucous membranes are moist.  Eyes:     Pupils: Pupils are equal, round, and reactive to  light.  Cardiovascular:     Rate and Rhythm: Normal rate.     Pulses: Normal pulses.     Heart sounds: Normal heart sounds.  Pulmonary:     Effort: Pulmonary effort is normal. No respiratory distress.     Breath sounds: Normal breath sounds.  Abdominal:     General: Bowel sounds are normal. There is no distension.     Palpations: Abdomen is soft. There is no mass.     Tenderness: There is abdominal tenderness in the right lower quadrant, suprapubic area and left lower quadrant. There is no right CVA tenderness, left CVA tenderness, guarding  or rebound.     Hernia: No hernia is present.    Genitourinary:    Labia:        Right: No rash, tenderness, lesion or injury.        Left: No rash, tenderness, lesion or injury.      Comments: Declined internal GU exam. No labial, erythema, warmth, fluctuance, induration.  No overlying skin changes to perineal region. Musculoskeletal:        General: Normal range of motion.     Cervical back: Normal range of motion.     Comments: Compartments soft. Moves all 4 extremities without difficulty. Homans sign negative  Skin:    General: Skin is warm and dry.     Capillary Refill: Capillary refill takes less than 2 seconds.     Comments: No edema, erythema, warmth. No fluctuance induration  Neurological:     Mental Status: She is alert.     Cranial Nerves: Cranial nerves are intact.     Sensory: Sensation is intact.     Motor: Motor function is intact.     Coordination: Coordination is intact.     Gait: Gait is intact.     ED Results / Procedures / Treatments   Labs (all labs ordered are listed, but only abnormal results are displayed) Labs Reviewed  COMPREHENSIVE METABOLIC PANEL - Abnormal; Notable for the following components:      Result Value   Calcium 8.8 (*)    Albumin 3.2 (*)    AST 12 (*)    All other components within normal limits  CBC WITH DIFFERENTIAL/PLATELET - Abnormal; Notable for the following components:   Hemoglobin 9.9  (*)    HCT 32.9 (*)    MCV 78.1 (*)    MCH 23.5 (*)    All other components within normal limits  URINALYSIS, ROUTINE W REFLEX MICROSCOPIC - Abnormal; Notable for the following components:   Hgb urine dipstick SMALL (*)    Leukocytes,Ua LARGE (*)    Bacteria, UA RARE (*)    All other components within normal limits  URINE CULTURE  PREGNANCY, URINE    EKG None  Radiology CT Abdomen Pelvis W Contrast  Result Date: 01/01/2020 CLINICAL DATA:  Pelvic pain, negative beta HCG. EXAM: CT ABDOMEN AND PELVIS WITH CONTRAST TECHNIQUE: Multidetector CT imaging of the abdomen and pelvis was performed using the standard protocol following bolus administration of intravenous contrast. CONTRAST:  133mL OMNIPAQUE IOHEXOL 300 MG/ML  SOLN COMPARISON:  CT abdomen and pelvis dated 12/02/2019. FINDINGS: Lower chest: Mild bibasilar atelectasis. Hepatobiliary: Probable cholelithiasis. No pericholecystic inflammation. No focal liver abnormality. No bile duct dilatation. Pancreas: Unremarkable. No pancreatic ductal dilatation or surrounding inflammatory changes. Spleen: Normal in size without focal abnormality. Adrenals/Urinary Tract: Adrenal glands appear normal. Kidneys are unremarkable without mass, stone or hydronephrosis. No ureteral or bladder calculi are identified. Bladder is unremarkable, partially decompressed. Stomach/Bowel: No dilated large or small bowel loops. Scattered diverticulosis but no focal inflammatory change to suggest acute diverticulitis. Appendix is normal. Stomach is unremarkable, partially decompressed. Vascular/Lymphatic: No acute/significant vascular abnormality. No enlarged lymph nodes seen. Reproductive: Irregular masslike thickening at the level of the cervix is stable in the short-term interval. Associated fluid distending the endometrial canal indicating associated cervical stenosis/obstruction. Additional solid-appearing mass exophytic to the LEFT lateral uterus, measuring approximately 5  cm greatest dimension, most likely a fibroid. No free fluid or additional adnexal mass. Other: No abscess collection or free intraperitoneal air. Musculoskeletal: No acute or suspicious osseous finding. Degenerative  spondylosis of the mid and lower lumbar spine, mild to moderate in degree. IMPRESSION: 1. No acute findings within the abdomen or pelvis. No bowel obstruction or evidence of bowel wall inflammation. No renal or ureteral calculi. Appendix is normal. 2. Irregular masslike thickening is again seen at the level of the cervix, stable in the short-term interval, compatible with known cervical cancer (per clinical data provided on PET-CT of 11/29/2019). 3. Presumed fibroid exophytic to the LEFT lateral body of the uterus, measuring approximately 5 cm greatest dimension. 4. Colonic diverticulosis without evidence of acute diverticulitis. 5. Cholelithiasis without evidence of acute cholecystitis. Electronically Signed   By: Franki Cabot M.D.   On: 01/01/2020 12:44    Procedures Procedures (including critical care time)  Medications Ordered in ED Medications  sodium chloride flush (NS) 0.9 % injection 3 mL (3 mLs Intravenous Not Given 01/01/20 1124)  sodium chloride (PF) 0.9 % injection (has no administration in time range)  sodium chloride 0.9 % bolus 1,000 mL (0 mLs Intravenous Stopped 01/01/20 1308)  ondansetron (ZOFRAN) injection 4 mg (4 mg Intravenous Given 01/01/20 1123)  morphine 4 MG/ML injection 4 mg (4 mg Intravenous Given 01/01/20 1123)  cefTRIAXone (ROCEPHIN) 1 g in sodium chloride 0.9 % 100 mL IVPB (0 g Intravenous Stopped 01/01/20 1221)  iohexol (OMNIPAQUE) 300 MG/ML solution 100 mL (100 mLs Intravenous Contrast Given 01/01/20 1206)   ED Course  I have reviewed the triage vital signs and the nursing notes.  Pertinent labs & imaging results that were available during my care of the patient were reviewed by me and considered in my medical decision making (see chart for details).  59 year old  presents for evaluation of dysuria and pelvic pain.  Known history of cervical cancer currently undergoing chemo and radiation, last started treatment approximately 1 week ago.  She is afebrile, nonseptic, not ill-appearing.  She denies any vaginal discharge, vaginal bleeding, rectal pain.  Her last bowel movement was this morning without melena or bright blood per rectum.  She has diffuse tenderness to her lower abdomen however worse to her suprapubic region.  She is postmenopausal and is not currently sexually active.  She denies any nausea or vomiting.  Her heart and lungs are clear.  Moves all 4 extremities at difficulty.  She is neurovascularly intact.  No clinical evidence of DVT on exam.  No upper respiratory symptoms.  Plan on labs, imaging and reassess.  Labs and imaging personally reviewed and interpreted: CBC without leukocytosis, hemoglobin 9.9 similar to prior CMP with low calcium, low albumin, low AST, noticed electrolyte, renal abnormality Preg UA negative UA positive for infection Urine culture pending CTAP thickened cervix consistent with CA.  Patient reassessed. Declined internal GU exam.  External pelvic exam does not change evidence of overlying skin changes to suggest infectious process.   Patient reassessed.  Pain controlled here in the emergency department.  She is tolerating p.o. intake without difficulty.  Given Rocephin for UTI.  Will DC home with Keflex.  Given medication for symptomatic management.  Discussed close follow-up with OB/GYN.  She will return for any worsening symptoms.  Patient is nontoxic, nonseptic appearing, in no apparent distress.  Patient's pain and other symptoms adequately managed in emergency department.  Fluid bolus given.  Labs, imaging and vitals reviewed.  Patient does not meet the SIRS or Sepsis criteria.  On repeat exam patient does not have a surgical abdomin and there are no peritoneal signs.  No indication of appendicitis, bowel obstruction,  bowel perforation,  cholecystitis, diverticulitis, torsion, forniers gangrene, abscess, PID or ectopic pregnancy.   The patient has been appropriately medically screened and/or stabilized in the ED. I have low suspicion for any other emergent medical condition which would require further screening, evaluation or treatment in the ED or require inpatient management.  Patient is hemodynamically stable and in no acute distress.  Patient able to ambulate in department prior to ED.  Evaluation does not show acute pathology that would require ongoing or additional emergent interventions while in the emergency department or further inpatient treatment.  I have discussed the diagnosis with the patient and answered all questions.  Pain is been managed while in the emergency department and patient has no further complaints prior to discharge.  Patient is comfortable with plan discussed in room and is stable for discharge at this time.  I have discussed strict return precautions for returning to the emergency department.  Patient was encouraged to follow-up with PCP/specialist refer to at discharge.  Patient discussed with attending, Dr. Sedonia Small who agrees with treatment, plan and disposition.     MDM Rules/Calculators/A&P                           Final Clinical Impression(s) / ED Diagnoses Final diagnoses:  Lower urinary tract infectious disease  History of cervical cancer    Rx / DC Orders ED Discharge Orders         Ordered    phenazopyridine (PYRIDIUM) 200 MG tablet  3 times daily     Discontinue  Reprint     01/01/20 1408    HYDROcodone-acetaminophen (NORCO/VICODIN) 5-325 MG tablet  Every 4 hours PRN     Discontinue  Reprint     01/01/20 1408    ondansetron (ZOFRAN ODT) 4 MG disintegrating tablet  Every 8 hours PRN     Discontinue  Reprint     01/01/20 1408    cephALEXin (KEFLEX) 500 MG capsule  2 times daily     Discontinue  Reprint     01/01/20 1408           Mercades Bajaj A,  PA-C 01/01/20 1414    Maudie Flakes, MD 01/01/20 1510

## 2020-01-01 NOTE — ED Triage Notes (Signed)
Patient reports to the ER for painful urination and pelvic pain. Patient reports she has cervical cancer and is currently receiving chemo and radiation.

## 2020-01-01 NOTE — ED Notes (Signed)
Patient ambulatory to bathroom.

## 2020-01-01 NOTE — Discharge Instructions (Signed)
Take the medication as prescribed.  Return for new or worsening symptoms. 

## 2020-01-02 ENCOUNTER — Other Ambulatory Visit: Payer: Self-pay

## 2020-01-02 ENCOUNTER — Ambulatory Visit
Admission: RE | Admit: 2020-01-02 | Discharge: 2020-01-02 | Disposition: A | Discharge status: Home / Self Care | Payer: BC Managed Care – PPO | Source: Ambulatory Visit | Attending: Radiation Oncology | Admitting: Radiation Oncology

## 2020-01-02 ENCOUNTER — Inpatient Hospital Stay: Payer: BC Managed Care – PPO | Attending: Gynecologic Oncology

## 2020-01-02 ENCOUNTER — Inpatient Hospital Stay: Payer: BC Managed Care – PPO

## 2020-01-02 DIAGNOSIS — C539 Malignant neoplasm of cervix uteri, unspecified: Secondary | ICD-10-CM | POA: Diagnosis present

## 2020-01-02 DIAGNOSIS — Z452 Encounter for adjustment and management of vascular access device: Secondary | ICD-10-CM | POA: Diagnosis not present

## 2020-01-02 DIAGNOSIS — D509 Iron deficiency anemia, unspecified: Secondary | ICD-10-CM | POA: Insufficient documentation

## 2020-01-02 DIAGNOSIS — R197 Diarrhea, unspecified: Secondary | ICD-10-CM | POA: Diagnosis not present

## 2020-01-02 DIAGNOSIS — I10 Essential (primary) hypertension: Secondary | ICD-10-CM | POA: Insufficient documentation

## 2020-01-02 DIAGNOSIS — Z510 Encounter for antineoplastic radiation therapy: Secondary | ICD-10-CM | POA: Insufficient documentation

## 2020-01-02 DIAGNOSIS — Z5111 Encounter for antineoplastic chemotherapy: Secondary | ICD-10-CM | POA: Insufficient documentation

## 2020-01-02 LAB — CBC WITH DIFFERENTIAL (CANCER CENTER ONLY)
Abs Immature Granulocytes: 0.05 10*3/uL (ref 0.00–0.07)
Basophils Absolute: 0 10*3/uL (ref 0.0–0.1)
Basophils Relative: 0 %
Eosinophils Absolute: 0.2 10*3/uL (ref 0.0–0.5)
Eosinophils Relative: 2 %
HCT: 32 % — ABNORMAL LOW (ref 36.0–46.0)
Hemoglobin: 9.7 g/dL — ABNORMAL LOW (ref 12.0–15.0)
Immature Granulocytes: 1 %
Lymphocytes Relative: 8 %
Lymphs Abs: 0.7 10*3/uL (ref 0.7–4.0)
MCH: 23 pg — ABNORMAL LOW (ref 26.0–34.0)
MCHC: 30.3 g/dL (ref 30.0–36.0)
MCV: 75.8 fL — ABNORMAL LOW (ref 80.0–100.0)
Monocytes Absolute: 0.4 10*3/uL (ref 0.1–1.0)
Monocytes Relative: 5 %
Neutro Abs: 7 10*3/uL (ref 1.7–7.7)
Neutrophils Relative %: 84 %
Platelet Count: 249 10*3/uL (ref 150–400)
RBC: 4.22 MIL/uL (ref 3.87–5.11)
RDW: 14.6 % (ref 11.5–15.5)
WBC Count: 8.4 10*3/uL (ref 4.0–10.5)
nRBC: 0 % (ref 0.0–0.2)

## 2020-01-02 LAB — BASIC METABOLIC PANEL - CANCER CENTER ONLY
Anion gap: 9 (ref 5–15)
BUN: 9 mg/dL (ref 6–20)
CO2: 24 mmol/L (ref 22–32)
Calcium: 9.4 mg/dL (ref 8.9–10.3)
Chloride: 104 mmol/L (ref 98–111)
Creatinine: 0.81 mg/dL (ref 0.44–1.00)
GFR, Est AFR Am: >60 mL/min (ref >60)
GFR, Estimated: >60 mL/min (ref >60)
Glucose, Bld: 115 mg/dL — ABNORMAL HIGH (ref 70–99)
Potassium: 3.6 mmol/L (ref 3.5–5.1)
Sodium: 137 mmol/L (ref 135–145)

## 2020-01-02 LAB — MAGNESIUM: Magnesium: 2 mg/dL (ref 1.7–2.4)

## 2020-01-02 MED ORDER — HEPARIN SOD (PORK) LOCK FLUSH 100 UNIT/ML IV SOLN
250.0000 [IU] | Freq: Once | INTRAVENOUS | Status: AC
  Administered 2020-01-02: 500 [IU]
  Filled 2020-01-02: qty 5

## 2020-01-02 MED ORDER — SODIUM CHLORIDE 0.9% FLUSH
10.0000 mL | Freq: Once | INTRAVENOUS | Status: AC
  Administered 2020-01-02: 10 mL
  Filled 2020-01-02: qty 10

## 2020-01-02 NOTE — Patient Instructions (Signed)

## 2020-01-02 NOTE — Progress Notes (Signed)
Pt here for patient teaching.  Pt given Radiation and You booklet, skin care instructions, Alra deodorant and Sonafine.  Reviewed areas of pertinence such as diarrhea, fatigue, nausea and vomiting, skin changes and urinary and bladder changes . Pt able to give teach back of to pat skin, use unscented/gentle soap, use baby wipes, have Imodium on hand, drink plenty of water and sitz bath,. Pt demonstrated understanding of information given and will contact nursing with any questions or concerns.     [email or web address]

## 2020-01-03 ENCOUNTER — Encounter: Payer: Self-pay | Admitting: Hematology and Oncology

## 2020-01-03 ENCOUNTER — Ambulatory Visit
Admission: RE | Admit: 2020-01-03 | Discharge: 2020-01-03 | Disposition: A | Discharge status: Home / Self Care | Payer: BC Managed Care – PPO | Source: Ambulatory Visit | Attending: Radiation Oncology | Admitting: Radiation Oncology

## 2020-01-03 ENCOUNTER — Telehealth: Payer: Self-pay | Admitting: Hematology and Oncology

## 2020-01-03 ENCOUNTER — Other Ambulatory Visit: Payer: Self-pay

## 2020-01-03 ENCOUNTER — Inpatient Hospital Stay (HOSPITAL_BASED_OUTPATIENT_CLINIC_OR_DEPARTMENT_OTHER): Payer: BC Managed Care – PPO | Admitting: Hematology and Oncology

## 2020-01-03 DIAGNOSIS — Z510 Encounter for antineoplastic radiation therapy: Secondary | ICD-10-CM | POA: Diagnosis not present

## 2020-01-03 DIAGNOSIS — I10 Essential (primary) hypertension: Secondary | ICD-10-CM

## 2020-01-03 DIAGNOSIS — D638 Anemia in other chronic diseases classified elsewhere: Secondary | ICD-10-CM | POA: Diagnosis not present

## 2020-01-03 DIAGNOSIS — C539 Malignant neoplasm of cervix uteri, unspecified: Secondary | ICD-10-CM | POA: Diagnosis not present

## 2020-01-03 NOTE — Progress Notes (Signed)
[facility] OFF[facility] PROGRESS NOTE  Patient Care Team: Gildardo Pounds, NP as PCP - General (Nurse Practitioner) Jacqulyn Liner, RN as Oncology Nurse Navigator (Oncology)  ASSESSMENT & PLAN:  Squamous cell carcinoma of cervix [facility]) So far, she tolerated treatment well Her recent ER visit was due to dysuria but that has improved with antibiotics We will proceed with treatment as scheduled I will see her back next week for further follow-up  Anemia due to chronic illness She has multifactorial anemia, combination of anemia of chronic disease and iron deficiency I recommend a short course oral iron supplement If she does not improve, we can consider intravenous iron infusion  Hypertension Her blood pressure control is good We will continue close monitoring   No orders of the defined types were placed in this encounter.   All questions were answered. The patient knows to call the clinic with any problems, questions or concerns. The total time spent in the appointment was 20 minutes encounter with patients including review of chart and various tests results, discussions about plan of care and coordination of care plan   Heath Lark, MD 01/03/2020 1:30 PM  INTERVAL HISTORY: Please see below for problem oriented charting. She returns for further follow-up She went to the emergency department recently due to symptoms of dysuria and was prescribed antibiotics Symptoms have improved She denies nausea or changes in bowel habits No peripheral neuropathy from treatment  SUMMARY OF ONCOLOGIC HISTORY: Oncology History  Squamous cell carcinoma of cervix ([place])  10/21/2019 Initial Diagnosis   The patient presented to the emergency department on 5/25 and again on 6/3 for abdominal pain and postmenopausal bleeding that started 5/21.  On 6/10 she underwent Pap smear.  Last Pap smear had been 30 years prior and normal per patient.     11/21/2019 Pathology Results   A. CERVIX,  BIOPSY:  -  Squamous cell carcinoma    11/29/2019 PET scan   1. Highly hypermetabolic cervical mass, with fluid distended endometrial cavity. Maximum SUV of the cervical mass is 25.8. 2. Left adnexal lesion versus more likely fundal fibroid, maximum SUV 6.0. Surveillance suggested. 3. There is low-grade activity in several abdominal lymph nodes including a borderline enlarged peripancreatic lymph node. However, there is striking activity in numerous enlarged thoracic lymph nodes, some of which have associated calcifications. It would be unusual to have this degree of thoracic adenopathy from cervical cancer without having a commensurate level of abdominopelvic adenopathy. This along with the calcifications of the lymph nodes raises the possibility of active granulomatous process (such as sarcoidosis) as a potential cause for the striking thoracic adenopathy. Concomitant lymphoma might also have a similar appearance. Sampling of thoracic lymph nodes may be helpful in differentiating metastatic disease other entities such as sarcoid or Lymphoma.  4. Mild pleural/subpleural nodularity in the lungs, merit surveillance. 5. Cholelithiasis.   11/30/2019 Cancer Staging   Staging form: Cervix Uteri, AJCC Version 9 - Clinical stage from 11/30/2019: FIGO Stage IIB (cT2b, cN0, cM0) - Signed by Heath Lark, MD on 11/30/2019   12/02/2019 Imaging   Ct abdomen and pelvis 1. Cervical soft tissue mass as described in keeping with known malignancy. 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 2. Mildly rounded subcentimeter aortocaval and right pelvic sidewall lymph nodes. 3. A 5.3 x 3.9 cm left fundal exophytic soft tissue suboptimally characterized but possibly a fibroid. 4. Cholelithiasis. 5. No bowel obstruction. Normal appendix. 6. Small bilateral lung base subpleural nodules similar to prior  PET CT. Clinical correlation and follow-up recommended.   12/22/2019 Procedure    Successful placement of a right internal jugular approach power injectable Port-A-Cath. The catheter is ready for immediate use.     REVIEW OF SYSTEMS:   Constitutional: Denies fevers, chills or abnormal weight loss Eyes: Denies blurriness of vision Ears, nose, mouth, throat, and face: Denies mucositis or sore throat Respiratory: Denies cough, dyspnea or wheezes Cardiovascular: Denies palpitation, chest discomfort or lower extremity swelling Gastrointestinal:  Denies nausea, heartburn or change in bowel habits Skin: Denies abnormal skin rashes Lymphatics: Denies new lymphadenopathy or easy bruising Neurological:Denies numbness, tingling or new weaknesses Behavioral/Psych: Mood is stable, no new changes  All other systems were reviewed with the patient and are negative.  I have reviewed the past medical history, past surgical history, social history and family history with the patient and they are unchanged from previous note.  ALLERGIES:  is allergic to nsaids.  MEDICATIONS:  Current Outpatient Medications  Medication Sig Dispense Refill  . acetaminophen (TYLENOL) 500 MG tablet Take 1,000 mg by mouth every 6 (six) hours as needed for moderate pain.    Marland Kitchen amLODipine (NORVASC) 5 MG tablet Take 1 tablet (5 mg total) by mouth daily. 90 tablet 1  . cephALEXin (KEFLEX) 500 MG capsule Take 1 capsule (500 mg total) by mouth 2 (two) times daily for 7 days. 14 capsule 0  . HYDROcodone-acetaminophen (NORCO/VICODIN) 5-325 MG tablet Take 1 tablet by mouth every 4 (four) hours as needed. 8 tablet 0  . lidocaine-prilocaine (EMLA) cream Apply to affected area once 30 g 3  . ondansetron (ZOFRAN ODT) 4 MG disintegrating tablet Take 1 tablet (4 mg total) by mouth every 8 (eight) hours as needed for nausea or vomiting. 20 tablet 0  . ondansetron (ZOFRAN) 8 MG tablet Take 1 tablet (8 mg total) by mouth every 8 (eight) hours as needed. Start on the third day after chemotherapy. 30 tablet 1  . phenazopyridine  (PYRIDIUM) 200 MG tablet Take 1 tablet (200 mg total) by mouth 3 (three) times daily. 6 tablet 0  . potassium chloride SA (KLOR-CON) 20 MEQ tablet Take 1 tablet (20 mEq total) by mouth daily. 10 tablet 0  . prochlorperazine (COMPAZINE) 10 MG tablet Take 1 tablet (10 mg total) by mouth every 6 (six) hours as needed (Nausea or vomiting). 30 tablet 1   No current facility-administered medications for this visit.    PHYSICAL EXAMINATION: ECOG PERFORMANCE STATUS: 1 - Symptomatic but completely ambulatory  Vitals:   01/03/20 1001  BP: 132/89  Pulse: (!) 104  Resp: 18  Temp: 98.8 F (37.1 C)  SpO2: 98%   Filed Weights   01/03/20 1001  Weight: 233 lb 3.2 oz (105.8 kg)    GENERAL:alert, no distress and comfortable SKIN: skin color, texture, turgor are normal, no rashes or significant lesions EYES: normal, Conjunctiva are pink and non-injected, sclera clear OROPHARYNX:no exudate, no erythema and lips, buccal mucosa, and tongue normal  NECK: supple, thyroid normal size, non-tender, without nodularity LYMPH:  no palpable lymphadenopathy in the cervical, axillary or inguinal LUNGS: clear to auscultation and percussion with normal breathing effort HEART: regular rate & rhythm and no murmurs and no lower extremity edema ABDOMEN:abdomen soft, non-tender and normal bowel sounds Musculoskeletal:no cyanosis of digits and no clubbing  NEURO: alert & oriented x 3 with fluent speech, no focal motor/sensory deficits  LABORATORY DATA:  I have reviewed the data as listed    Component Value Date/Time   NA 137  01/02/2020 0954   K 3.6 01/02/2020 0954   CL 104 01/02/2020 0954   CO2 24 01/02/2020 0954   GLUCOSE 115 (H) 01/02/2020 0954   BUN 9 01/02/2020 0954   CREATININE 0.81 01/02/2020 0954   CALCIUM 9.4 01/02/2020 0954   PROT 7.1 01/01/2020 1113   ALBUMIN 3.2 (L) 01/01/2020 1113   AST 12 (L) 01/01/2020 1113   AST 13 (L) 12/29/2019 0746   ALT 10 01/01/2020 1113   ALT 8 12/29/2019 0746    ALKPHOS 61 01/01/2020 1113   BILITOT 0.6 01/01/2020 1113   BILITOT 0.3 12/29/2019 0746   GFRNONAA >60 01/02/2020 0954   GFRAA >60 01/02/2020 0954    No results found for: SPEP, UPEP  Lab Results  Component Value Date   WBC 8.4 01/02/2020   NEUTROABS 7.0 01/02/2020   HGB 9.7 (L) 01/02/2020   HCT 32.0 (L) 01/02/2020   MCV 75.8 (L) 01/02/2020   PLT 249 01/02/2020      Chemistry      Component Value Date/Time   NA 137 01/02/2020 0954   K 3.6 01/02/2020 0954   CL 104 01/02/2020 0954   CO2 24 01/02/2020 0954   BUN 9 01/02/2020 0954   CREATININE 0.81 01/02/2020 0954      Component Value Date/Time   CALCIUM 9.4 01/02/2020 0954   ALKPHOS 61 01/01/2020 1113   AST 12 (L) 01/01/2020 1113   AST 13 (L) 12/29/2019 0746   ALT 10 01/01/2020 1113   ALT 8 12/29/2019 0746   BILITOT 0.6 01/01/2020 1113   BILITOT 0.3 12/29/2019 0746       RADIOGRAPHIC STUDIES: I have personally reviewed the radiological images as listed and agreed with the findings in the report. CT Abdomen Pelvis W Contrast  Result Date: 01/01/2020 CLINICAL DATA:  Pelvic pain, negative beta HCG. EXAM: CT ABDOMEN AND PELVIS WITH CONTRAST TECHNIQUE: Multidetector CT imaging of the abdomen and pelvis was performed using the standard protocol following bolus administration of intravenous contrast. CONTRAST:  14m OMNIPAQUE IOHEXOL 300 MG/ML  SOLN COMPARISON:  CT abdomen and pelvis dated 12/02/2019. FINDINGS: Lower chest: Mild bibasilar atelectasis. Hepatobiliary: Probable cholelithiasis. No pericholecystic inflammation. No focal liver abnormality. No bile duct dilatation. Pancreas: Unremarkable. No pancreatic ductal dilatation or surrounding inflammatory changes. Spleen: Normal in size without focal abnormality. Adrenals/Urinary Tract: Adrenal glands appear normal. Kidneys are unremarkable without mass, stone or hydronephrosis. No ureteral or bladder calculi are identified. Bladder is unremarkable, partially decompressed.  Stomach/Bowel: No dilated large or small bowel loops. Scattered diverticulosis but no focal inflammatory change to suggest acute diverticulitis. Appendix is normal. Stomach is unremarkable, partially decompressed. Vascular/Lymphatic: No acute/significant vascular abnormality. No enlarged lymph nodes seen. Reproductive: Irregular masslike thickening at the level of the cervix is stable in the short-term interval. Associated fluid distending the endometrial canal indicating associated cervical stenosis/obstruction. Additional solid-appearing mass exophytic to the LEFT lateral uterus, measuring approximately 5 cm greatest dimension, most likely a fibroid. No free fluid or additional adnexal mass. Other: No abscess collection or free intraperitoneal air. Musculoskeletal: No acute or suspicious osseous finding. Degenerative spondylosis of the mid and lower lumbar spine, mild to moderate in degree. IMPRESSION: 1. No acute findings within the abdomen or pelvis. No bowel obstruction or evidence of bowel wall inflammation. No renal or ureteral calculi. Appendix is normal. 2. Irregular masslike thickening is again seen at the level of the cervix, stable in the short-term interval, compatible with known cervical cancer (per clinical data provided on PET-CT of 11/29/2019). 3.  Presumed fibroid exophytic to the LEFT lateral body of the uterus, measuring approximately 5 cm greatest dimension. 4. Colonic diverticulosis without evidence of acute diverticulitis. 5. Cholelithiasis without evidence of acute cholecystitis. Electronically Signed   By: Franki Cabot M.D.   On: 01/01/2020 12:44   IR IMAGING GUIDED PORT INSERTION  Result Date: 12/22/2019 INDICATION: History of cervical cancer. In need of durable intravenous access for chemotherapy administration. EXAM: IMPLANTED PORT A CATH PLACEMENT WITH ULTRASOUND AND FLUOROSCOPIC GUIDANCE COMPARISON:  PET-CT-11/29/2019 MEDICATIONS: Ancef 2 gm IV; The antibiotic was administered within  an appropriate time interval prior to skin puncture. ANESTHESIA/SEDATION: Moderate (conscious) sedation was employed during this procedure. A total of Versed 2 mg and Fentanyl 100 mcg was administered intravenously. Moderate Sedation Time: 30 minutes. The patient's level of consciousness and vital signs were monitored continuously by radiology nursing throughout the procedure under my direct supervision. CONTRAST:  None FLUOROSCOPY TIME:  24 seconds (13 mGy) COMPLICATIONS: None immediate. PROCEDURE: The procedure, risks, benefits, and alternatives were explained to the patient. Questions regarding the procedure were encouraged and answered. The patient understands and consents to the procedure. The right neck and chest were prepped with chlorhexidine in a sterile fashion, and a sterile drape was applied covering the operative field. Maximum barrier sterile technique with sterile gowns and gloves were used for the procedure. A timeout was performed prior to the initiation of the procedure. Local anesthesia was provided with 1% lidocaine with epinephrine. After creating a small venotomy incision, a micropuncture kit was utilized to access the internal jugular vein. Real-time ultrasound guidance was utilized for vascular access including the acquisition of a permanent ultrasound image documenting patency of the accessed vessel. The microwire was utilized to measure appropriate catheter length. A subcutaneous port pocket was then created along the upper chest wall utilizing a combination of sharp and blunt dissection. The pocket was irrigated with sterile saline. A single lumen "ISP" sized power injectable port was chosen for placement. The 8 Fr catheter was tunneled from the port pocket site to the venotomy incision. The port was placed in the pocket. The external catheter was trimmed to appropriate length. At the venotomy, an 8 Fr peel-away sheath was placed over a guidewire under fluoroscopic guidance. The catheter  was then placed through the sheath and the sheath was removed. Final catheter positioning was confirmed and documented with a fluoroscopic spot radiograph. The port was accessed with a Huber needle, aspirated and flushed with heparinized saline. The venotomy site was closed with an interrupted 4-0 Vicryl suture. The port pocket incision was closed with interrupted 2-0 Vicryl suture. The skin was opposed with a running subcuticular 4-0 Vicryl suture. Dermabond and Steri-strips were applied to both incisions. Dressings were applied. The patient tolerated the procedure well without immediate post procedural complication. FINDINGS: After catheter placement, the tip lies within the superior cavoatrial junction. The catheter aspirates and flushes normally and is ready for immediate use. IMPRESSION: Successful placement of a right internal jugular approach power injectable Port-A-Cath. The catheter is ready for immediate use. Electronically Signed   By: Sandi Mariscal M.D.   On: 12/22/2019 15:12

## 2020-01-03 NOTE — Telephone Encounter (Signed)
Scheduled appts per 8/3 sch msg. Gave pt print out of AVS.

## 2020-01-03 NOTE — Assessment & Plan Note (Signed)
Her blood pressure control is good We will continue close monitoring

## 2020-01-03 NOTE — Assessment & Plan Note (Signed)
So far, she tolerated treatment well Her recent ER visit was due to dysuria but that has improved with antibiotics We will proceed with treatment as scheduled I will see her back next week for further follow-up

## 2020-01-03 NOTE — Assessment & Plan Note (Signed)
She has multifactorial anemia, combination of anemia of chronic disease and iron deficiency I recommend a short course oral iron supplement If she does not improve, we can consider intravenous iron infusion

## 2020-01-04 ENCOUNTER — Ambulatory Visit
Admission: RE | Admit: 2020-01-04 | Discharge: 2020-01-04 | Disposition: A | Discharge status: Home / Self Care | Payer: BC Managed Care – PPO | Source: Ambulatory Visit | Attending: Radiation Oncology | Admitting: Radiation Oncology

## 2020-01-04 ENCOUNTER — Other Ambulatory Visit: Payer: Self-pay

## 2020-01-04 DIAGNOSIS — Z510 Encounter for antineoplastic radiation therapy: Secondary | ICD-10-CM | POA: Diagnosis not present

## 2020-01-04 LAB — URINE CULTURE

## 2020-01-05 ENCOUNTER — Other Ambulatory Visit: Payer: Self-pay

## 2020-01-05 ENCOUNTER — Ambulatory Visit
Admission: RE | Admit: 2020-01-05 | Discharge: 2020-01-05 | Disposition: A | Discharge status: Home / Self Care | Payer: BC Managed Care – PPO | Source: Ambulatory Visit | Attending: Radiation Oncology | Admitting: Radiation Oncology

## 2020-01-05 DIAGNOSIS — Z510 Encounter for antineoplastic radiation therapy: Secondary | ICD-10-CM | POA: Diagnosis not present

## 2020-01-06 ENCOUNTER — Other Ambulatory Visit: Payer: Self-pay

## 2020-01-06 ENCOUNTER — Inpatient Hospital Stay: Payer: BC Managed Care – PPO

## 2020-01-06 ENCOUNTER — Telehealth: Payer: Self-pay | Admitting: Pulmonary Disease

## 2020-01-06 ENCOUNTER — Ambulatory Visit
Admission: RE | Admit: 2020-01-06 | Discharge: 2020-01-06 | Disposition: A | Discharge status: Home / Self Care | Payer: BC Managed Care – PPO | Source: Ambulatory Visit | Attending: Radiation Oncology | Admitting: Radiation Oncology

## 2020-01-06 VITALS — BP 139/75 | HR 92 | Temp 98.7°F | Resp 18

## 2020-01-06 DIAGNOSIS — Z510 Encounter for antineoplastic radiation therapy: Secondary | ICD-10-CM | POA: Diagnosis not present

## 2020-01-06 DIAGNOSIS — C539 Malignant neoplasm of cervix uteri, unspecified: Secondary | ICD-10-CM

## 2020-01-06 DIAGNOSIS — Z5111 Encounter for antineoplastic chemotherapy: Secondary | ICD-10-CM | POA: Diagnosis not present

## 2020-01-06 MED ORDER — PALONOSETRON HCL INJECTION 0.25 MG/5ML
0.2500 mg | Freq: Once | INTRAVENOUS | Status: AC
  Administered 2020-01-06: 0.25 mg via INTRAVENOUS

## 2020-01-06 MED ORDER — SODIUM CHLORIDE 0.9 % IV SOLN
150.0000 mg | Freq: Once | INTRAVENOUS | Status: AC
  Administered 2020-01-06: 150 mg via INTRAVENOUS
  Filled 2020-01-06: qty 150

## 2020-01-06 MED ORDER — PALONOSETRON HCL INJECTION 0.25 MG/5ML
INTRAVENOUS | Status: AC
  Filled 2020-01-06: qty 5

## 2020-01-06 MED ORDER — POTASSIUM CHLORIDE 2 MEQ/ML IV SOLN
Freq: Once | INTRAVENOUS | Status: AC
  Filled 2020-01-06: qty 10

## 2020-01-06 MED ORDER — SODIUM CHLORIDE 0.9 % IV SOLN
10.0000 mg | Freq: Once | INTRAVENOUS | Status: AC
  Administered 2020-01-06: 10 mg via INTRAVENOUS
  Filled 2020-01-06: qty 10

## 2020-01-06 MED ORDER — SODIUM CHLORIDE 0.9 % IV SOLN
Freq: Once | INTRAVENOUS | Status: AC
  Filled 2020-01-06: qty 250

## 2020-01-06 MED ORDER — SODIUM CHLORIDE 0.9% FLUSH
10.0000 mL | INTRAVENOUS | Status: DC | PRN
  Administered 2020-01-06: 10 mL
  Filled 2020-01-06: qty 10

## 2020-01-06 MED ORDER — HEPARIN SOD (PORK) LOCK FLUSH 100 UNIT/ML IV SOLN
500.0000 [IU] | Freq: Once | INTRAVENOUS | Status: AC | PRN
  Administered 2020-01-06: 500 [IU]
  Filled 2020-01-06: qty 5

## 2020-01-06 MED ORDER — SODIUM CHLORIDE 0.9 % IV SOLN
40.0000 mg/m2 | Freq: Once | INTRAVENOUS | Status: AC
  Administered 2020-01-06: 74 mg via INTRAVENOUS
  Filled 2020-01-06: qty 74

## 2020-01-06 NOTE — Telephone Encounter (Signed)
-----   Message from [place], MD sent at 01/06/2020  5:19 AM EDT ----- I have sent an email with September dates including the first week of the month. JE ----- Message ----- From: Lawana Chambers Sent: 01/05/2020  11:49 AM EDT To: Garner Nash, DO, Chi Rodman Pickle, MD  Good morning Dr. Valeta Harms,   Dr. Loanne Drilling has no available appointments in August, and I don't have September dates yet for her. Would you like to wait until she has September dates available or schedule with someone else?   Thanks,  Patrice  ----- Message ----- From: Garner Nash, DO Sent: 12/29/2019   2:36 PM EDT To: Lawana Chambers, Chi Rodman Pickle, MD   Patrice,   Would you mind setting her up with an appt to establish care with Dr. Loanne Drilling for a new dx of sarcoidosis. Next available is fine   Thanks  Brad   ----- Message ----- From: Buel Ream, [facility] [place] Sent: 12/16/2019   5:36 PM EDT To: Garner Nash, DO

## 2020-01-06 NOTE — Telephone Encounter (Signed)
Called and left message on pt vm to call back to schedule new patient appt with Dr. Loanne Drilling for Sarcoidosis in September. -pr

## 2020-01-06 NOTE — Patient Instructions (Signed)
[facility] Discharge Instructions for Patients Receiving Chemotherapy  Today you received the following chemotherapy agents Cisplatin  To help prevent nausea and vomiting after your treatment, we encourage you to take your nausea medication as directed  If you develop nausea and vomiting that is not controlled by your nausea medication, call the clinic.   BELOW ARE SYMPTOMS THAT SHOULD BE REPORTED IMMEDIATELY:  *FEVER GREATER THAN 100.5 F  *CHILLS WITH OR WITHOUT FEVER  NAUSEA AND VOMITING THAT IS NOT CONTROLLED WITH YOUR NAUSEA MEDICATION  *UNUSUAL SHORTNESS OF BREATH  *UNUSUAL BRUISING OR BLEEDING  TENDERNESS IN MOUTH AND THROAT WITH OR WITHOUT PRESENCE OF ULCERS  *URINARY PROBLEMS  *BOWEL PROBLEMS  UNUSUAL RASH Items with * indicate a potential emergency and should be followed up as soon as possible.  Feel free to call the clinic should you have any questions or concerns. The clinic phone number is [phone].  Please show the CHEMO ALERT CARD at check-in to the Emergency Department and triage nurse.   

## 2020-01-09 ENCOUNTER — Ambulatory Visit
Admission: RE | Admit: 2020-01-09 | Discharge: 2020-01-09 | Disposition: A | Discharge status: Home / Self Care | Payer: BC Managed Care – PPO | Source: Ambulatory Visit | Attending: Radiation Oncology | Admitting: Radiation Oncology

## 2020-01-09 ENCOUNTER — Other Ambulatory Visit: Payer: Self-pay

## 2020-01-09 DIAGNOSIS — Z510 Encounter for antineoplastic radiation therapy: Secondary | ICD-10-CM | POA: Diagnosis not present

## 2020-01-09 NOTE — Telephone Encounter (Signed)
Left VM for patient to call office regarding September appointment.

## 2020-01-10 ENCOUNTER — Ambulatory Visit
Admission: RE | Admit: 2020-01-10 | Discharge: 2020-01-10 | Disposition: A | Discharge status: Home / Self Care | Payer: BC Managed Care – PPO | Source: Ambulatory Visit | Attending: Radiation Oncology | Admitting: Radiation Oncology

## 2020-01-10 ENCOUNTER — Inpatient Hospital Stay: Payer: BC Managed Care – PPO

## 2020-01-10 ENCOUNTER — Other Ambulatory Visit: Payer: Self-pay

## 2020-01-10 DIAGNOSIS — Z5111 Encounter for antineoplastic chemotherapy: Secondary | ICD-10-CM | POA: Diagnosis not present

## 2020-01-10 DIAGNOSIS — C539 Malignant neoplasm of cervix uteri, unspecified: Secondary | ICD-10-CM

## 2020-01-10 DIAGNOSIS — Z510 Encounter for antineoplastic radiation therapy: Secondary | ICD-10-CM | POA: Diagnosis not present

## 2020-01-10 LAB — CBC WITH DIFFERENTIAL (CANCER CENTER ONLY)
Abs Immature Granulocytes: 0.01 10*3/uL (ref 0.00–0.07)
Basophils Absolute: 0 10*3/uL (ref 0.0–0.1)
Basophils Relative: 0 %
Eosinophils Absolute: 0.2 10*3/uL (ref 0.0–0.5)
Eosinophils Relative: 4 %
HCT: 33.6 % — ABNORMAL LOW (ref 36.0–46.0)
Hemoglobin: 10.1 g/dL — ABNORMAL LOW (ref 12.0–15.0)
Immature Granulocytes: 0 %
Lymphocytes Relative: 8 %
Lymphs Abs: 0.4 10*3/uL — ABNORMAL LOW (ref 0.7–4.0)
MCH: 23.5 pg — ABNORMAL LOW (ref 26.0–34.0)
MCHC: 30.1 g/dL (ref 30.0–36.0)
MCV: 78.1 fL — ABNORMAL LOW (ref 80.0–100.0)
Monocytes Absolute: 0.3 10*3/uL (ref 0.1–1.0)
Monocytes Relative: 6 %
Neutro Abs: 4.3 10*3/uL (ref 1.7–7.7)
Neutrophils Relative %: 82 %
Platelet Count: 227 10*3/uL (ref 150–400)
RBC: 4.3 MIL/uL (ref 3.87–5.11)
RDW: 15.2 % (ref 11.5–15.5)
WBC Count: 5.3 10*3/uL (ref 4.0–10.5)
nRBC: 0 % (ref 0.0–0.2)

## 2020-01-10 LAB — BASIC METABOLIC PANEL - CANCER CENTER ONLY
Anion gap: 6 (ref 5–15)
BUN: 6 mg/dL (ref 6–20)
CO2: 27 mmol/L (ref 22–32)
Calcium: 9.4 mg/dL (ref 8.9–10.3)
Chloride: 108 mmol/L (ref 98–111)
Creatinine: 0.83 mg/dL (ref 0.44–1.00)
GFR, Est AFR Am: >60 mL/min (ref >60)
GFR, Estimated: >60 mL/min (ref >60)
Glucose, Bld: 95 mg/dL (ref 70–99)
Potassium: 4.1 mmol/L (ref 3.5–5.1)
Sodium: 141 mmol/L (ref 135–145)

## 2020-01-10 LAB — MAGNESIUM: Magnesium: 1.8 mg/dL (ref 1.7–2.4)

## 2020-01-11 ENCOUNTER — Other Ambulatory Visit: Payer: Self-pay

## 2020-01-11 ENCOUNTER — Ambulatory Visit
Admission: RE | Admit: 2020-01-11 | Discharge: 2020-01-11 | Disposition: A | Discharge status: Home / Self Care | Payer: BC Managed Care – PPO | Source: Ambulatory Visit | Attending: Radiation Oncology | Admitting: Radiation Oncology

## 2020-01-11 DIAGNOSIS — Z510 Encounter for antineoplastic radiation therapy: Secondary | ICD-10-CM | POA: Diagnosis not present

## 2020-01-11 NOTE — Telephone Encounter (Signed)
Tried calling again and still no answer- LMTCB and will close per protocol ?

## 2020-01-12 ENCOUNTER — Ambulatory Visit (HOSPITAL_COMMUNITY): Payer: Medicaid Other

## 2020-01-12 ENCOUNTER — Inpatient Hospital Stay (HOSPITAL_BASED_OUTPATIENT_CLINIC_OR_DEPARTMENT_OTHER): Payer: BC Managed Care – PPO | Admitting: Hematology and Oncology

## 2020-01-12 ENCOUNTER — Encounter: Payer: Self-pay | Admitting: Hematology and Oncology

## 2020-01-12 ENCOUNTER — Ambulatory Visit
Admission: RE | Admit: 2020-01-12 | Discharge: 2020-01-12 | Disposition: A | Discharge status: Home / Self Care | Payer: BC Managed Care – PPO | Source: Ambulatory Visit | Attending: Radiation Oncology | Admitting: Radiation Oncology

## 2020-01-12 ENCOUNTER — Other Ambulatory Visit: Payer: Self-pay

## 2020-01-12 DIAGNOSIS — R197 Diarrhea, unspecified: Secondary | ICD-10-CM | POA: Diagnosis not present

## 2020-01-12 DIAGNOSIS — R102 Pelvic and perineal pain: Secondary | ICD-10-CM

## 2020-01-12 DIAGNOSIS — D638 Anemia in other chronic diseases classified elsewhere: Secondary | ICD-10-CM

## 2020-01-12 DIAGNOSIS — C539 Malignant neoplasm of cervix uteri, unspecified: Secondary | ICD-10-CM

## 2020-01-12 DIAGNOSIS — Z510 Encounter for antineoplastic radiation therapy: Secondary | ICD-10-CM | POA: Diagnosis not present

## 2020-01-12 MED ORDER — HYDROCODONE-ACETAMINOPHEN 5-325 MG PO TABS: 1.0000 | ORAL_TABLET | Freq: Four times a day (QID) | ORAL | 0 refills | Status: DC | PRN

## 2020-01-12 NOTE — Assessment & Plan Note (Signed)
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

## 2020-01-12 NOTE — Assessment & Plan Note (Signed)
She has multifactorial anemia, combination of anemia of chronic disease and iron deficiency I recommend a short course oral iron supplement Observe for now

## 2020-01-12 NOTE — Assessment & Plan Note (Signed)
So far, she tolerated treatment well except for dysuria and pelvic pain, consistent with radiation cystitis She has stable anemia and diarrhea We will proceed with treatment as scheduled I will see her back next week for further follow-up

## 2020-01-12 NOTE — Assessment & Plan Note (Signed)
Likely due to side effects of treatment I recommend Imodium as needed

## 2020-01-12 NOTE — Progress Notes (Signed)
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[facility]) So far, she tolerated treatment well except for dysuria and pelvic pain, consistent with radiation cystitis She has stable anemia and diarrhea We will proceed with treatment as scheduled I will see her back next week for further follow-up  Anemia due to chronic illness She has multifactorial anemia, combination of anemia of chronic disease and iron deficiency I recommend a short course oral iron supplement Observe for now  Diarrhea Likely due to side effects of treatment I recommend Imodium as needed  Pelvic pain 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   All questions were answered. The patient knows to call the clinic with any problems, questions or concerns. The total time spent in the appointment was 20 minutes encounter with patients including review of chart and various tests results, discussions about plan of care and coordination of care plan   Heath Lark, MD 01/12/2020 11:40 AM  INTERVAL HISTORY: Please see below for problem oriented charting. She returns for chemotherapy and follow-up She is doing well She is complaining of diarrhea and persistent pelvic pain Denies nausea, neuropathy or hearing difficulties while on treatment No recent fever or chills Vaginal bleeding has stopped  SUMMARY OF ONCOLOGIC HISTORY: Oncology History  Squamous cell carcinoma of cervix ([place])  10/21/2019 Initial Diagnosis   The patient presented to the emergency department on 5/25 and again on 6/3  for abdominal pain and postmenopausal bleeding that started 5/21.  On 6/10 she underwent Pap smear.  Last Pap smear had been 30 years prior and normal per patient.     11/21/2019 Pathology Results   A. CERVIX, BIOPSY:  -  Squamous cell carcinoma    11/29/2019 PET scan   1. Highly hypermetabolic cervical mass, with fluid distended endometrial cavity. Maximum SUV of the cervical mass is 25.8. 2. Left adnexal lesion versus more likely fundal fibroid, maximum SUV 6.0. Surveillance suggested. 3. There is low-grade activity in several abdominal lymph nodes including a borderline enlarged peripancreatic lymph node. However, there is striking activity in numerous enlarged thoracic lymph nodes, some of which have associated calcifications. It would be unusual to have this degree of thoracic adenopathy from cervical cancer without having a commensurate level of abdominopelvic adenopathy. This along with the calcifications of the lymph nodes raises the possibility of active granulomatous process (such as sarcoidosis) as a potential cause for the striking thoracic adenopathy. Concomitant lymphoma might also have a similar appearance. Sampling of thoracic lymph nodes may be helpful in differentiating metastatic disease other entities such as sarcoid or Lymphoma.  4. Mild pleural/subpleural nodularity in the lungs, merit surveillance. 5. Cholelithiasis.   11/30/2019 Cancer Staging   Staging form: Cervix Uteri, AJCC Version 9 - Clinical stage from 11/30/2019: FIGO Stage IIB (cT2b, cN0, cM0) - Signed by Heath Lark, MD on 11/30/2019   12/02/2019 Imaging   Ct abdomen and pelvis 1. Cervical soft tissue mass as described in keeping with known malignancy. 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 2. Mildly rounded subcentimeter aortocaval and right pelvic sidewall lymph nodes.  3. A 5.3 x 3.9 cm left fundal exophytic soft tissue suboptimally characterized but possibly a fibroid. 4.  Cholelithiasis. 5. No bowel obstruction. Normal appendix. 6. Small bilateral lung base subpleural nodules similar to prior PET CT. Clinical correlation and follow-up recommended.   12/22/2019 Procedure   Successful placement of a right internal jugular approach power injectable Port-A-Cath. The catheter is ready for immediate use.     REVIEW OF SYSTEMS:   Constitutional: Denies fevers, chills or abnormal weight loss Eyes: Denies blurriness of vision Ears, nose, mouth, throat, and face: Denies mucositis or sore throat Respiratory: Denies cough, dyspnea or wheezes Cardiovascular: Denies palpitation, chest discomfort or lower extremity swelling Skin: Denies abnormal skin rashes Lymphatics: Denies new lymphadenopathy or easy bruising Neurological:Denies numbness, tingling or new weaknesses Behavioral/Psych: Mood is stable, no new changes  All other systems were reviewed with the patient and are negative.  I have reviewed the past medical history, past surgical history, social history and family history with the patient and they are unchanged from previous note.  ALLERGIES:  is allergic to nsaids.  MEDICATIONS:  Current Outpatient Medications  Medication Sig Dispense Refill   acetaminophen (TYLENOL) 500 MG tablet Take 1,000 mg by mouth every 6 (six) hours as needed for moderate pain.     amLODipine (NORVASC) 5 MG tablet Take 1 tablet (5 mg total) by mouth daily. 90 tablet 1   HYDROcodone-acetaminophen (NORCO/VICODIN) 5-325 MG tablet Take 1 tablet by mouth every 6 (six) hours as needed. 60 tablet 0   lidocaine-prilocaine (EMLA) cream Apply to affected area once 30 g 3   ondansetron (ZOFRAN ODT) 4 MG disintegrating tablet Take 1 tablet (4 mg total) by mouth every 8 (eight) hours as needed for nausea or vomiting. 20 tablet 0   ondansetron (ZOFRAN) 8 MG tablet Take 1 tablet (8 mg total) by mouth every 8 (eight) hours as needed. Start on the third day after chemotherapy. 30 tablet 1    phenazopyridine (PYRIDIUM) 200 MG tablet Take 1 tablet (200 mg total) by mouth 3 (three) times daily. 6 tablet 0   potassium chloride SA (KLOR-CON) 20 MEQ tablet Take 1 tablet (20 mEq total) by mouth daily. 10 tablet 0   prochlorperazine (COMPAZINE) 10 MG tablet Take 1 tablet (10 mg total) by mouth every 6 (six) hours as needed (Nausea or vomiting). 30 tablet 1   No current facility-administered medications for this visit.    PHYSICAL EXAMINATION: ECOG PERFORMANCE STATUS: 1 - Symptomatic but completely ambulatory  Vitals:   01/12/20 1017  BP: 138/82  Pulse: 85  Resp: 18  Temp: (!) 97.5 F (36.4 C)  SpO2: 100%   Filed Weights   01/12/20 1017  Weight: 230 lb 3.2 oz (104.4 kg)    GENERAL:alert, no distress and comfortable SKIN: skin color, texture, turgor are normal, no rashes or significant lesions EYES: normal, Conjunctiva are pink and non-injected, sclera clear OROPHARYNX:no exudate, no erythema and lips, buccal mucosa, and tongue normal  NECK: supple, thyroid normal size, non-tender, without nodularity LYMPH:  no palpable lymphadenopathy in the cervical, axillary or inguinal LUNGS: clear to auscultation and percussion with normal breathing effort HEART: regular rate & rhythm and no murmurs and no lower extremity edema ABDOMEN:abdomen soft, non-tender and normal bowel sounds Musculoskeletal:no cyanosis of digits and no clubbing  NEURO: alert & oriented x 3 with fluent speech, no focal motor/sensory deficits  LABORATORY DATA:  I have reviewed the data as listed    Component Value Date/Time   NA 141  01/10/2020 0956   K 4.1 01/10/2020 0956   CL 108 01/10/2020 0956   CO2 27 01/10/2020 0956   GLUCOSE 95 01/10/2020 0956   BUN 6 01/10/2020 0956   CREATININE 0.83 01/10/2020 0956   CALCIUM 9.4 01/10/2020 0956   PROT 7.1 01/01/2020 1113   ALBUMIN 3.2 (L) 01/01/2020 1113   AST 12 (L) 01/01/2020 1113   AST 13 (L) 12/29/2019 0746   ALT 10 01/01/2020 1113   ALT 8 12/29/2019  0746   ALKPHOS 61 01/01/2020 1113   BILITOT 0.6 01/01/2020 1113   BILITOT 0.3 12/29/2019 0746   GFRNONAA >60 01/10/2020 0956   GFRAA >60 01/10/2020 0956    No results found for: SPEP, UPEP  Lab Results  Component Value Date   WBC 5.3 01/10/2020   NEUTROABS 4.3 01/10/2020   HGB 10.1 (L) 01/10/2020   HCT 33.6 (L) 01/10/2020   MCV 78.1 (L) 01/10/2020   PLT 227 01/10/2020      Chemistry      Component Value Date/Time   NA 141 01/10/2020 0956   K 4.1 01/10/2020 0956   CL 108 01/10/2020 0956   CO2 27 01/10/2020 0956   BUN 6 01/10/2020 0956   CREATININE 0.83 01/10/2020 0956      Component Value Date/Time   CALCIUM 9.4 01/10/2020 0956   ALKPHOS 61 01/01/2020 1113   AST 12 (L) 01/01/2020 1113   AST 13 (L) 12/29/2019 0746   ALT 10 01/01/2020 1113   ALT 8 12/29/2019 0746   BILITOT 0.6 01/01/2020 1113   BILITOT 0.3 12/29/2019 0746

## 2020-01-13 ENCOUNTER — Inpatient Hospital Stay: Payer: BC Managed Care – PPO

## 2020-01-13 ENCOUNTER — Ambulatory Visit
Admission: RE | Admit: 2020-01-13 | Discharge: 2020-01-13 | Disposition: A | Discharge status: Home / Self Care | Payer: BC Managed Care – PPO | Source: Ambulatory Visit | Attending: Radiation Oncology | Admitting: Radiation Oncology

## 2020-01-13 ENCOUNTER — Other Ambulatory Visit: Payer: Self-pay

## 2020-01-13 VITALS — BP 139/76 | HR 69 | Temp 98.8°F | Resp 18

## 2020-01-13 DIAGNOSIS — Z510 Encounter for antineoplastic radiation therapy: Secondary | ICD-10-CM | POA: Diagnosis not present

## 2020-01-13 DIAGNOSIS — Z5111 Encounter for antineoplastic chemotherapy: Secondary | ICD-10-CM | POA: Diagnosis not present

## 2020-01-13 DIAGNOSIS — C539 Malignant neoplasm of cervix uteri, unspecified: Secondary | ICD-10-CM

## 2020-01-13 MED ORDER — SODIUM CHLORIDE 0.9 % IV SOLN
150.0000 mg | Freq: Once | INTRAVENOUS | Status: AC
  Administered 2020-01-13: 150 mg via INTRAVENOUS
  Filled 2020-01-13: qty 150

## 2020-01-13 MED ORDER — PALONOSETRON HCL INJECTION 0.25 MG/5ML
INTRAVENOUS | Status: AC
  Filled 2020-01-13: qty 5

## 2020-01-13 MED ORDER — POTASSIUM CHLORIDE 2 MEQ/ML IV SOLN
Freq: Once | INTRAVENOUS | Status: AC
  Filled 2020-01-13: qty 10

## 2020-01-13 MED ORDER — SODIUM CHLORIDE 0.9 % IV SOLN
10.0000 mg | Freq: Once | INTRAVENOUS | Status: AC
  Administered 2020-01-13: 10 mg via INTRAVENOUS
  Filled 2020-01-13: qty 10

## 2020-01-13 MED ORDER — SODIUM CHLORIDE 0.9 % IV SOLN
40.0000 mg/m2 | Freq: Once | INTRAVENOUS | Status: AC
  Administered 2020-01-13: 74 mg via INTRAVENOUS
  Filled 2020-01-13: qty 74

## 2020-01-13 MED ORDER — HEPARIN SOD (PORK) LOCK FLUSH 100 UNIT/ML IV SOLN
500.0000 [IU] | Freq: Once | INTRAVENOUS | Status: AC | PRN
  Administered 2020-01-13: 500 [IU]
  Filled 2020-01-13: qty 5

## 2020-01-13 MED ORDER — PALONOSETRON HCL INJECTION 0.25 MG/5ML
0.2500 mg | Freq: Once | INTRAVENOUS | Status: AC
  Administered 2020-01-13: 0.25 mg via INTRAVENOUS

## 2020-01-13 MED ORDER — SODIUM CHLORIDE 0.9% FLUSH
10.0000 mL | INTRAVENOUS | Status: DC | PRN
  Administered 2020-01-13: 10 mL
  Filled 2020-01-13: qty 10

## 2020-01-13 MED ORDER — SODIUM CHLORIDE 0.9 % IV SOLN
Freq: Once | INTRAVENOUS | Status: AC
  Filled 2020-01-13: qty 250

## 2020-01-13 NOTE — Patient Instructions (Signed)
[facility] Discharge Instructions for Patients Receiving Chemotherapy  Today you received the following chemotherapy agents Cisplatin (PLATINOL).  To help prevent nausea and vomiting after your treatment, we encourage you to take your nausea medication as prescribed.   If you develop nausea and vomiting that is not controlled by your nausea medication, call the clinic.   BELOW ARE SYMPTOMS THAT SHOULD BE REPORTED IMMEDIATELY:  *FEVER GREATER THAN 100.5 F  *CHILLS WITH OR WITHOUT FEVER  NAUSEA AND VOMITING THAT IS NOT CONTROLLED WITH YOUR NAUSEA MEDICATION  *UNUSUAL SHORTNESS OF BREATH  *UNUSUAL BRUISING OR BLEEDING  TENDERNESS IN MOUTH AND THROAT WITH OR WITHOUT PRESENCE OF ULCERS  *URINARY PROBLEMS  *BOWEL PROBLEMS  UNUSUAL RASH Items with * indicate a potential emergency and should be followed up as soon as possible.  Feel free to call the clinic should you have any questions or concerns. The clinic phone number is [phone].  Please show the CHEMO ALERT CARD at check-in to the Emergency Department and triage nurse.   

## 2020-01-16 ENCOUNTER — Ambulatory Visit
Admission: RE | Admit: 2020-01-16 | Discharge: 2020-01-16 | Disposition: A | Discharge status: Home / Self Care | Payer: BC Managed Care – PPO | Source: Ambulatory Visit | Attending: Radiation Oncology | Admitting: Radiation Oncology

## 2020-01-16 ENCOUNTER — Other Ambulatory Visit: Payer: Self-pay

## 2020-01-16 DIAGNOSIS — Z510 Encounter for antineoplastic radiation therapy: Secondary | ICD-10-CM | POA: Diagnosis not present

## 2020-01-17 ENCOUNTER — Inpatient Hospital Stay: Payer: BC Managed Care – PPO

## 2020-01-17 ENCOUNTER — Ambulatory Visit
Admission: RE | Admit: 2020-01-17 | Discharge: 2020-01-17 | Disposition: A | Discharge status: Home / Self Care | Payer: BC Managed Care – PPO | Source: Ambulatory Visit | Attending: Radiation Oncology | Admitting: Radiation Oncology

## 2020-01-17 ENCOUNTER — Other Ambulatory Visit: Payer: Self-pay

## 2020-01-17 DIAGNOSIS — Z5111 Encounter for antineoplastic chemotherapy: Secondary | ICD-10-CM | POA: Diagnosis not present

## 2020-01-17 DIAGNOSIS — C539 Malignant neoplasm of cervix uteri, unspecified: Secondary | ICD-10-CM

## 2020-01-17 DIAGNOSIS — Z510 Encounter for antineoplastic radiation therapy: Secondary | ICD-10-CM | POA: Diagnosis not present

## 2020-01-17 LAB — CBC WITH DIFFERENTIAL (CANCER CENTER ONLY)
Abs Immature Granulocytes: 0.01 10*3/uL (ref 0.00–0.07)
Basophils Absolute: 0 10*3/uL (ref 0.0–0.1)
Basophils Relative: 1 %
Eosinophils Absolute: 0.2 10*3/uL (ref 0.0–0.5)
Eosinophils Relative: 6 %
HCT: 31.8 % — ABNORMAL LOW (ref 36.0–46.0)
Hemoglobin: 9.6 g/dL — ABNORMAL LOW (ref 12.0–15.0)
Immature Granulocytes: 0 %
Lymphocytes Relative: 7 %
Lymphs Abs: 0.3 10*3/uL — ABNORMAL LOW (ref 0.7–4.0)
MCH: 23.1 pg — ABNORMAL LOW (ref 26.0–34.0)
MCHC: 30.2 g/dL (ref 30.0–36.0)
MCV: 76.6 fL — ABNORMAL LOW (ref 80.0–100.0)
Monocytes Absolute: 0.3 10*3/uL (ref 0.1–1.0)
Monocytes Relative: 6 %
Neutro Abs: 3.4 10*3/uL (ref 1.7–7.7)
Neutrophils Relative %: 80 %
Platelet Count: 177 10*3/uL (ref 150–400)
RBC: 4.15 MIL/uL (ref 3.87–5.11)
RDW: 16 % — ABNORMAL HIGH (ref 11.5–15.5)
WBC Count: 4.2 10*3/uL (ref 4.0–10.5)
nRBC: 0 % (ref 0.0–0.2)

## 2020-01-17 LAB — FUNGUS CULTURE WITH STAIN

## 2020-01-17 LAB — BASIC METABOLIC PANEL - CANCER CENTER ONLY
Anion gap: 9 (ref 5–15)
BUN: 5 mg/dL — ABNORMAL LOW (ref 6–20)
CO2: 25 mmol/L (ref 22–32)
Calcium: 9.3 mg/dL (ref 8.9–10.3)
Chloride: 104 mmol/L (ref 98–111)
Creatinine: 0.75 mg/dL (ref 0.44–1.00)
GFR, Est AFR Am: >60 mL/min (ref >60)
GFR, Estimated: >60 mL/min (ref >60)
Glucose, Bld: 100 mg/dL — ABNORMAL HIGH (ref 70–99)
Potassium: 3.6 mmol/L (ref 3.5–5.1)
Sodium: 138 mmol/L (ref 135–145)

## 2020-01-17 LAB — FUNGUS CULTURE RESULT

## 2020-01-17 LAB — FUNGAL ORGANISM REFLEX

## 2020-01-17 LAB — MAGNESIUM: Magnesium: 1.7 mg/dL (ref 1.7–2.4)

## 2020-01-17 MED ORDER — SODIUM CHLORIDE 0.9% FLUSH
10.0000 mL | Freq: Once | INTRAVENOUS | Status: AC
  Administered 2020-01-17: 10 mL
  Filled 2020-01-17: qty 10

## 2020-01-17 MED ORDER — HEPARIN SOD (PORK) LOCK FLUSH 100 UNIT/ML IV SOLN
500.0000 [IU] | Freq: Once | INTRAVENOUS | Status: AC
  Administered 2020-01-17: 500 [IU]
  Filled 2020-01-17: qty 5

## 2020-01-18 ENCOUNTER — Ambulatory Visit
Admission: RE | Admit: 2020-01-18 | Discharge: 2020-01-18 | Disposition: A | Discharge status: Home / Self Care | Payer: BC Managed Care – PPO | Source: Ambulatory Visit | Attending: Radiation Oncology | Admitting: Radiation Oncology

## 2020-01-18 ENCOUNTER — Other Ambulatory Visit: Payer: Self-pay

## 2020-01-18 DIAGNOSIS — Z510 Encounter for antineoplastic radiation therapy: Secondary | ICD-10-CM | POA: Diagnosis not present

## 2020-01-19 ENCOUNTER — Encounter: Payer: Self-pay | Admitting: Hematology and Oncology

## 2020-01-19 ENCOUNTER — Other Ambulatory Visit: Payer: Self-pay

## 2020-01-19 ENCOUNTER — Ambulatory Visit
Admission: RE | Admit: 2020-01-19 | Discharge: 2020-01-19 | Disposition: A | Discharge status: Home / Self Care | Payer: BC Managed Care – PPO | Source: Ambulatory Visit | Attending: Radiation Oncology | Admitting: Radiation Oncology

## 2020-01-19 ENCOUNTER — Inpatient Hospital Stay (HOSPITAL_BASED_OUTPATIENT_CLINIC_OR_DEPARTMENT_OTHER): Payer: BC Managed Care – PPO | Admitting: Hematology and Oncology

## 2020-01-19 DIAGNOSIS — R197 Diarrhea, unspecified: Secondary | ICD-10-CM

## 2020-01-19 DIAGNOSIS — C539 Malignant neoplasm of cervix uteri, unspecified: Secondary | ICD-10-CM

## 2020-01-19 DIAGNOSIS — D638 Anemia in other chronic diseases classified elsewhere: Secondary | ICD-10-CM

## 2020-01-19 DIAGNOSIS — Z510 Encounter for antineoplastic radiation therapy: Secondary | ICD-10-CM | POA: Diagnosis not present

## 2020-01-19 NOTE — Progress Notes (Signed)
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   All questions were answered. The patient knows to call the clinic with any problems, questions or concerns. The total time spent in the appointment was 20 minutes encounter with patients including review of chart and various tests results, discussions about plan of care and coordination of care plan   Heath Lark, MD 01/19/2020 12:00 PM  INTERVAL HISTORY: Please see below for problem oriented charting. She returns for further follow-up She tolerated recent treatment well Denies vaginal bleeding She had very mild diarrhea, responded well to Imodium Denies peripheral neuropathy No nausea or vomiting  SUMMARY OF ONCOLOGIC HISTORY: Oncology History  Squamous cell carcinoma of cervix ([place])  10/21/2019 Initial Diagnosis   The patient presented to the emergency department on 5/25 and again on 6/3 for abdominal pain and postmenopausal bleeding that started 5/21.  On 6/10 she underwent Pap smear.  Last Pap smear had been 30 years prior and normal per patient.     11/21/2019 Pathology Results   A. CERVIX, BIOPSY:  -  Squamous cell carcinoma    11/29/2019 PET scan   1. Highly hypermetabolic cervical mass, with fluid distended endometrial  cavity. Maximum SUV of the cervical mass is 25.8. 2. Left adnexal lesion versus more likely fundal fibroid, maximum SUV 6.0. Surveillance suggested. 3. There is low-grade activity in several abdominal lymph nodes including a borderline enlarged peripancreatic lymph node. However, there is striking activity in numerous enlarged thoracic lymph nodes, some of which have associated calcifications. It would be unusual to have this degree of thoracic adenopathy from cervical cancer without having a commensurate level of abdominopelvic adenopathy. This along with the calcifications of the lymph nodes raises the possibility of active granulomatous process (such as sarcoidosis) as a potential cause for the striking thoracic adenopathy. Concomitant lymphoma might also have a similar appearance. Sampling of thoracic lymph nodes may be helpful in differentiating metastatic disease other entities such as sarcoid or Lymphoma.  4. Mild pleural/subpleural nodularity in the lungs, merit surveillance. 5. Cholelithiasis.   11/30/2019 Cancer Staging   Staging form: Cervix Uteri, AJCC Version 9 - Clinical stage from 11/30/2019: FIGO Stage IIB (cT2b, cN0, cM0) - Signed by Heath Lark, MD on 11/30/2019   12/02/2019 Imaging   Ct abdomen and pelvis 1. Cervical soft tissue mass as described in keeping with known malignancy. 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 2. Mildly rounded subcentimeter aortocaval and right pelvic sidewall lymph nodes. 3. A 5.3 x 3.9 cm left fundal exophytic soft tissue suboptimally characterized but possibly a fibroid. 4. Cholelithiasis. 5. No bowel obstruction. Normal appendix. 6. Small bilateral lung base subpleural nodules similar to prior PET CT. Clinical correlation and follow-up recommended.   12/22/2019 Procedure   Successful placement of a right internal jugular approach power  injectable Port-A-Cath. The catheter is ready for immediate use.     REVIEW OF  SYSTEMS:   Constitutional: Denies fevers, chills or abnormal weight loss Eyes: Denies blurriness of vision Ears, nose, mouth, throat, and face: Denies mucositis or sore throat Respiratory: Denies cough, dyspnea or wheezes Cardiovascular: Denies palpitation, chest discomfort or lower extremity swelling Skin: Denies abnormal skin rashes Lymphatics: Denies new lymphadenopathy or easy bruising Neurological:Denies numbness, tingling or new weaknesses Behavioral/Psych: Mood is stable, no new changes  All other systems were reviewed with the patient and are negative.  I have reviewed the past medical history, past surgical history, social history and family history with the patient and they are unchanged from previous note.  ALLERGIES:  is allergic to nsaids.  MEDICATIONS:  Current Outpatient Medications  Medication Sig Dispense Refill  . acetaminophen (TYLENOL) 500 MG tablet Take 1,000 mg by mouth every 6 (six) hours as needed for moderate pain.    Marland Kitchen amLODipine (NORVASC) 5 MG tablet Take 1 tablet (5 mg total) by mouth daily. 90 tablet 1  . HYDROcodone-acetaminophen (NORCO/VICODIN) 5-325 MG tablet Take 1 tablet by mouth every 6 (six) hours as needed. 60 tablet 0  . lidocaine-prilocaine (EMLA) cream Apply to affected area once 30 g 3  . ondansetron (ZOFRAN ODT) 4 MG disintegrating tablet Take 1 tablet (4 mg total) by mouth every 8 (eight) hours as needed for nausea or vomiting. 20 tablet 0  . ondansetron (ZOFRAN) 8 MG tablet Take 1 tablet (8 mg total) by mouth every 8 (eight) hours as needed. Start on the third day after chemotherapy. 30 tablet 1  . phenazopyridine (PYRIDIUM) 200 MG tablet Take 1 tablet (200 mg total) by mouth 3 (three) times daily. 6 tablet 0  . potassium chloride SA (KLOR-CON) 20 MEQ tablet Take 1 tablet (20 mEq total) by mouth daily. 10 tablet 0  . prochlorperazine (COMPAZINE) 10 MG tablet Take 1 tablet (10 mg total) by mouth every 6 (six) hours as needed (Nausea or vomiting).  30 tablet 1   No current facility-administered medications for this visit.    PHYSICAL EXAMINATION: ECOG PERFORMANCE STATUS: 1 - Symptomatic but completely ambulatory  Vitals:   01/19/20 1100  BP: (!) 144/77  Pulse: 87  Temp: 98.3 F (36.8 C)  SpO2: 100%   Filed Weights   01/19/20 1100  Weight: 229 lb 6.4 oz (104.1 kg)    GENERAL:alert, no distress and comfortable SKIN: skin color, texture, turgor are normal, no rashes or significant lesions EYES: normal, Conjunctiva are pink and non-injected, sclera clear OROPHARYNX:no exudate, no erythema and lips, buccal mucosa, and tongue normal  NECK: supple, thyroid normal size, non-tender, without nodularity LYMPH:  no palpable lymphadenopathy in the cervical, axillary or inguinal LUNGS: clear to auscultation and percussion with normal breathing effort HEART: regular rate & rhythm and no murmurs and no lower extremity edema ABDOMEN:abdomen soft, non-tender and normal bowel sounds Musculoskeletal:no cyanosis of digits and no clubbing  NEURO: alert & oriented x 3 with fluent speech, no focal motor/sensory deficits  LABORATORY DATA:  I have reviewed the data as listed    Component Value Date/Time   NA 138 01/17/2020 0952   K 3.6 01/17/2020 0952   CL 104 01/17/2020 0952   CO2 25 01/17/2020 0952   GLUCOSE 100 (H) 01/17/2020 0952   BUN 5 (L) 01/17/2020 0952   CREATININE 0.75 01/17/2020 0952   CALCIUM 9.3 01/17/2020 0952   PROT 7.1 01/01/2020 1113   ALBUMIN 3.2 (L) 01/01/2020 1113  AST 12 (L) 01/01/2020 1113   AST 13 (L) 12/29/2019 0746   ALT 10 01/01/2020 1113   ALT 8 12/29/2019 0746   ALKPHOS 61 01/01/2020 1113   BILITOT 0.6 01/01/2020 1113   BILITOT 0.3 12/29/2019 0746   GFRNONAA >60 01/17/2020 0952   GFRAA >60 01/17/2020 0952    No results found for: SPEP, UPEP  Lab Results  Component Value Date   WBC 4.2 01/17/2020   NEUTROABS 3.4 01/17/2020   HGB 9.6 (L) 01/17/2020   HCT 31.8 (L) 01/17/2020   MCV 76.6 (L)  01/17/2020   PLT 177 01/17/2020      Chemistry      Component Value Date/Time   NA 138 01/17/2020 0952   K 3.6 01/17/2020 0952   CL 104 01/17/2020 0952   CO2 25 01/17/2020 0952   BUN 5 (L) 01/17/2020 0952   CREATININE 0.75 01/17/2020 0952      Component Value Date/Time   CALCIUM 9.3 01/17/2020 0952   ALKPHOS 61 01/01/2020 1113   AST 12 (L) 01/01/2020 1113   AST 13 (L) 12/29/2019 0746   ALT 10 01/01/2020 1113   ALT 8 12/29/2019 0746   BILITOT 0.6 01/01/2020 1113   BILITOT 0.3 12/29/2019 0746

## 2020-01-19 NOTE — Assessment & Plan Note (Signed)
So far, she tolerated treatment well except for mild loose stools and anemia We will proceed with treatment as scheduled I will see her back next week for further follow-up

## 2020-01-19 NOTE — Assessment & Plan Note (Signed)
Likely due to side effects of treatment I recommend Imodium as needed

## 2020-01-19 NOTE — Assessment & Plan Note (Signed)
She has multifactorial anemia, combination of anemia of chronic disease and iron deficiency I recommend a short course oral iron supplement Observe for now

## 2020-01-20 ENCOUNTER — Other Ambulatory Visit: Payer: Self-pay

## 2020-01-20 ENCOUNTER — Ambulatory Visit
Admission: RE | Admit: 2020-01-20 | Discharge: 2020-01-20 | Disposition: A | Discharge status: Home / Self Care | Payer: BC Managed Care – PPO | Source: Ambulatory Visit | Attending: Radiation Oncology | Admitting: Radiation Oncology

## 2020-01-20 ENCOUNTER — Inpatient Hospital Stay: Payer: BC Managed Care – PPO

## 2020-01-20 VITALS — BP 153/78 | HR 83 | Temp 98.2°F | Resp 18

## 2020-01-20 DIAGNOSIS — C539 Malignant neoplasm of cervix uteri, unspecified: Secondary | ICD-10-CM

## 2020-01-20 DIAGNOSIS — Z510 Encounter for antineoplastic radiation therapy: Secondary | ICD-10-CM | POA: Diagnosis not present

## 2020-01-20 DIAGNOSIS — Z5111 Encounter for antineoplastic chemotherapy: Secondary | ICD-10-CM | POA: Diagnosis not present

## 2020-01-20 MED ORDER — SODIUM CHLORIDE 0.9 % IV SOLN
40.0000 mg/m2 | Freq: Once | INTRAVENOUS | Status: AC
  Administered 2020-01-20: 74 mg via INTRAVENOUS
  Filled 2020-01-20: qty 74

## 2020-01-20 MED ORDER — SODIUM CHLORIDE 0.9% FLUSH
10.0000 mL | INTRAVENOUS | Status: DC | PRN
  Administered 2020-01-20: 10 mL
  Filled 2020-01-20: qty 10

## 2020-01-20 MED ORDER — SODIUM CHLORIDE 0.9 % IV SOLN
10.0000 mg | Freq: Once | INTRAVENOUS | Status: AC
  Administered 2020-01-20: 10 mg via INTRAVENOUS
  Filled 2020-01-20: qty 10

## 2020-01-20 MED ORDER — PALONOSETRON HCL INJECTION 0.25 MG/5ML
INTRAVENOUS | Status: AC
  Filled 2020-01-20: qty 5

## 2020-01-20 MED ORDER — POTASSIUM CHLORIDE 2 MEQ/ML IV SOLN
Freq: Once | INTRAVENOUS | Status: AC
  Filled 2020-01-20: qty 10

## 2020-01-20 MED ORDER — SODIUM CHLORIDE 0.9 % IV SOLN
Freq: Once | INTRAVENOUS | Status: AC
  Filled 2020-01-20: qty 250

## 2020-01-20 MED ORDER — HEPARIN SOD (PORK) LOCK FLUSH 100 UNIT/ML IV SOLN
500.0000 [IU] | Freq: Once | INTRAVENOUS | Status: AC | PRN
  Administered 2020-01-20: 500 [IU]
  Filled 2020-01-20: qty 5

## 2020-01-20 MED ORDER — PALONOSETRON HCL INJECTION 0.25 MG/5ML
0.2500 mg | Freq: Once | INTRAVENOUS | Status: AC
  Administered 2020-01-20: 0.25 mg via INTRAVENOUS

## 2020-01-20 MED ORDER — SODIUM CHLORIDE 0.9 % IV SOLN
150.0000 mg | Freq: Once | INTRAVENOUS | Status: AC
  Administered 2020-01-20: 150 mg via INTRAVENOUS
  Filled 2020-01-20: qty 150

## 2020-01-20 NOTE — Patient Instructions (Signed)
[facility] Discharge Instructions for Patients Receiving Chemotherapy  Today you received the following chemotherapy agents Cisplatin (PLATINOL).  To help prevent nausea and vomiting after your treatment, we encourage you to take your nausea medication as prescribed.   If you develop nausea and vomiting that is not controlled by your nausea medication, call the clinic.   BELOW ARE SYMPTOMS THAT SHOULD BE REPORTED IMMEDIATELY:  *FEVER GREATER THAN 100.5 F  *CHILLS WITH OR WITHOUT FEVER  NAUSEA AND VOMITING THAT IS NOT CONTROLLED WITH YOUR NAUSEA MEDICATION  *UNUSUAL SHORTNESS OF BREATH  *UNUSUAL BRUISING OR BLEEDING  TENDERNESS IN MOUTH AND THROAT WITH OR WITHOUT PRESENCE OF ULCERS  *URINARY PROBLEMS  *BOWEL PROBLEMS  UNUSUAL RASH Items with * indicate a potential emergency and should be followed up as soon as possible.  Feel free to call the clinic should you have any questions or concerns. The clinic phone number is [phone].  Please show the CHEMO ALERT CARD at check-in to the Emergency Department and triage nurse.   

## 2020-01-23 ENCOUNTER — Institutional Professional Consult (permissible substitution): Payer: No Typology Code available for payment source | Admitting: Emergency Medicine

## 2020-01-23 ENCOUNTER — Ambulatory Visit: Payer: BC Managed Care – PPO

## 2020-01-24 ENCOUNTER — Other Ambulatory Visit: Payer: Self-pay

## 2020-01-24 ENCOUNTER — Inpatient Hospital Stay: Payer: BC Managed Care – PPO

## 2020-01-24 ENCOUNTER — Ambulatory Visit
Admission: RE | Admit: 2020-01-24 | Discharge: 2020-01-24 | Disposition: A | Discharge status: Home / Self Care | Payer: BC Managed Care – PPO | Source: Ambulatory Visit | Attending: Radiation Oncology | Admitting: Radiation Oncology

## 2020-01-24 DIAGNOSIS — C539 Malignant neoplasm of cervix uteri, unspecified: Secondary | ICD-10-CM

## 2020-01-24 DIAGNOSIS — Z510 Encounter for antineoplastic radiation therapy: Secondary | ICD-10-CM | POA: Diagnosis not present

## 2020-01-24 DIAGNOSIS — Z5111 Encounter for antineoplastic chemotherapy: Secondary | ICD-10-CM | POA: Diagnosis not present

## 2020-01-24 LAB — CBC WITH DIFFERENTIAL (CANCER CENTER ONLY)
Abs Immature Granulocytes: 0.01 10*3/uL (ref 0.00–0.07)
Basophils Absolute: 0 10*3/uL (ref 0.0–0.1)
Basophils Relative: 0 %
Eosinophils Absolute: 0.1 10*3/uL (ref 0.0–0.5)
Eosinophils Relative: 2 %
HCT: 32.4 % — ABNORMAL LOW (ref 36.0–46.0)
Hemoglobin: 10 g/dL — ABNORMAL LOW (ref 12.0–15.0)
Immature Granulocytes: 0 %
Lymphocytes Relative: 4 %
Lymphs Abs: 0.2 10*3/uL — ABNORMAL LOW (ref 0.7–4.0)
MCH: 23.8 pg — ABNORMAL LOW (ref 26.0–34.0)
MCHC: 30.9 g/dL (ref 30.0–36.0)
MCV: 77.1 fL — ABNORMAL LOW (ref 80.0–100.0)
Monocytes Absolute: 0.2 10*3/uL (ref 0.1–1.0)
Monocytes Relative: 5 %
Neutro Abs: 3.9 10*3/uL (ref 1.7–7.7)
Neutrophils Relative %: 89 %
Platelet Count: 170 10*3/uL (ref 150–400)
RBC: 4.2 MIL/uL (ref 3.87–5.11)
RDW: 17.1 % — ABNORMAL HIGH (ref 11.5–15.5)
WBC Count: 4.5 10*3/uL (ref 4.0–10.5)
nRBC: 0 % (ref 0.0–0.2)

## 2020-01-24 LAB — BASIC METABOLIC PANEL - CANCER CENTER ONLY
Anion gap: 7 (ref 5–15)
BUN: 7 mg/dL (ref 6–20)
CO2: 29 mmol/L (ref 22–32)
Calcium: 9.3 mg/dL (ref 8.9–10.3)
Chloride: 103 mmol/L (ref 98–111)
Creatinine: 0.83 mg/dL (ref 0.44–1.00)
GFR, Est AFR Am: >60 mL/min (ref >60)
GFR, Estimated: >60 mL/min (ref >60)
Glucose, Bld: 109 mg/dL — ABNORMAL HIGH (ref 70–99)
Potassium: 3.5 mmol/L (ref 3.5–5.1)
Sodium: 139 mmol/L (ref 135–145)

## 2020-01-24 LAB — MAGNESIUM: Magnesium: 1.5 mg/dL — ABNORMAL LOW (ref 1.7–2.4)

## 2020-01-25 ENCOUNTER — Ambulatory Visit
Admission: RE | Admit: 2020-01-25 | Discharge: 2020-01-25 | Disposition: A | Discharge status: Home / Self Care | Payer: BC Managed Care – PPO | Source: Ambulatory Visit | Attending: Radiation Oncology | Admitting: Radiation Oncology

## 2020-01-25 ENCOUNTER — Other Ambulatory Visit: Payer: Self-pay | Admitting: Radiation Oncology

## 2020-01-25 ENCOUNTER — Other Ambulatory Visit (HOSPITAL_COMMUNITY): Payer: Self-pay | Admitting: Radiation Oncology

## 2020-01-25 ENCOUNTER — Encounter: Payer: Self-pay | Admitting: Hematology and Oncology

## 2020-01-25 ENCOUNTER — Inpatient Hospital Stay (HOSPITAL_BASED_OUTPATIENT_CLINIC_OR_DEPARTMENT_OTHER): Payer: BC Managed Care – PPO | Admitting: Hematology and Oncology

## 2020-01-25 ENCOUNTER — Other Ambulatory Visit: Payer: Self-pay

## 2020-01-25 VITALS — BP 132/82 | HR 80 | Temp 97.0°F | Resp 18 | Wt 227.8 lb

## 2020-01-25 DIAGNOSIS — R197 Diarrhea, unspecified: Secondary | ICD-10-CM | POA: Diagnosis not present

## 2020-01-25 DIAGNOSIS — D638 Anemia in other chronic diseases classified elsewhere: Secondary | ICD-10-CM | POA: Diagnosis not present

## 2020-01-25 DIAGNOSIS — C539 Malignant neoplasm of cervix uteri, unspecified: Secondary | ICD-10-CM

## 2020-01-25 DIAGNOSIS — Z510 Encounter for antineoplastic radiation therapy: Secondary | ICD-10-CM | POA: Diagnosis not present

## 2020-01-25 MED ORDER — DIPHENOXYLATE-ATROPINE 2.5-0.025 MG PO TABS: 1.0000 | ORAL_TABLET | Freq: Four times a day (QID) | ORAL | 0 refills | Status: DC | PRN

## 2020-01-25 MED FILL — DIPHENOXYLATE-ATROPINE 2.5-: 2.5-0.025 | 15 days supply | Qty: 60 | Fill #0

## 2020-01-25 NOTE — Assessment & Plan Note (Signed)
This is likely due to side effects of treatment Despite taking Imodium on a regular basis, she still have frequent diarrhea I recommend the addition of Lomotil as needed Because of ongoing diarrhea, I do not plan to give her oral magnesium supplement

## 2020-01-25 NOTE — Progress Notes (Signed)
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[place]) Overall, she tolerated treatment well except for mild anemia, diarrhea and low magnesium We will proceed with treatment as scheduled She will complete chemotherapy this week I will see her again next week for further follow-up and supportive care  Anemia due to chronic illness She has multifactorial anemia, combination of anemia of chronic disease and iron deficiency I recommend a short course oral iron supplement Observe for now  Diarrhea This is likely due to side effects of treatment Despite taking Imodium on a regular basis, she still have frequent diarrhea I recommend the addition of Lomotil as needed Because of ongoing diarrhea, I do not plan to give her oral magnesium supplement  Hypomagnesemia This is due to recent diarrhea as well as urinary loss secondary to side effects of cisplatin I will increase pre-hydration magnesium replacement therapy this week while she is receiving treatment   No orders of the defined types were placed in this encounter.   All questions were answered. The patient knows to call the clinic with any problems, questions or concerns. The total time spent in the appointment was 20 minutes encounter with patients including review of chart and various tests results, discussions about plan of care and coordination of care plan   Heath Lark, MD 01/25/2020 12:44 PM  INTERVAL HISTORY: Please see below for problem oriented charting. She is seen prior to cycle 5 of treatment She continues to have frequent loose stool despite round-the-clock Imodium Her pain is better controlled with recent prescription hydrocodone Denies recent nausea No peripheral neuropathy The patient denies any recent signs or symptoms of  bleeding such as spontaneous epistaxis, hematuria or hematochezia.   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  On 6/10 she underwent Pap smear.  Last Pap smear had been 30 years prior and normal per patient.     11/21/2019 Pathology Results   A. CERVIX, BIOPSY:  -  Squamous cell carcinoma    11/29/2019 PET scan   1. Highly hypermetabolic cervical mass, with fluid distended endometrial cavity. Maximum SUV of the cervical mass is 25.8. 2. Left adnexal lesion versus more likely fundal fibroid, maximum SUV 6.0. Surveillance suggested. 3. There is low-grade activity in several abdominal lymph nodes including a borderline enlarged peripancreatic lymph node. However, there is striking activity in numerous enlarged thoracic lymph nodes, some of which have associated calcifications. It would be unusual to have this degree of thoracic adenopathy from cervical cancer without having a commensurate level of abdominopelvic adenopathy. This along with the calcifications of the lymph nodes raises the possibility of active granulomatous process (such as sarcoidosis) as a potential cause for the striking thoracic adenopathy. Concomitant lymphoma might also have a similar appearance. Sampling of thoracic lymph nodes may be helpful in differentiating metastatic disease other entities such as sarcoid or Lymphoma.  4. Mild pleural/subpleural nodularity in the lungs, merit surveillance. 5. Cholelithiasis.   11/30/2019 Cancer Staging   Staging form: Cervix Uteri, AJCC Version 9 - Clinical stage from 11/30/2019: FIGO Stage IIB (cT2b, cN0, cM0) - Signed by Heath Lark, MD on 11/30/2019  12/02/2019 Imaging   Ct abdomen and pelvis 1. Cervical soft tissue mass as described in keeping with known malignancy.  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 2. Mildly rounded subcentimeter aortocaval and right pelvic sidewall lymph nodes. 3. A 5.3 x 3.9 cm left fundal exophytic soft tissue suboptimally characterized but possibly a fibroid. 4. Cholelithiasis. 5. No bowel obstruction. Normal appendix. 6. Small bilateral lung base subpleural nodules similar to prior PET CT. Clinical correlation and follow-up recommended.   12/22/2019 Procedure   Successful placement of a right internal jugular approach power injectable Port-A-Cath. The catheter is ready for immediate use.     REVIEW OF SYSTEMS:   Constitutional: Denies fevers, chills or abnormal weight loss Eyes: Denies blurriness of vision Ears, nose, mouth, throat, and face: Denies mucositis or sore throat Respiratory: Denies cough, dyspnea or wheezes Cardiovascular: Denies palpitation, chest discomfort or lower extremity swelling Skin: Denies abnormal skin rashes Lymphatics: Denies new lymphadenopathy or easy bruising Neurological:Denies numbness, tingling or new weaknesses Behavioral/Psych: Mood is stable, no new changes  All other systems were reviewed with the patient and are negative.  I have reviewed the past medical history, past surgical history, social history and family history with the patient and they are unchanged from previous note.  ALLERGIES:  is allergic to nsaids.  MEDICATIONS:  Current Outpatient Medications  Medication Sig Dispense Refill  . acetaminophen (TYLENOL) 500 MG tablet Take 1,000 mg by mouth every 6 (six) hours as needed for moderate pain.    Marland Kitchen amLODipine (NORVASC) 5 MG tablet Take 1 tablet (5 mg total) by mouth daily. 90 tablet 1  . diphenoxylate-atropine (LOMOTIL) 2.5-0.025 MG tablet Take 1 tablet by mouth 4 (four) times daily as needed for diarrhea or loose stools. 60 tablet 0  . HYDROcodone-acetaminophen (NORCO/VICODIN) 5-325 MG tablet Take 1 tablet by mouth every 6 (six) hours  as needed. 60 tablet 0  . lidocaine-prilocaine (EMLA) cream Apply to affected area once 30 g 3  . ondansetron (ZOFRAN ODT) 4 MG disintegrating tablet Take 1 tablet (4 mg total) by mouth every 8 (eight) hours as needed for nausea or vomiting. 20 tablet 0  . ondansetron (ZOFRAN) 8 MG tablet Take 1 tablet (8 mg total) by mouth every 8 (eight) hours as needed. Start on the third day after chemotherapy. 30 tablet 1  . phenazopyridine (PYRIDIUM) 200 MG tablet Take 1 tablet (200 mg total) by mouth 3 (three) times daily. 6 tablet 0  . potassium chloride SA (KLOR-CON) 20 MEQ tablet Take 1 tablet (20 mEq total) by mouth daily. 10 tablet 0  . prochlorperazine (COMPAZINE) 10 MG tablet Take 1 tablet (10 mg total) by mouth every 6 (six) hours as needed (Nausea or vomiting). 30 tablet 1   No current facility-administered medications for this visit.    PHYSICAL EXAMINATION: ECOG PERFORMANCE STATUS: 1 - Symptomatic but completely ambulatory  Vitals:   01/25/20 1110  BP: 132/82  Pulse: 80  Resp: 18  Temp: (!) 97 F (36.1 C)  SpO2: 100%   Filed Weights   01/25/20 1110  Weight: 227 lb 12.8 oz (103.3 kg)    GENERAL:alert, no distress and comfortable NEURO: alert & oriented x 3 with fluent speech, no focal motor/sensory deficits  LABORATORY DATA:  I have reviewed the data as listed    Component Value Date/Time   NA 139 01/24/2020 1017   K 3.5 01/24/2020 1017   CL 103 01/24/2020 1017   CO2 29 01/24/2020 1017   GLUCOSE 109 (H)  01/24/2020 1017   BUN 7 01/24/2020 1017   CREATININE 0.83 01/24/2020 1017   CALCIUM 9.3 01/24/2020 1017   PROT 7.1 01/01/2020 1113   ALBUMIN 3.2 (L) 01/01/2020 1113   AST 12 (L) 01/01/2020 1113   AST 13 (L) 12/29/2019 0746   ALT 10 01/01/2020 1113   ALT 8 12/29/2019 0746   ALKPHOS 61 01/01/2020 1113   BILITOT 0.6 01/01/2020 1113   BILITOT 0.3 12/29/2019 0746   GFRNONAA >60 01/24/2020 1017   GFRAA >60 01/24/2020 1017    No results found for: SPEP, UPEP  Lab  Results  Component Value Date   WBC 4.5 01/24/2020   NEUTROABS 3.9 01/24/2020   HGB 10.0 (L) 01/24/2020   HCT 32.4 (L) 01/24/2020   MCV 77.1 (L) 01/24/2020   PLT 170 01/24/2020      Chemistry      Component Value Date/Time   NA 139 01/24/2020 1017   K 3.5 01/24/2020 1017   CL 103 01/24/2020 1017   CO2 29 01/24/2020 1017   BUN 7 01/24/2020 1017   CREATININE 0.83 01/24/2020 1017      Component Value Date/Time   CALCIUM 9.3 01/24/2020 1017   ALKPHOS 61 01/01/2020 1113   AST 12 (L) 01/01/2020 1113   AST 13 (L) 12/29/2019 0746   ALT 10 01/01/2020 1113   ALT 8 12/29/2019 0746   BILITOT 0.6 01/01/2020 1113   BILITOT 0.3 12/29/2019 0746       RADIOGRAPHIC STUDIES: I have personally reviewed the radiological images as listed and agreed with the findings in the report. CT Abdomen Pelvis W Contrast  Result Date: 01/01/2020 CLINICAL DATA:  Pelvic pain, negative beta HCG. EXAM: CT ABDOMEN AND PELVIS WITH CONTRAST TECHNIQUE: Multidetector CT imaging of the abdomen and pelvis was performed using the standard protocol following bolus administration of intravenous contrast. CONTRAST:  128mL OMNIPAQUE IOHEXOL 300 MG/ML  SOLN COMPARISON:  CT abdomen and pelvis dated 12/02/2019. FINDINGS: Lower chest: Mild bibasilar atelectasis. Hepatobiliary: Probable cholelithiasis. No pericholecystic inflammation. No focal liver abnormality. No bile duct dilatation. Pancreas: Unremarkable. No pancreatic ductal dilatation or surrounding inflammatory changes. Spleen: Normal in size without focal abnormality. Adrenals/Urinary Tract: Adrenal glands appear normal. Kidneys are unremarkable without mass, stone or hydronephrosis. No ureteral or bladder calculi are identified. Bladder is unremarkable, partially decompressed. Stomach/Bowel: No dilated large or small bowel loops. Scattered diverticulosis but no focal inflammatory change to suggest acute diverticulitis. Appendix is normal. Stomach is unremarkable, partially  decompressed. Vascular/Lymphatic: No acute/significant vascular abnormality. No enlarged lymph nodes seen. Reproductive: Irregular masslike thickening at the level of the cervix is stable in the short-term interval. Associated fluid distending the endometrial canal indicating associated cervical stenosis/obstruction. Additional solid-appearing mass exophytic to the LEFT lateral uterus, measuring approximately 5 cm greatest dimension, most likely a fibroid. No free fluid or additional adnexal mass. Other: No abscess collection or free intraperitoneal air. Musculoskeletal: No acute or suspicious osseous finding. Degenerative spondylosis of the mid and lower lumbar spine, mild to moderate in degree. IMPRESSION: 1. No acute findings within the abdomen or pelvis. No bowel obstruction or evidence of bowel wall inflammation. No renal or ureteral calculi. Appendix is normal. 2. Irregular masslike thickening is again seen at the level of the cervix, stable in the short-term interval, compatible with known cervical cancer (per clinical data provided on PET-CT of 11/29/2019). 3. Presumed fibroid exophytic to the LEFT lateral body of the uterus, measuring approximately 5 cm greatest dimension. 4. Colonic diverticulosis without evidence of acute diverticulitis.  5. Cholelithiasis without evidence of acute cholecystitis. Electronically Signed   By: Franki Cabot M.D.   On: 01/01/2020 12:44

## 2020-01-25 NOTE — Assessment & Plan Note (Signed)
This is due to recent diarrhea as well as urinary loss secondary to side effects of cisplatin I will increase pre-hydration magnesium replacement therapy this week while she is receiving treatment

## 2020-01-25 NOTE — Assessment & Plan Note (Addendum)
Overall, she tolerated treatment well except for mild anemia, diarrhea and low magnesium We will proceed with treatment as scheduled She will complete chemotherapy this week I will see her again next week for further follow-up and supportive care

## 2020-01-25 NOTE — Assessment & Plan Note (Signed)
She has multifactorial anemia, combination of anemia of chronic disease and iron deficiency I recommend a short course oral iron supplement Observe for now

## 2020-01-26 ENCOUNTER — Ambulatory Visit
Admission: RE | Admit: 2020-01-26 | Discharge: 2020-01-26 | Disposition: A | Discharge status: Home / Self Care | Payer: BC Managed Care – PPO | Source: Ambulatory Visit | Attending: Radiation Oncology | Admitting: Radiation Oncology

## 2020-01-26 ENCOUNTER — Telehealth: Payer: Self-pay | Admitting: Hematology and Oncology

## 2020-01-26 ENCOUNTER — Other Ambulatory Visit: Payer: Self-pay

## 2020-01-26 DIAGNOSIS — Z510 Encounter for antineoplastic radiation therapy: Secondary | ICD-10-CM | POA: Diagnosis not present

## 2020-01-26 NOTE — Telephone Encounter (Signed)
Per 8/25 los, no changes made to pt schedule   

## 2020-01-27 ENCOUNTER — Inpatient Hospital Stay: Payer: BC Managed Care – PPO

## 2020-01-27 ENCOUNTER — Ambulatory Visit
Admission: RE | Admit: 2020-01-27 | Discharge: 2020-01-27 | Disposition: A | Discharge status: Home / Self Care | Payer: BC Managed Care – PPO | Source: Ambulatory Visit | Attending: Radiation Oncology | Admitting: Radiation Oncology

## 2020-01-27 ENCOUNTER — Other Ambulatory Visit: Payer: Self-pay

## 2020-01-27 VITALS — BP 151/81 | HR 79 | Temp 98.6°F | Resp 18

## 2020-01-27 DIAGNOSIS — Z510 Encounter for antineoplastic radiation therapy: Secondary | ICD-10-CM | POA: Diagnosis not present

## 2020-01-27 DIAGNOSIS — Z5111 Encounter for antineoplastic chemotherapy: Secondary | ICD-10-CM | POA: Diagnosis not present

## 2020-01-27 DIAGNOSIS — C539 Malignant neoplasm of cervix uteri, unspecified: Secondary | ICD-10-CM

## 2020-01-27 MED ORDER — PALONOSETRON HCL INJECTION 0.25 MG/5ML
0.2500 mg | Freq: Once | INTRAVENOUS | Status: AC
  Administered 2020-01-27: 0.25 mg via INTRAVENOUS

## 2020-01-27 MED ORDER — SODIUM CHLORIDE 0.9 % IV SOLN
Freq: Once | INTRAVENOUS | Status: AC
  Filled 2020-01-27: qty 250

## 2020-01-27 MED ORDER — SODIUM CHLORIDE 0.9% FLUSH
10.0000 mL | INTRAVENOUS | Status: DC | PRN
  Filled 2020-01-27: qty 10

## 2020-01-27 MED ORDER — PALONOSETRON HCL INJECTION 0.25 MG/5ML
INTRAVENOUS | Status: AC
  Filled 2020-01-27: qty 5

## 2020-01-27 MED ORDER — HEPARIN SOD (PORK) LOCK FLUSH 100 UNIT/ML IV SOLN
500.0000 [IU] | Freq: Once | INTRAVENOUS | Status: DC | PRN
  Filled 2020-01-27: qty 5

## 2020-01-27 MED ORDER — POTASSIUM CHLORIDE 2 MEQ/ML IV SOLN
Freq: Once | INTRAVENOUS | Status: AC
  Filled 2020-01-27: qty 10

## 2020-01-27 MED ORDER — SODIUM CHLORIDE 0.9 % IV SOLN
150.0000 mg | Freq: Once | INTRAVENOUS | Status: AC
  Administered 2020-01-27: 150 mg via INTRAVENOUS
  Filled 2020-01-27: qty 150

## 2020-01-27 MED ORDER — SODIUM CHLORIDE 0.9 % IV SOLN
10.0000 mg | Freq: Once | INTRAVENOUS | Status: AC
  Administered 2020-01-27: 10 mg via INTRAVENOUS
  Filled 2020-01-27: qty 10

## 2020-01-27 MED ORDER — SODIUM CHLORIDE 0.9 % IV SOLN
40.0000 mg/m2 | Freq: Once | INTRAVENOUS | Status: AC
  Administered 2020-01-27: 74 mg via INTRAVENOUS
  Filled 2020-01-27: qty 74

## 2020-01-27 NOTE — Patient Instructions (Signed)
[facility] Discharge Instructions for Patients Receiving Chemotherapy  Today you received the following chemotherapy agents Cisplatin (PLATINOL).  To help prevent nausea and vomiting after your treatment, we encourage you to take your nausea medication as prescribed.   If you develop nausea and vomiting that is not controlled by your nausea medication, call the clinic.   BELOW ARE SYMPTOMS THAT SHOULD BE REPORTED IMMEDIATELY:  *FEVER GREATER THAN 100.5 F  *CHILLS WITH OR WITHOUT FEVER  NAUSEA AND VOMITING THAT IS NOT CONTROLLED WITH YOUR NAUSEA MEDICATION  *UNUSUAL SHORTNESS OF BREATH  *UNUSUAL BRUISING OR BLEEDING  TENDERNESS IN MOUTH AND THROAT WITH OR WITHOUT PRESENCE OF ULCERS  *URINARY PROBLEMS  *BOWEL PROBLEMS  UNUSUAL RASH Items with * indicate a potential emergency and should be followed up as soon as possible.  Feel free to call the clinic should you have any questions or concerns. The clinic phone number is [phone].  Please show the CHEMO ALERT CARD at check-in to the Emergency Department and triage nurse.   

## 2020-01-28 LAB — ACID FAST CULTURE WITH REFLEXED SENSITIVITIES (MYCOBACTERIA)
Acid Fast Culture: NEGATIVE
Acid Fast Culture: NEGATIVE

## 2020-01-30 ENCOUNTER — Other Ambulatory Visit: Payer: Self-pay

## 2020-01-30 ENCOUNTER — Ambulatory Visit
Admission: RE | Admit: 2020-01-30 | Discharge: 2020-01-30 | Disposition: A | Discharge status: Home / Self Care | Payer: BC Managed Care – PPO | Source: Ambulatory Visit | Attending: Radiation Oncology | Admitting: Radiation Oncology

## 2020-01-30 DIAGNOSIS — Z510 Encounter for antineoplastic radiation therapy: Secondary | ICD-10-CM | POA: Diagnosis not present

## 2020-01-31 ENCOUNTER — Ambulatory Visit
Admission: RE | Admit: 2020-01-31 | Discharge: 2020-01-31 | Disposition: A | Discharge status: Home / Self Care | Payer: BC Managed Care – PPO | Source: Ambulatory Visit | Attending: Radiation Oncology | Admitting: Radiation Oncology

## 2020-01-31 ENCOUNTER — Other Ambulatory Visit: Payer: Self-pay

## 2020-01-31 ENCOUNTER — Inpatient Hospital Stay: Payer: BC Managed Care – PPO

## 2020-01-31 DIAGNOSIS — Z5111 Encounter for antineoplastic chemotherapy: Secondary | ICD-10-CM | POA: Diagnosis not present

## 2020-01-31 DIAGNOSIS — Z510 Encounter for antineoplastic radiation therapy: Secondary | ICD-10-CM | POA: Diagnosis not present

## 2020-01-31 DIAGNOSIS — C539 Malignant neoplasm of cervix uteri, unspecified: Secondary | ICD-10-CM

## 2020-01-31 LAB — BASIC METABOLIC PANEL - CANCER CENTER ONLY
Anion gap: 8 (ref 5–15)
BUN: 6 mg/dL (ref 6–20)
CO2: 28 mmol/L (ref 22–32)
Calcium: 9.4 mg/dL (ref 8.9–10.3)
Chloride: 103 mmol/L (ref 98–111)
Creatinine: 0.78 mg/dL (ref 0.44–1.00)
GFR, Est AFR Am: >60 mL/min (ref >60)
GFR, Estimated: >60 mL/min (ref >60)
Glucose, Bld: 95 mg/dL (ref 70–99)
Potassium: 3.4 mmol/L — ABNORMAL LOW (ref 3.5–5.1)
Sodium: 139 mmol/L (ref 135–145)

## 2020-01-31 LAB — CBC WITH DIFFERENTIAL (CANCER CENTER ONLY)
Abs Immature Granulocytes: 0 10*3/uL (ref 0.00–0.07)
Basophils Absolute: 0 10*3/uL (ref 0.0–0.1)
Basophils Relative: 0 %
Eosinophils Absolute: 0 10*3/uL (ref 0.0–0.5)
Eosinophils Relative: 1 %
HCT: 29.5 % — ABNORMAL LOW (ref 36.0–46.0)
Hemoglobin: 9.1 g/dL — ABNORMAL LOW (ref 12.0–15.0)
Immature Granulocytes: 0 %
Lymphocytes Relative: 5 %
Lymphs Abs: 0.2 10*3/uL — ABNORMAL LOW (ref 0.7–4.0)
MCH: 23.8 pg — ABNORMAL LOW (ref 26.0–34.0)
MCHC: 30.8 g/dL (ref 30.0–36.0)
MCV: 77.2 fL — ABNORMAL LOW (ref 80.0–100.0)
Monocytes Absolute: 0.2 10*3/uL (ref 0.1–1.0)
Monocytes Relative: 8 %
Neutro Abs: 2.4 10*3/uL (ref 1.7–7.7)
Neutrophils Relative %: 86 %
Platelet Count: 155 10*3/uL (ref 150–400)
RBC: 3.82 MIL/uL — ABNORMAL LOW (ref 3.87–5.11)
RDW: 18.3 % — ABNORMAL HIGH (ref 11.5–15.5)
WBC Count: 2.8 10*3/uL — ABNORMAL LOW (ref 4.0–10.5)
nRBC: 0 % (ref 0.0–0.2)

## 2020-01-31 LAB — MAGNESIUM: Magnesium: 1.4 mg/dL — CL (ref 1.7–2.4)

## 2020-01-31 NOTE — Progress Notes (Signed)
Critical lab called from [facility] in lab, magnesium 1.7. Dr Alvy Bimler made aware.

## 2020-02-01 ENCOUNTER — Other Ambulatory Visit: Payer: Self-pay

## 2020-02-01 ENCOUNTER — Ambulatory Visit
Admission: RE | Admit: 2020-02-01 | Discharge: 2020-02-01 | Disposition: A | Discharge status: Home / Self Care | Payer: BC Managed Care – PPO | Source: Ambulatory Visit | Attending: Radiation Oncology | Admitting: Radiation Oncology

## 2020-02-01 ENCOUNTER — Ambulatory Visit: Payer: BC Managed Care – PPO

## 2020-02-01 DIAGNOSIS — Z510 Encounter for antineoplastic radiation therapy: Secondary | ICD-10-CM | POA: Insufficient documentation

## 2020-02-01 DIAGNOSIS — C539 Malignant neoplasm of cervix uteri, unspecified: Secondary | ICD-10-CM | POA: Insufficient documentation

## 2020-02-02 ENCOUNTER — Ambulatory Visit
Admission: RE | Admit: 2020-02-02 | Discharge: 2020-02-02 | Disposition: A | Discharge status: Home / Self Care | Payer: BC Managed Care – PPO | Source: Ambulatory Visit | Attending: Radiation Oncology | Admitting: Radiation Oncology

## 2020-02-02 ENCOUNTER — Ambulatory Visit: Payer: BC Managed Care – PPO

## 2020-02-02 ENCOUNTER — Encounter: Payer: Self-pay | Admitting: Hematology and Oncology

## 2020-02-02 ENCOUNTER — Other Ambulatory Visit: Payer: Self-pay

## 2020-02-02 ENCOUNTER — Inpatient Hospital Stay: Payer: BC Managed Care – PPO | Attending: Gynecologic Oncology | Admitting: Hematology and Oncology

## 2020-02-02 ENCOUNTER — Telehealth: Payer: Self-pay | Admitting: Hematology and Oncology

## 2020-02-02 DIAGNOSIS — C539 Malignant neoplasm of cervix uteri, unspecified: Secondary | ICD-10-CM

## 2020-02-02 DIAGNOSIS — R197 Diarrhea, unspecified: Secondary | ICD-10-CM | POA: Diagnosis not present

## 2020-02-02 DIAGNOSIS — D61818 Other pancytopenia: Secondary | ICD-10-CM | POA: Diagnosis not present

## 2020-02-02 DIAGNOSIS — Z79899 Other long term (current) drug therapy: Secondary | ICD-10-CM | POA: Diagnosis not present

## 2020-02-02 NOTE — Assessment & Plan Note (Signed)
The cause of the pancytopenia is multifactorial, related to side effects of treatment She is not symptomatic Observe for now

## 2020-02-02 NOTE — Telephone Encounter (Signed)
Scheduled appts per 9/2 sch msg. Gave pt a print out of AVS.

## 2020-02-02 NOTE — Assessment & Plan Note (Signed)
This is likely due to side effects of treatment Her diarrhea is improving She has Imodium and Lomotil to take as needed

## 2020-02-02 NOTE — Progress Notes (Signed)
[place] OFFICE PROGRESS NOTE  Patient Care Team: Gildardo Pounds, NP as PCP - General (Nurse Practitioner) Jacqulyn Liner, RN as Oncology Nurse Navigator (Oncology)  ASSESSMENT & PLAN:  Squamous cell carcinoma of cervix [facility] [place]) She has completed chemotherapy She is distressed about the prospect of further radiation treatment I tried to reassure the patient that the therapy she is receiving currently is standard of care I will see her again in 2 months for further follow-up  Pancytopenia, acquired ([place]) The cause of the pancytopenia is multifactorial, related to side effects of treatment She is not symptomatic Observe for now  Hypomagnesemia This is due to side effects of cisplatin I recommend conservative approach rather than oral magnesium supplement due to risk of diarrhea She is not symptomatic Observe for now  Diarrhea This is likely due to side effects of treatment Her diarrhea is improving She has Imodium and Lomotil to take as needed   No orders of the defined types were placed in this encounter.   All questions were answered. The patient knows to call the clinic with any problems, questions or concerns. The total time spent in the appointment was 20 minutes encounter with patients including review of chart and various tests results, discussions about plan of care and coordination of care plan   Heath Lark, MD 02/02/2020 2:23 PM  INTERVAL HISTORY: Please see below for problem oriented charting. 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  On 6/10 she underwent Pap smear.  Last Pap smear had been 30 years prior and normal per patient.     11/21/2019 Pathology Results   A. CERVIX, BIOPSY:  -  Squamous cell carcinoma    11/29/2019 PET scan   1. Highly hypermetabolic cervical mass, with fluid distended endometrial cavity. Maximum SUV of the cervical mass is 25.8. 2. Left adnexal lesion versus more likely fundal fibroid, maximum SUV 6.0. Surveillance suggested. 3. There is low-grade activity in several abdominal lymph nodes including a borderline enlarged peripancreatic lymph node. However, there is striking activity in numerous enlarged thoracic lymph nodes, some of which have associated calcifications. It would be unusual to have this degree of thoracic adenopathy from cervical cancer without having a commensurate level of abdominopelvic adenopathy. This along with the calcifications of the lymph nodes raises the possibility of active granulomatous process (such as sarcoidosis) as a potential cause for the striking thoracic adenopathy. Concomitant lymphoma might also have a similar appearance. Sampling of thoracic lymph nodes may be helpful in differentiating metastatic disease other entities such as sarcoid or Lymphoma.  4. Mild pleural/subpleural nodularity in the lungs, merit surveillance. 5. Cholelithiasis.   11/30/2019 Cancer Staging   Staging form: Cervix Uteri, AJCC Version 9 - Clinical stage from 11/30/2019: FIGO Stage IIB (cT2b, cN0, cM0) - Signed by Heath Lark, MD on 11/30/2019   12/02/2019 Imaging   Ct abdomen and pelvis 1. Cervical soft tissue mass as described in keeping with known malignancy. 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 2. Mildly rounded subcentimeter aortocaval and right pelvic sidewall lymph nodes. 3. A 5.3 x  3.9 cm left fundal exophytic soft tissue suboptimally characterized but possibly a fibroid. 4. Cholelithiasis. 5. No bowel obstruction. Normal  appendix. 6. Small bilateral lung base subpleural nodules similar to prior PET CT. Clinical correlation and follow-up recommended.   12/22/2019 Procedure   Successful placement of a right internal jugular approach power injectable Port-A-Cath. The catheter is ready for immediate use.     REVIEW OF SYSTEMS:   Constitutional: Denies fevers, chills or abnormal weight loss Eyes: Denies blurriness of vision Ears, nose, mouth, throat, and face: Denies mucositis or sore throat Respiratory: Denies cough, dyspnea or wheezes Cardiovascular: Denies palpitation, chest discomfort or lower extremity swelling Skin: Denies abnormal skin rashes Lymphatics: Denies new lymphadenopathy or easy bruising Neurological:Denies numbness, tingling or new weaknesses Behavioral/Psych: Mood is stable, no new changes  All other systems were reviewed with the patient and are negative.  I have reviewed the past medical history, past surgical history, social history and family history with the patient and they are unchanged from previous note.  ALLERGIES:  is allergic to nsaids.  MEDICATIONS:  Current Outpatient Medications  Medication Sig Dispense Refill  . acetaminophen (TYLENOL) 500 MG tablet Take 1,000 mg by mouth every 6 (six) hours as needed for moderate pain.    Marland Kitchen amLODipine (NORVASC) 5 MG tablet Take 1 tablet (5 mg total) by mouth daily. 90 tablet 1  . diphenoxylate-atropine (LOMOTIL) 2.5-0.025 MG tablet Take 1 tablet by mouth 4 (four) times daily as needed for diarrhea or loose stools. 60 tablet 0  . HYDROcodone-acetaminophen (NORCO/VICODIN) 5-325 MG tablet Take 1 tablet by mouth every 6 (six) hours as needed. 60 tablet 0  . lidocaine-prilocaine (EMLA) cream Apply to affected area once 30 g 3  . ondansetron (ZOFRAN ODT) 4 MG disintegrating tablet Take 1 tablet (4 mg total) by mouth every 8 (eight) hours as needed for nausea or vomiting. 20 tablet 0  . ondansetron (ZOFRAN) 8 MG tablet Take 1 tablet (8 mg  total) by mouth every 8 (eight) hours as needed. Start on the third day after chemotherapy. 30 tablet 1  . prochlorperazine (COMPAZINE) 10 MG tablet Take 1 tablet (10 mg total) by mouth every 6 (six) hours as needed (Nausea or vomiting). 30 tablet 1   No current facility-administered medications for this visit.    PHYSICAL EXAMINATION: ECOG PERFORMANCE STATUS: 1 - Symptomatic but completely ambulatory  Vitals:   02/02/20 1100  BP: (!) 150/82  Pulse: (!) 106  Resp: 18  Temp: (!) 97.5 F (36.4 C)  SpO2: 98%   Filed Weights   02/02/20 1100  Weight: 224 lb (101.6 kg)    GENERAL:alert, somewhat tearful NEURO: alert & oriented x 3 with fluent speech, no focal motor/sensory deficits  LABORATORY DATA:  I have reviewed the data as listed    Component Value Date/Time   NA 139 01/31/2020 0945   K 3.4 (L) 01/31/2020 0945   CL 103 01/31/2020 0945   CO2 28 01/31/2020 0945   GLUCOSE 95 01/31/2020 0945   BUN 6 01/31/2020 0945   CREATININE 0.78 01/31/2020 0945   CALCIUM 9.4 01/31/2020 0945   PROT 7.1 01/01/2020 1113   ALBUMIN 3.2 (L) 01/01/2020 1113   AST 12 (L) 01/01/2020 1113   AST 13 (L) 12/29/2019 0746   ALT 10 01/01/2020 1113   ALT 8 12/29/2019 0746   ALKPHOS 61 01/01/2020 1113   BILITOT 0.6 01/01/2020 1113   BILITOT 0.3 12/29/2019 0746   GFRNONAA >60 01/31/2020 0945   GFRAA >60 01/31/2020  0945    No results found for: SPEP, UPEP  Lab Results  Component Value Date   WBC 2.8 (L) 01/31/2020   NEUTROABS 2.4 01/31/2020   HGB 9.1 (L) 01/31/2020   HCT 29.5 (L) 01/31/2020   MCV 77.2 (L) 01/31/2020   PLT 155 01/31/2020      Chemistry      Component Value Date/Time   NA 139 01/31/2020 0945   K 3.4 (L) 01/31/2020 0945   CL 103 01/31/2020 0945   CO2 28 01/31/2020 0945   BUN 6 01/31/2020 0945   CREATININE 0.78 01/31/2020 0945      Component Value Date/Time   CALCIUM 9.4 01/31/2020 0945   ALKPHOS 61 01/01/2020 1113   AST 12 (L) 01/01/2020 1113   AST 13 (L)  12/29/2019 0746   ALT 10 01/01/2020 1113   ALT 8 12/29/2019 0746   BILITOT 0.6 01/01/2020 1113   BILITOT 0.3 12/29/2019 0746

## 2020-02-02 NOTE — Assessment & Plan Note (Signed)
She has completed chemotherapy She is distressed about the prospect of further radiation treatment I tried to reassure the patient that the therapy she is receiving currently is standard of care I will see her again in 2 months for further follow-up

## 2020-02-02 NOTE — Assessment & Plan Note (Signed)
This is due to side effects of cisplatin I recommend conservative approach rather than oral magnesium supplement due to risk of diarrhea She is not symptomatic Observe for now

## 2020-02-03 ENCOUNTER — Ambulatory Visit
Admission: RE | Admit: 2020-02-03 | Discharge: 2020-02-03 | Disposition: A | Discharge status: Home / Self Care | Payer: BC Managed Care – PPO | Source: Ambulatory Visit | Attending: Radiation Oncology | Admitting: Radiation Oncology

## 2020-02-03 DIAGNOSIS — C539 Malignant neoplasm of cervix uteri, unspecified: Secondary | ICD-10-CM | POA: Diagnosis not present

## 2020-02-07 ENCOUNTER — Encounter: Payer: Self-pay | Admitting: Hematology and Oncology

## 2020-02-07 ENCOUNTER — Ambulatory Visit
Admission: RE | Admit: 2020-02-07 | Discharge: 2020-02-07 | Disposition: A | Discharge status: Home / Self Care | Payer: BC Managed Care – PPO | Source: Ambulatory Visit | Attending: Radiation Oncology | Admitting: Radiation Oncology

## 2020-02-07 ENCOUNTER — Other Ambulatory Visit: Payer: Self-pay

## 2020-02-07 ENCOUNTER — Other Ambulatory Visit: Payer: Self-pay | Admitting: Hematology and Oncology

## 2020-02-07 ENCOUNTER — Telehealth: Payer: Self-pay | Admitting: *Deleted

## 2020-02-07 DIAGNOSIS — C539 Malignant neoplasm of cervix uteri, unspecified: Secondary | ICD-10-CM | POA: Diagnosis not present

## 2020-02-07 MED ORDER — HYDROCODONE-ACETAMINOPHEN 5-325 MG PO TABS
1.0000 | ORAL_TABLET | Freq: Four times a day (QID) | ORAL | 0 refills | Status: DC | PRN
Start: 2020-02-07 — End: 2020-02-23

## 2020-02-07 NOTE — Progress Notes (Signed)
Patient came to inquire about Advertising account executive.  Advised what is needed and gave her Ailene Ravel and Adrienne's names to submit to as patient has completed chemo and is now in Radiation.

## 2020-02-07 NOTE — Telephone Encounter (Signed)
Pt called requesting refill for pain med. Dr.Gorsuch refilled pain med and requested that she calls for refills before coming to MD appts with Dr.Gorsuch. Message was left on pt personal cell. Advised to call with any other concerns

## 2020-02-08 ENCOUNTER — Ambulatory Visit: Payer: BC Managed Care – PPO

## 2020-02-08 ENCOUNTER — Ambulatory Visit
Admission: RE | Admit: 2020-02-08 | Discharge: 2020-02-08 | Disposition: A | Discharge status: Home / Self Care | Payer: BC Managed Care – PPO | Source: Ambulatory Visit | Attending: Radiation Oncology | Admitting: Radiation Oncology

## 2020-02-08 ENCOUNTER — Telehealth: Payer: Self-pay | Admitting: Oncology

## 2020-02-08 ENCOUNTER — Other Ambulatory Visit: Payer: Self-pay

## 2020-02-08 DIAGNOSIS — C539 Malignant neoplasm of cervix uteri, unspecified: Secondary | ICD-10-CM | POA: Diagnosis not present

## 2020-02-08 NOTE — Telephone Encounter (Signed)
Anne Harris called back and we discussed upcoming tandem and ring treatments. She is upset because she did not know that she would need more radiation and is feeling very fatigued.  Went over what to expect with the treatments and advised her that I will meet with her tomorrow when she is here for her last external beam apt.

## 2020-02-08 NOTE — Telephone Encounter (Signed)
Left a message regarding upcoming treatments.  Requested a return call.

## 2020-02-09 ENCOUNTER — Other Ambulatory Visit: Payer: Self-pay

## 2020-02-09 ENCOUNTER — Encounter: Payer: Self-pay | Admitting: Oncology

## 2020-02-09 ENCOUNTER — Ambulatory Visit
Admission: RE | Admit: 2020-02-09 | Discharge: 2020-02-09 | Disposition: A | Discharge status: Home / Self Care | Payer: BC Managed Care – PPO | Source: Ambulatory Visit | Attending: Radiation Oncology | Admitting: Radiation Oncology

## 2020-02-09 ENCOUNTER — Encounter: Payer: Self-pay | Admitting: Radiation Oncology

## 2020-02-09 DIAGNOSIS — Z923 Personal history of irradiation: Secondary | ICD-10-CM

## 2020-02-09 DIAGNOSIS — C539 Malignant neoplasm of cervix uteri, unspecified: Secondary | ICD-10-CM | POA: Diagnosis not present

## 2020-02-09 HISTORY — DX: Personal history of irradiation: Z92.3

## 2020-02-09 NOTE — Progress Notes (Signed)
Met with Anne Harris before her last treatment.  She does not have any questions at this time.  Advised her to call if she does have any questions or needs.

## 2020-02-10 NOTE — Progress Notes (Signed)
Tried calling pt to do pre-op interview, however, her phone is not excepting calls at this time.  Called pt's son, Doren Custard (whom is listed as her emergency contact).  Son stated that his mother does want to have further radiation treatment until she has a CT done and has more information about the boost radiation.  Son stated that his mother feels very overwhelmed and just needs more information.  Suggested to son if Dr Sondra Come would speak with her about the radiation would that help since he is the one whom is the radiation oncologist .  Told pt son that I would call Dr Sondra Come office and request this but may not hear from him today probably Monday.  Informed his mother would have to have a covid test done prior to procedure so that latest would be Wednesday morning 02-15-2020 and I would need to update health history and give instructions.  Son verbalized understanding of recommendation and feels that his mother would feel better.   Called and spoke w/ Enid Derry , Network engineer for Dr Sondra Come office,  Informed her pt's decision.  Enid Derry stated she inform the nurse and leave message for Dr Sondra Come since he is not in the office today.

## 2020-02-13 ENCOUNTER — Ambulatory Visit: Payer: Medicaid Other | Admitting: Nurse Practitioner

## 2020-02-14 NOTE — Progress Notes (Signed)
Called and left voice mail message for shae patient need to speak with dr Sondra Come about procedure on 02-16-2020 and has not had covid test yet

## 2020-02-16 ENCOUNTER — Encounter (HOSPITAL_BASED_OUTPATIENT_CLINIC_OR_DEPARTMENT_OTHER): Admission: RE | Payer: Self-pay | Source: Home / Self Care

## 2020-02-16 ENCOUNTER — Ambulatory Visit
Admission: RE | Admit: 2020-02-16 | Discharge: 2020-02-16 | Disposition: A | Discharge status: Home / Self Care | Payer: BC Managed Care – PPO | Source: Ambulatory Visit | Attending: Radiation Oncology | Admitting: Radiation Oncology

## 2020-02-16 ENCOUNTER — Ambulatory Visit (HOSPITAL_BASED_OUTPATIENT_CLINIC_OR_DEPARTMENT_OTHER)
Admission: RE | Admit: 2020-02-16 | Discharge: 2020-02-16 | Disposition: A | Discharge status: Home / Self Care | Payer: BC Managed Care – PPO | Source: Ambulatory Visit | Attending: Radiation Oncology | Admitting: Radiation Oncology

## 2020-02-16 ENCOUNTER — Ambulatory Visit (HOSPITAL_BASED_OUTPATIENT_CLINIC_OR_DEPARTMENT_OTHER)
Admission: RE | Admit: 2020-02-16 | Payer: BC Managed Care – PPO | Source: Home / Self Care | Admitting: Radiation Oncology

## 2020-02-16 ENCOUNTER — Telehealth: Payer: Self-pay

## 2020-02-16 DIAGNOSIS — C539 Malignant neoplasm of cervix uteri, unspecified: Secondary | ICD-10-CM

## 2020-02-16 SURGERY — INSERTION, UTERINE TANDEM AND RING OR CYLINDER, FOR BRACHYTHERAPY
Anesthesia: General

## 2020-02-16 NOTE — Telephone Encounter (Signed)
Patient called and reports she cannot come to her appointment today and needs to reschedule. When patient was called back she reports that she needs to make decisions about her tx and that we are not to talk with her son. She only wants a CT to see if the chemo and radiation have changed her tumor size. She states she is willing to have additional treatments if needed but wants to know what the CT shows now. Pt. Advised to come in on Monday, 9/30 at 1 15pm for a 130 pm CT in Radiation (CT Sim). Advised Dr. Sondra Come will call her with  the results. Patient verbalized understanding and plans to come to her appointment.

## 2020-02-20 ENCOUNTER — Telehealth: Payer: Self-pay

## 2020-02-20 ENCOUNTER — Ambulatory Visit
Admission: RE | Admit: 2020-02-20 | Discharge: 2020-02-20 | Disposition: A | Discharge status: Home / Self Care | Payer: BC Managed Care – PPO | Source: Ambulatory Visit | Attending: Radiation Oncology | Admitting: Radiation Oncology

## 2020-02-20 ENCOUNTER — Other Ambulatory Visit: Payer: Self-pay

## 2020-02-20 DIAGNOSIS — C539 Malignant neoplasm of cervix uteri, unspecified: Secondary | ICD-10-CM

## 2020-02-20 NOTE — Telephone Encounter (Signed)
She called and left a message to call her.  Called back. She is asking for a copy of of FMLA paper work. Called Roz and given message.  Notified Roz that prior authorization needed for hydrocodone Rx. Patient  Is aware of the above.

## 2020-02-21 NOTE — Progress Notes (Signed)
Spoke with pt at cell number and pt wants to start procedures Monday 02-27-2020, pt given number to call shirley at to make dr Sondra Come aware, pt to call back and scheduled covid test for Thursday or friday this week after arranging  Transportation and will do medcial history then

## 2020-02-22 ENCOUNTER — Telehealth: Payer: Self-pay | Admitting: *Deleted

## 2020-02-22 NOTE — Telephone Encounter (Signed)
N/A response per CoverMyMeds for Norco 5-325 mg Prior Authorization request.  Previously approved under PA case: [id number] which does not expire until 07/15/2020.  Advised to resubmit this request within 30-days of authorization expiration date.    [facility] notified yet continue to receive P.A. notice. Reviewed RxBin, PCN and Group.  Unable to confirm RxID number.  [facility] to return call to this nurse after calling plan.  Awaiting return call.    Patient currently using Tylenol  ES as needed for abdominal pain with heating pad.    [facility] provided [facility] Prescription Benefit information.  Patient is active per [employer].  If Norco is a 30-day supply verses 15 day supply authorization will not be needed.  Message left for [facility].

## 2020-02-23 ENCOUNTER — Encounter (HOSPITAL_BASED_OUTPATIENT_CLINIC_OR_DEPARTMENT_OTHER): Admission: RE | Payer: Self-pay | Source: Home / Self Care

## 2020-02-23 ENCOUNTER — Other Ambulatory Visit (HOSPITAL_COMMUNITY): Payer: BC Managed Care – PPO

## 2020-02-23 ENCOUNTER — Other Ambulatory Visit: Payer: Self-pay | Admitting: Hematology and Oncology

## 2020-02-23 ENCOUNTER — Ambulatory Visit (HOSPITAL_BASED_OUTPATIENT_CLINIC_OR_DEPARTMENT_OTHER)
Admission: RE | Admit: 2020-02-23 | Payer: BC Managed Care – PPO | Source: Home / Self Care | Admitting: Radiation Oncology

## 2020-02-23 ENCOUNTER — Ambulatory Visit (HOSPITAL_BASED_OUTPATIENT_CLINIC_OR_DEPARTMENT_OTHER): Payer: BC Managed Care – PPO | Attending: Radiation Oncology

## 2020-02-23 ENCOUNTER — Ambulatory Visit
Admission: RE | Admit: 2020-02-23 | Discharge: 2020-02-23 | Disposition: A | Discharge status: Home / Self Care | Payer: BC Managed Care – PPO | Source: Ambulatory Visit | Attending: Radiation Oncology | Admitting: Radiation Oncology

## 2020-02-23 ENCOUNTER — Ambulatory Visit: Payer: BC Managed Care – PPO | Admitting: Radiation Oncology

## 2020-02-23 SURGERY — INSERTION, UTERINE TANDEM AND RING OR CYLINDER, FOR BRACHYTHERAPY
Anesthesia: General

## 2020-02-23 MED ORDER — HYDROCODONE-ACETAMINOPHEN 5-325 MG PO TABS
1.0000 | ORAL_TABLET | Freq: Two times a day (BID) | ORAL | 0 refills | Status: DC | PRN
Start: 2020-02-23 — End: 2021-02-28

## 2020-02-23 NOTE — Telephone Encounter (Signed)
New order submitted for Hydrocodone-Acetaminophen 5-325 mg authorized per [employer]. Jamyrah D Elman notified advised to bring [employer] card on next visit.  Secondary [employer] community plan terms 03/01/2020.   "Thanks for your diligent work.  I will look for this card."

## 2020-02-24 ENCOUNTER — Telehealth: Payer: Self-pay

## 2020-02-24 ENCOUNTER — Other Ambulatory Visit: Payer: Self-pay

## 2020-02-24 ENCOUNTER — Encounter (HOSPITAL_BASED_OUTPATIENT_CLINIC_OR_DEPARTMENT_OTHER): Payer: Self-pay | Admitting: Radiation Oncology

## 2020-02-24 NOTE — Telephone Encounter (Signed)
Patient called stating that the copies of her 2 most recent scans were to be left at the check in desk on the first floor by Dr. Sondra Come. She also states the information Dr. Sondra Come was to give to her job was not received by fax.. Patient advised this RN will make a set of copies of the CT for her and will f/u on the work information.

## 2020-02-24 NOTE — Telephone Encounter (Signed)
RN called patient back and advised her that Dr. Sondra Come is off today and the work note cannot be located at this time. RN will Ask Dr. Sondra Come on 9/27 if he has sent it and if not it will be resent. Patient advised her CT scan from August will be place at the first floor desk. Patient states she does not want a written/typed report. She wants to see the actual images of her tumor. She was advised that has to come from Radiology and she needs to talk with them and Dr. Sondra Come. Patient plans to come on 9/27 for her procedure.

## 2020-02-24 NOTE — Progress Notes (Signed)
Spoke w/ via phone for pre-op interview--- PT Lab needs dos----CBCdiff, BMP               Lab results------ current ekg in epic/ chart COVID test ------ 02-25-2020 @ 1230 Arrive at ------- 0800 NPO after MN NO Solid Food.  Clear liquids from MN until--- 0700 Medications to take morning of surgery ----- Norvasc Diabetic medication ----- n/a Patient Special Instructions ----- pt verbalized understanding that she will have tandem under anesthesia and after recovery she will be taken to cancer for radiation and that will be where would to picked up, she needs name and number of the person when she checks (either a friend or her son).  Reviewed with her the process of the is procedure from beginning to end. Pre-Op special Istructions ----- n/a Patient verbalized understanding of instructions that were given at this phone interview. Patient denies shortness of breath, chest pain, fever, cough at this phone interview.

## 2020-02-25 ENCOUNTER — Other Ambulatory Visit (HOSPITAL_COMMUNITY)
Admission: RE | Admit: 2020-02-25 | Discharge: 2020-02-25 | Disposition: A | Discharge status: Home / Self Care | Payer: BC Managed Care – PPO | Source: Ambulatory Visit | Attending: Radiation Oncology | Admitting: Radiation Oncology

## 2020-02-25 DIAGNOSIS — Z01812 Encounter for preprocedural laboratory examination: Secondary | ICD-10-CM | POA: Insufficient documentation

## 2020-02-25 DIAGNOSIS — Z20822 Contact with and (suspected) exposure to covid-19: Secondary | ICD-10-CM | POA: Diagnosis not present

## 2020-02-25 LAB — SARS CORONAVIRUS 2 (TAT 6-24 HRS): SARS Coronavirus 2: NEGATIVE

## 2020-02-26 NOTE — H&P (View-Only) (Signed)
Radiation Oncology         [phone] ________________________________  History and Physical Examination  Name: Anne Harris MRN: [id number]  Date: 02/26/2020  DOB: Feb 16, 1961  JK:KXFGHWE, Vernia Buff, NP  No ref. provider found   REFERRING PHYSICIAN: No ref. provider found  DIAGNOSIS: The encounter diagnosis was Cancer of cervix ([place]).   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 female who is seen as a courtesy of Dr. Berline Lopes for an opinion concerning radiation therapy as part of management for her recently diagnosed cervical cancer. The patient presented to the ED on 10/25/2019 with complaints of postmenopausal vaginal bleeding and abdominal pain. At that time, the patient was noted to have a mild amount of bleeding in the vaginal vault with some bleeding from the cervix. The tissue on the cervix was also noted to be abnormal in appearance, the cervical os was unable to be visualized, and the uterus felt abnormal. The patient was found to be stable in the ED and was discharged home with close OB/GYN follow-up.  The patient was seen in the ED again on 11/03/2019 with complaints of abdominal pain and persistent vaginal bleeding. An ultrasound was offered during that visit, which the patient declined. She was once again discharged home with close OB/GYN follow-up.  PAP smear on 11/10/2019 was positive for high-risk HPV and showed squamous cell carcinoma.  The patient was referred to Dr. Berline Lopes and was seen in consultation on 11/21/2019. Cervical biopsy at that time showed squamous cell carcinoma. Given that there was already evidence of disease spread into the parametrial tissue, the patient was not considered to be a surgical candidate. They discussed further work-up to evaluate for evidence of locally metastatic disease followed by primary chemoradiation.  PET scan on 11/29/2019 showed a highly hypermetabolic cervical  mass with fluid distended endometrial cavity and a maximum SUV of 25.8. There was also a left adnexal lesion versus more likely fundal fibroid with a maximum SUV of 6.0. Additionally, there was low-grade activity in several abdominal lymph nodes including a borderline enlarged peripancreatic lymph node. However, there was striking activity in numerous enlarged thoracic lymph nodes, some of which had associated calcifications. It was noted to be unusual to have that degree of thoracic adenopathy from cervical cancer without having a commensurate level of abdominopelvic adenopathy. That, along with the calcifications of the lymph nodes, raised the possibility of active granulomatous process (such as sarcoidosis) as a potential cause for the striking thoracic adenopathy. Concomitant lymphoma may also have a similar appearance. Finally, there was mild pleural/sub-pleural nodularity in the lungs that merited surveillance. Given the pulmonary findings, Dr. Berline Lopes referred the patient for a bronchoscopy.  CT of abdomen and pelvis on 12/02/2019 showed a cervical soft tissue mass consistent with known malignancy. There was associated cervical stenosis/obstruction with resulting hydrometra. Additionally, there were mildly rounded sub-centimeter aortocaval and right pelvic sidewall lymph nodes and a 5.3 x 3.9 cm left fundal exophytic soft tissue suboptimally characterized but possibly a fibroid. Finally, there were small bilateral lung base subpleural nodules that were similar in appearance.  The patient's case was discussed at the Gynecologic Oncology Multi-disciplinary Conference on 12/12/2019, during which time it was recommended that she proceed with definitive treatment with concurrent chemoradiation. She was also referred to pulmonary to discuss biopsy of lung lesions.  The patient was seen in consultation with Dr. Alvy Bimler on 12/14/2019. The plan is for weekly Cisplatin x5  weeks along with radiation  treatment.  The patient underwent a video bronchoscopy with endobronchial ultrasound on 12/15/2019 that was performed by Dr. Valeta Harms. Pathology is not consistent with malignancy.  Of note, the patient had undergone a bilateral screening mammogram on 11/10/2019 that showed a possible asymmetry in the left breast. Unilateral diagnostic mammogram and left breast ultrasound on 11/23/2019 showed probable benign left breast focal asymmetries scattered throughout the upper outer quadrant. Six-month follow-up mammogram of the left breast was recommended.  The patient recently completed external beam and radiosensitizing chemotherapy. She was unsure whether she would proceed with brachytherapy as recommended in light of her significant fatigue. After I repeated a CT scan in our department showing significant tumor shrinkage,   she has decided to proceed with high dose rate radiation therapy x 5. Brachytherapy Treatments have been delayed per her request.     PAST MEDICAL HISTORY:  Past Medical History:  Diagnosis Date  . Abnormal Pap smear of cervix   . Anemia   . Cervical cancer [facility]) oncologist-- dr Alvy Bimler and dr Sondra Come   dx 05/ 2021, SCC cervical cancer, Stage 2B,  completed chemo 01-27-2020  and completed extenal beam radiation 02-09-2020  . Hypertension    followed by pcp  . Mediastinal adenopathy   . Obesity, Class III, BMI 40-49.9 (morbid obesity) ([place])   . Pancytopenia, acquired ([place])   . Pulmonary nodules    bilateral  . Wears glasses     PAST SURGICAL HISTORY: Past Surgical History:  Procedure Laterality Date  . BRONCHIAL BIOPSY  12/15/2019   Procedure: BRONCHIAL BIOPSIES;  Surgeon: Garner Nash, DO;  Location: [place] [facility];  Service: Pulmonary;;  . BRONCHIAL NEEDLE ASPIRATION BIOPSY  12/15/2019   Procedure: BRONCHIAL NEEDLE ASPIRATION BIOPSIES;  Surgeon: Garner Nash, DO;  Location: [place] [facility];  Service: Pulmonary;;  . BRONCHIAL WASHINGS  12/15/2019   Procedure: BRONCHIAL  WASHINGS;  Surgeon: Garner Nash, DO;  Location: [place];  Service: Pulmonary;;  . CERVICAL BIOPSY    . ENDOBRONCHIAL ULTRASOUND N/A 12/15/2019   Procedure: ENDOBRONCHIAL ULTRASOUND;  Surgeon: Garner Nash, DO;  Location: [place];  Service: Pulmonary;  Laterality: N/A;  . IR IMAGING GUIDED PORT INSERTION  12/22/2019    FAMILY HISTORY:  Family History  Problem Relation Age of Onset  . Colon cancer Mother 44  . Ovarian cancer Neg Hx   . Uterine cancer Neg Hx   . Breast cancer Neg Hx   . Cervical cancer Neg Hx     SOCIAL HISTORY:  Social History   Tobacco Use  . Smoking status: Never Smoker  . Smokeless tobacco: Never Used  Vaping Use  . Vaping Use: Never used  Substance Use Topics  . Alcohol use: Yes    Comment: social  . Drug use: Never    ALLERGIES:  Allergies  Allergen Reactions  . Nsaids     Not recommended per oncology due to HTN and potential for ARI    MEDICATIONS:  No current facility-administered medications for this encounter.   Current Outpatient Medications  Medication Sig Dispense Refill  . acetaminophen (TYLENOL) 500 MG tablet Take 1,000 mg by mouth every 6 (six) hours as needed for moderate pain.    Marland Kitchen amLODipine (NORVASC) 5 MG tablet Take 1 tablet (5 mg total) by mouth daily. 90 tablet 1  . diphenoxylate-atropine (LOMOTIL) 2.5-0.025 MG tablet Take 1 tablet by mouth 4 (four) times daily as needed for diarrhea or loose stools. 60 tablet 0  . HYDROcodone-acetaminophen (  NORCO/VICODIN) 5-325 MG tablet Take 1 tablet by mouth every 12 (twelve) hours as needed. 60 tablet 0  . loperamide (IMODIUM) 2 MG capsule Take by mouth as needed for diarrhea or loose stools.    . ondansetron (ZOFRAN ODT) 4 MG disintegrating tablet Take 1 tablet (4 mg total) by mouth every 8 (eight) hours as needed for nausea or vomiting. 20 tablet 0  . ondansetron (ZOFRAN) 8 MG tablet Take 1 tablet (8 mg total) by mouth every 8 (eight) hours as needed. Start on the third day  after chemotherapy. 30 tablet 1  . prochlorperazine (COMPAZINE) 10 MG tablet Take 1 tablet (10 mg total) by mouth every 6 (six) hours as needed (Nausea or vomiting). 30 tablet 1  . lidocaine-prilocaine (EMLA) cream Apply to affected area once 30 g 3    REVIEW OF SYSTEMS:  A 10+ POINT REVIEW OF SYSTEMS WAS OBTAINED including neurology, dermatology, psychiatry, cardiac, respiratory, lymph, extremities, GI, GU, musculoskeletal, constitutional, reproductive, HEENT.  Vaginal bleeding has stopped  She denies any significant pelvic pain low back or flank pain. Fatigue is better.   PHYSICAL EXAM:  height is 5\' 4"  (1.626 m) and weight is 223 lb 15.8 oz (101.6 kg).   General: Alert and oriented, in no acute distress HEENT: Head is normocephalic. Extraocular movements are intact. Oropharynx is clear. Neck: Neck is supple, no palpable cervical or supraclavicular lymphadenopathy. Heart: Regular in rate and rhythm with no murmurs, rubs, or gallops. Chest: Clear to auscultation bilaterally, with no rhonchi, wheezes, or rales. Abdomen: Soft, nontender, nondistended, with no rigidity or guarding. Extremities: No cyanosis or edema. Lymphatics: see Neck Exam Skin: No concerning lesions. Musculoskeletal: symmetric strength and muscle tone throughout. Neurologic: Cranial nerves II through XII are grossly intact. No obvious focalities. Speech is fluent. Coordination is intact. Psychiatric: Judgment and insight are intact. Affect is appropriate. On pelvic examination the external genitalia were unremarkable. A speculum exam was performed.  Cervix was noted to be essentially replaced by tumor.  The cervical mass was estimated to be approximately 4 to 5 cm in size.  Was difficult to determine parametrial involvement on exam today.  The cervical mass bled easily on exam and extended approximately one third into the upper vaginal vault.  Rectal exam shows normal sphincter tone and confirms vaginal exam.  ECOG = 1  0 -  Asymptomatic (Fully active, able to carry on all predisease activities without restriction)  1 - Symptomatic but completely ambulatory (Restricted in physically strenuous activity but ambulatory and able to carry out work of a light or sedentary nature. For example, light housework, office work)  2 - Symptomatic, <50% in bed during the day (Ambulatory and capable of all self care but unable to carry out any work activities. Up and about more than 50% of waking hours)  3 - Symptomatic, >50% in bed, but not bedbound (Capable of only limited self-care, confined to bed or chair 50% or more of waking hours)  4 - Bedbound (Completely disabled. Cannot carry on any self-care. Totally confined to bed or chair)  5 - Death   Eustace Pen MM, Creech RH, Tormey DC, et al. [phone]). "Toxicity and response criteria of the [facility]". [place] Oncol. 5 (6): 649-55  LABORATORY DATA:  Lab Results  Component Value Date   WBC 2.8 (L) 01/31/2020   HGB 9.1 (L) 01/31/2020   HCT 29.5 (L) 01/31/2020   MCV 77.2 (L) 01/31/2020   PLT 155 01/31/2020   NEUTROABS 2.4 01/31/2020  Lab Results  Component Value Date   NA 139 01/31/2020   K 3.4 (L) 01/31/2020   CL 103 01/31/2020   CO2 28 01/31/2020   GLUCOSE 95 01/31/2020   CREATININE 0.78 01/31/2020   CALCIUM 9.4 01/31/2020      RADIOGRAPHY: No results found.    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  Radiation therapy would consist of approximately 6 weeks of external beam radiation therapy followed by 5 intracavitary high-dose-rate brachytherapy treatments.    PLAN: the patient is now ready to proceed with brachytherapy. She will be taken to the operating room September 27th for exam under anesthesia and placement of tandem/ring for high dose  rate radiation therapy with Iridium 192 as the high dose rate source. Plan is for a total of 5 brachytherapy procedures      ------------------------------------------------  Blair Promise, PhD, MD  This document serves as a record of services personally performed by Gery Pray, MD. It was created on his behalf by Clerance Lav, a trained medical scribe. The creation of this record is based on the scribe's personal observations and the provider's statements to them. This document has been checked and approved by the attending provider.

## 2020-02-26 NOTE — H&P (Signed)
Radiation Oncology         [phone] ________________________________  History and Physical Examination  Name: Anne Harris MRN: [id number]  Date: 02/26/2020  DOB: 27-Oct-1960  ZY:[id number], Vernia Buff, NP  No ref. provider found   REFERRING PHYSICIAN: No ref. provider found  DIAGNOSIS: The encounter diagnosis was Cancer of cervix ([place]).   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 female who is seen as a courtesy of Dr. Berline Lopes for an opinion concerning radiation therapy as part of management for her recently diagnosed cervical cancer. The patient presented to the ED on 10/25/2019 with complaints of postmenopausal vaginal bleeding and abdominal pain. At that time, the patient was noted to have a mild amount of bleeding in the vaginal vault with some bleeding from the cervix. The tissue on the cervix was also noted to be abnormal in appearance, the cervical os was unable to be visualized, and the uterus felt abnormal. The patient was found to be stable in the ED and was discharged home with close OB/GYN follow-up.  The patient was seen in the ED again on 11/03/2019 with complaints of abdominal pain and persistent vaginal bleeding. An ultrasound was offered during that visit, which the patient declined. She was once again discharged home with close OB/GYN follow-up.  PAP smear on 11/10/2019 was positive for high-risk HPV and showed squamous cell carcinoma.  The patient was referred to Dr. Berline Lopes and was seen in consultation on 11/21/2019. Cervical biopsy at that time showed squamous cell carcinoma. Given that there was already evidence of disease spread into the parametrial tissue, the patient was not considered to be a surgical candidate. They discussed further work-up to evaluate for evidence of locally metastatic disease followed by primary chemoradiation.  PET scan on 11/29/2019 showed a highly hypermetabolic cervical  mass with fluid distended endometrial cavity and a maximum SUV of 25.8. There was also a left adnexal lesion versus more likely fundal fibroid with a maximum SUV of 6.0. Additionally, there was low-grade activity in several abdominal lymph nodes including a borderline enlarged peripancreatic lymph node. However, there was striking activity in numerous enlarged thoracic lymph nodes, some of which had associated calcifications. It was noted to be unusual to have that degree of thoracic adenopathy from cervical cancer without having a commensurate level of abdominopelvic adenopathy. That, along with the calcifications of the lymph nodes, raised the possibility of active granulomatous process (such as sarcoidosis) as a potential cause for the striking thoracic adenopathy. Concomitant lymphoma may also have a similar appearance. Finally, there was mild pleural/sub-pleural nodularity in the lungs that merited surveillance. Given the pulmonary findings, Dr. Berline Lopes referred the patient for a bronchoscopy.  CT of abdomen and pelvis on 12/02/2019 showed a cervical soft tissue mass consistent with known malignancy. There was associated cervical stenosis/obstruction with resulting hydrometra. Additionally, there were mildly rounded sub-centimeter aortocaval and right pelvic sidewall lymph nodes and a 5.3 x 3.9 cm left fundal exophytic soft tissue suboptimally characterized but possibly a fibroid. Finally, there were small bilateral lung base subpleural nodules that were similar in appearance.  The patient's case was discussed at the Gynecologic Oncology Multi-disciplinary Conference on 12/12/2019, during which time it was recommended that she proceed with definitive treatment with concurrent chemoradiation. She was also referred to pulmonary to discuss biopsy of lung lesions.  The patient was seen in consultation with Dr. Alvy Bimler on 12/14/2019. The plan is for weekly Cisplatin x5  weeks along with radiation  treatment.  The patient underwent a video bronchoscopy with endobronchial ultrasound on 12/15/2019 that was performed by Dr. Valeta Harms. Pathology is not consistent with malignancy.  Of note, the patient had undergone a bilateral screening mammogram on 11/10/2019 that showed a possible asymmetry in the left breast. Unilateral diagnostic mammogram and left breast ultrasound on 11/23/2019 showed probable benign left breast focal asymmetries scattered throughout the upper outer quadrant. Six-month follow-up mammogram of the left breast was recommended.  The patient recently completed external beam and radiosensitizing chemotherapy. She was unsure whether she would proceed with brachytherapy as recommended in light of her significant fatigue. After I repeated a CT scan in our department showing significant tumor shrinkage,   she has decided to proceed with high dose rate radiation therapy x 5. Brachytherapy Treatments have been delayed per her request.     PAST MEDICAL HISTORY:  Past Medical History:  Diagnosis Date  . Abnormal Pap smear of cervix   . Anemia   . Cervical cancer [facility]) oncologist-- dr Alvy Bimler and dr Sondra Come   dx 05/ 2021, SCC cervical cancer, Stage 2B,  completed chemo 01-27-2020  and completed extenal beam radiation 02-09-2020  . Hypertension    followed by pcp  . Mediastinal adenopathy   . Obesity, Class III, BMI 40-49.9 (morbid obesity) ([place])   . Pancytopenia, acquired ([place])   . Pulmonary nodules    bilateral  . Wears glasses     PAST SURGICAL HISTORY: Past Surgical History:  Procedure Laterality Date  . BRONCHIAL BIOPSY  12/15/2019   Procedure: BRONCHIAL BIOPSIES;  Surgeon: Garner Nash, DO;  Location: [facility];  Service: Pulmonary;;  . BRONCHIAL NEEDLE ASPIRATION BIOPSY  12/15/2019   Procedure: BRONCHIAL NEEDLE ASPIRATION BIOPSIES;  Surgeon: Garner Nash, DO;  Location: [facility];  Service: Pulmonary;;  . BRONCHIAL WASHINGS  12/15/2019   Procedure: BRONCHIAL  WASHINGS;  Surgeon: Garner Nash, DO;  Location: [facility];  Service: Pulmonary;;  . CERVICAL BIOPSY    . ENDOBRONCHIAL ULTRASOUND N/A 12/15/2019   Procedure: ENDOBRONCHIAL ULTRASOUND;  Surgeon: Garner Nash, DO;  Location: [facility];  Service: Pulmonary;  Laterality: N/A;  . IR IMAGING GUIDED PORT INSERTION  12/22/2019    FAMILY HISTORY:  Family History  Problem Relation Age of Onset  . Colon cancer Mother 64  . Ovarian cancer Neg Hx   . Uterine cancer Neg Hx   . Breast cancer Neg Hx   . Cervical cancer Neg Hx     SOCIAL HISTORY:  Social History   Tobacco Use  . Smoking status: Never Smoker  . Smokeless tobacco: Never Used  Vaping Use  . Vaping Use: Never used  Substance Use Topics  . Alcohol use: Yes    Comment: social  . Drug use: Never    ALLERGIES:  Allergies  Allergen Reactions  . Nsaids     Not recommended per oncology due to HTN and potential for ARI    MEDICATIONS:  No current facility-administered medications for this encounter.   Current Outpatient Medications  Medication Sig Dispense Refill  . acetaminophen (TYLENOL) 500 MG tablet Take 1,000 mg by mouth every 6 (six) hours as needed for moderate pain.    Marland Kitchen amLODipine (NORVASC) 5 MG tablet Take 1 tablet (5 mg total) by mouth daily. 90 tablet 1  . diphenoxylate-atropine (LOMOTIL) 2.5-0.025 MG tablet Take 1 tablet by mouth 4 (four) times daily as needed for diarrhea or loose stools. 60 tablet 0  . HYDROcodone-acetaminophen (  NORCO/VICODIN) 5-325 MG tablet Take 1 tablet by mouth every 12 (twelve) hours as needed. 60 tablet 0  . loperamide (IMODIUM) 2 MG capsule Take by mouth as needed for diarrhea or loose stools.    . ondansetron (ZOFRAN ODT) 4 MG disintegrating tablet Take 1 tablet (4 mg total) by mouth every 8 (eight) hours as needed for nausea or vomiting. 20 tablet 0  . ondansetron (ZOFRAN) 8 MG tablet Take 1 tablet (8 mg total) by mouth every 8 (eight) hours as needed. Start on the third day  after chemotherapy. 30 tablet 1  . prochlorperazine (COMPAZINE) 10 MG tablet Take 1 tablet (10 mg total) by mouth every 6 (six) hours as needed (Nausea or vomiting). 30 tablet 1  . lidocaine-prilocaine (EMLA) cream Apply to affected area once 30 g 3    REVIEW OF SYSTEMS:  A 10+ POINT REVIEW OF SYSTEMS WAS OBTAINED including neurology, dermatology, psychiatry, cardiac, respiratory, lymph, extremities, GI, GU, musculoskeletal, constitutional, reproductive, HEENT.  Vaginal bleeding has stopped  She denies any significant pelvic pain low back or flank pain. Fatigue is better.   PHYSICAL EXAM:  height is 5\' 4"  (1.626 m) and weight is 223 lb 15.8 oz (101.6 kg).   General: Alert and oriented, in no acute distress HEENT: Head is normocephalic. Extraocular movements are intact. Oropharynx is clear. Neck: Neck is supple, no palpable cervical or supraclavicular lymphadenopathy. Heart: Regular in rate and rhythm with no murmurs, rubs, or gallops. Chest: Clear to auscultation bilaterally, with no rhonchi, wheezes, or rales. Abdomen: Soft, nontender, nondistended, with no rigidity or guarding. Extremities: No cyanosis or edema. Lymphatics: see Neck Exam Skin: No concerning lesions. Musculoskeletal: symmetric strength and muscle tone throughout. Neurologic: Cranial nerves II through XII are grossly intact. No obvious focalities. Speech is fluent. Coordination is intact. Psychiatric: Judgment and insight are intact. Affect is appropriate. On pelvic examination the external genitalia were unremarkable. A speculum exam was performed.  Cervix was noted to be essentially replaced by tumor.  The cervical mass was estimated to be approximately 4 to 5 cm in size.  Was difficult to determine parametrial involvement on exam today.  The cervical mass bled easily on exam and extended approximately one third into the upper vaginal vault.  Rectal exam shows normal sphincter tone and confirms vaginal exam.  ECOG = 1  0 -  Asymptomatic (Fully active, able to carry on all predisease activities without restriction)  1 - Symptomatic but completely ambulatory (Restricted in physically strenuous activity but ambulatory and able to carry out work of a light or sedentary nature. For example, light housework, office work)  2 - Symptomatic, <50% in bed during the day (Ambulatory and capable of all self care but unable to carry out any work activities. Up and about more than 50% of waking hours)  3 - Symptomatic, >50% in bed, but not bedbound (Capable of only limited self-care, confined to bed or chair 50% or more of waking hours)  4 - Bedbound (Completely disabled. Cannot carry on any self-care. Totally confined to bed or chair)  5 - Death   Eustace Pen MM, Creech RH, Tormey DC, et al. [phone]). "Toxicity and response criteria of the [facility]". [place] Oncol. 5 (6): 649-55  LABORATORY DATA:  Lab Results  Component Value Date   WBC 2.8 (L) 01/31/2020   HGB 9.1 (L) 01/31/2020   HCT 29.5 (L) 01/31/2020   MCV 77.2 (L) 01/31/2020   PLT 155 01/31/2020   NEUTROABS 2.4 01/31/2020  Lab Results  Component Value Date   NA 139 01/31/2020   K 3.4 (L) 01/31/2020   CL 103 01/31/2020   CO2 28 01/31/2020   GLUCOSE 95 01/31/2020   CREATININE 0.78 01/31/2020   CALCIUM 9.4 01/31/2020      RADIOGRAPHY: No results found.    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  Radiation therapy would consist of approximately 6 weeks of external beam radiation therapy followed by 5 intracavitary high-dose-rate brachytherapy treatments.    PLAN: the patient is now ready to proceed with brachytherapy. She will be taken to the operating room September 27th for exam under anesthesia and placement of tandem/ring for high dose  rate radiation therapy with Iridium 192 as the high dose rate source. Plan is for a total of 5 brachytherapy procedures      ------------------------------------------------  Blair Promise, PhD, MD  This document serves as a record of services personally performed by Gery Pray, MD. It was created on his behalf by Clerance Lav, a trained medical scribe. The creation of this record is based on the scribe's personal observations and the provider's statements to them. This document has been checked and approved by the attending provider.

## 2020-02-26 NOTE — H&P (View-Only) (Signed)
Radiation Oncology         [phone] ________________________________  History and Physical Examination  Name: Anne Harris MRN: [id number]  Date: 02/26/2020  DOB: 03-22-1961  VV:OHYWVPX, Anne Buff, NP  No ref. provider found   REFERRING PHYSICIAN: No ref. provider found  DIAGNOSIS: The encounter diagnosis was Cancer of cervix ([place]).   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 female who is seen as a courtesy of Dr. Berline Lopes for an opinion concerning radiation therapy as part of management for her recently diagnosed cervical cancer. The patient presented to the ED on 10/25/2019 with complaints of postmenopausal vaginal bleeding and abdominal pain. At that time, the patient was noted to have a mild amount of bleeding in the vaginal vault with some bleeding from the cervix. The tissue on the cervix was also noted to be abnormal in appearance, the cervical os was unable to be visualized, and the uterus felt abnormal. The patient was found to be stable in the ED and was discharged home with close OB/GYN follow-up.  The patient was seen in the ED again on 11/03/2019 with complaints of abdominal pain and persistent vaginal bleeding. An ultrasound was offered during that visit, which the patient declined. She was once again discharged home with close OB/GYN follow-up.  PAP smear on 11/10/2019 was positive for high-risk HPV and showed squamous cell carcinoma.  The patient was referred to Dr. Berline Lopes and was seen in consultation on 11/21/2019. Cervical biopsy at that time showed squamous cell carcinoma. Given that there was already evidence of disease spread into the parametrial tissue, the patient was not considered to be a surgical candidate. They discussed further work-up to evaluate for evidence of locally metastatic disease followed by primary chemoradiation.  PET scan on 11/29/2019 showed a highly hypermetabolic cervical  mass with fluid distended endometrial cavity and a maximum SUV of 25.8. There was also a left adnexal lesion versus more likely fundal fibroid with a maximum SUV of 6.0. Additionally, there was low-grade activity in several abdominal lymph nodes including a borderline enlarged peripancreatic lymph node. However, there was striking activity in numerous enlarged thoracic lymph nodes, some of which had associated calcifications. It was noted to be unusual to have that degree of thoracic adenopathy from cervical cancer without having a commensurate level of abdominopelvic adenopathy. That, along with the calcifications of the lymph nodes, raised the possibility of active granulomatous process (such as sarcoidosis) as a potential cause for the striking thoracic adenopathy. Concomitant lymphoma may also have a similar appearance. Finally, there was mild pleural/sub-pleural nodularity in the lungs that merited surveillance. Given the pulmonary findings, Dr. Berline Lopes referred the patient for a bronchoscopy.  CT of abdomen and pelvis on 12/02/2019 showed a cervical soft tissue mass consistent with known malignancy. There was associated cervical stenosis/obstruction with resulting hydrometra. Additionally, there were mildly rounded sub-centimeter aortocaval and right pelvic sidewall lymph nodes and a 5.3 x 3.9 cm left fundal exophytic soft tissue suboptimally characterized but possibly a fibroid. Finally, there were small bilateral lung base subpleural nodules that were similar in appearance.  The patient's case was discussed at the Gynecologic Oncology Multi-disciplinary Conference on 12/12/2019, during which time it was recommended that she proceed with definitive treatment with concurrent chemoradiation. She was also referred to pulmonary to discuss biopsy of lung lesions.  The patient was seen in consultation with Dr. Alvy Bimler on 12/14/2019. The plan is for weekly Cisplatin x5  weeks along with radiation  treatment.  The patient underwent a video bronchoscopy with endobronchial ultrasound on 12/15/2019 that was performed by Dr. Valeta Harms. Pathology is not consistent with malignancy.  Of note, the patient had undergone a bilateral screening mammogram on 11/10/2019 that showed a possible asymmetry in the left breast. Unilateral diagnostic mammogram and left breast ultrasound on 11/23/2019 showed probable benign left breast focal asymmetries scattered throughout the upper outer quadrant. Six-month follow-up mammogram of the left breast was recommended.  The patient recently completed external beam and radiosensitizing chemotherapy. She was unsure whether she would proceed with brachytherapy as recommended in light of her significant fatigue. After I repeated a CT scan in our department showing significant tumor shrinkage,   she has decided to proceed with high dose rate radiation therapy x 5. Brachytherapy Treatments have been delayed per her request.     PAST MEDICAL HISTORY:  Past Medical History:  Diagnosis Date  . Abnormal Pap smear of cervix   . Anemia   . Cervical cancer [facility]) oncologist-- dr Alvy Bimler and dr Sondra Come   dx 05/ 2021, SCC cervical cancer, Stage 2B,  completed chemo 01-27-2020  and completed extenal beam radiation 02-09-2020  . Hypertension    followed by pcp  . Mediastinal adenopathy   . Obesity, Class III, BMI 40-49.9 (morbid obesity) ([place])   . Pancytopenia, acquired ([place])   . Pulmonary nodules    bilateral  . Wears glasses     PAST SURGICAL HISTORY: Past Surgical History:  Procedure Laterality Date  . BRONCHIAL BIOPSY  12/15/2019   Procedure: BRONCHIAL BIOPSIES;  Surgeon: Garner Nash, DO;  Location: [place] [facility];  Service: Pulmonary;;  . BRONCHIAL NEEDLE ASPIRATION BIOPSY  12/15/2019   Procedure: BRONCHIAL NEEDLE ASPIRATION BIOPSIES;  Surgeon: Garner Nash, DO;  Location: [place] [facility];  Service: Pulmonary;;  . BRONCHIAL WASHINGS  12/15/2019   Procedure: BRONCHIAL  WASHINGS;  Surgeon: Garner Nash, DO;  Location: [place];  Service: Pulmonary;;  . CERVICAL BIOPSY    . ENDOBRONCHIAL ULTRASOUND N/A 12/15/2019   Procedure: ENDOBRONCHIAL ULTRASOUND;  Surgeon: Garner Nash, DO;  Location: [place];  Service: Pulmonary;  Laterality: N/A;  . IR IMAGING GUIDED PORT INSERTION  12/22/2019    FAMILY HISTORY:  Family History  Problem Relation Age of Onset  . Colon cancer Mother 64  . Ovarian cancer Neg Hx   . Uterine cancer Neg Hx   . Breast cancer Neg Hx   . Cervical cancer Neg Hx     SOCIAL HISTORY:  Social History   Tobacco Use  . Smoking status: Never Smoker  . Smokeless tobacco: Never Used  Vaping Use  . Vaping Use: Never used  Substance Use Topics  . Alcohol use: Yes    Comment: social  . Drug use: Never    ALLERGIES:  Allergies  Allergen Reactions  . Nsaids     Not recommended per oncology due to HTN and potential for ARI    MEDICATIONS:  No current facility-administered medications for this encounter.   Current Outpatient Medications  Medication Sig Dispense Refill  . acetaminophen (TYLENOL) 500 MG tablet Take 1,000 mg by mouth every 6 (six) hours as needed for moderate pain.    Marland Kitchen amLODipine (NORVASC) 5 MG tablet Take 1 tablet (5 mg total) by mouth daily. 90 tablet 1  . diphenoxylate-atropine (LOMOTIL) 2.5-0.025 MG tablet Take 1 tablet by mouth 4 (four) times daily as needed for diarrhea or loose stools. 60 tablet 0  . HYDROcodone-acetaminophen (  NORCO/VICODIN) 5-325 MG tablet Take 1 tablet by mouth every 12 (twelve) hours as needed. 60 tablet 0  . loperamide (IMODIUM) 2 MG capsule Take by mouth as needed for diarrhea or loose stools.    . ondansetron (ZOFRAN ODT) 4 MG disintegrating tablet Take 1 tablet (4 mg total) by mouth every 8 (eight) hours as needed for nausea or vomiting. 20 tablet 0  . ondansetron (ZOFRAN) 8 MG tablet Take 1 tablet (8 mg total) by mouth every 8 (eight) hours as needed. Start on the third day  after chemotherapy. 30 tablet 1  . prochlorperazine (COMPAZINE) 10 MG tablet Take 1 tablet (10 mg total) by mouth every 6 (six) hours as needed (Nausea or vomiting). 30 tablet 1  . lidocaine-prilocaine (EMLA) cream Apply to affected area once 30 g 3    REVIEW OF SYSTEMS:  A 10+ POINT REVIEW OF SYSTEMS WAS OBTAINED including neurology, dermatology, psychiatry, cardiac, respiratory, lymph, extremities, GI, GU, musculoskeletal, constitutional, reproductive, HEENT.  Vaginal bleeding has stopped  She denies any significant pelvic pain low back or flank pain. Fatigue is better.   PHYSICAL EXAM:  height is 5\' 4"  (1.626 m) and weight is 223 lb 15.8 oz (101.6 kg).   General: Alert and oriented, in no acute distress HEENT: Head is normocephalic. Extraocular movements are intact. Oropharynx is clear. Neck: Neck is supple, no palpable cervical or supraclavicular lymphadenopathy. Heart: Regular in rate and rhythm with no murmurs, rubs, or gallops. Chest: Clear to auscultation bilaterally, with no rhonchi, wheezes, or rales. Abdomen: Soft, nontender, nondistended, with no rigidity or guarding. Extremities: No cyanosis or edema. Lymphatics: see Neck Exam Skin: No concerning lesions. Musculoskeletal: symmetric strength and muscle tone throughout. Neurologic: Cranial nerves II through XII are grossly intact. No obvious focalities. Speech is fluent. Coordination is intact. Psychiatric: Judgment and insight are intact. Affect is appropriate. On pelvic examination the external genitalia were unremarkable. A speculum exam was performed.  Cervix was noted to be essentially replaced by tumor.  The cervical mass was estimated to be approximately 4 to 5 cm in size.  Was difficult to determine parametrial involvement on exam today.  The cervical mass bled easily on exam and extended approximately one third into the upper vaginal vault.  Rectal exam shows normal sphincter tone and confirms vaginal exam.  ECOG = 1  0 -  Asymptomatic (Fully active, able to carry on all predisease activities without restriction)  1 - Symptomatic but completely ambulatory (Restricted in physically strenuous activity but ambulatory and able to carry out work of a light or sedentary nature. For example, light housework, office work)  2 - Symptomatic, <50% in bed during the day (Ambulatory and capable of all self care but unable to carry out any work activities. Up and about more than 50% of waking hours)  3 - Symptomatic, >50% in bed, but not bedbound (Capable of only limited self-care, confined to bed or chair 50% or more of waking hours)  4 - Bedbound (Completely disabled. Cannot carry on any self-care. Totally confined to bed or chair)  5 - Death   Eustace Pen MM, Creech RH, Tormey DC, et al. [phone]). "Toxicity and response criteria of the [facility]". [place] Oncol. 5 (6): 649-55  LABORATORY DATA:  Lab Results  Component Value Date   WBC 2.8 (L) 01/31/2020   HGB 9.1 (L) 01/31/2020   HCT 29.5 (L) 01/31/2020   MCV 77.2 (L) 01/31/2020   PLT 155 01/31/2020   NEUTROABS 2.4 01/31/2020  Lab Results  Component Value Date   NA 139 01/31/2020   K 3.4 (L) 01/31/2020   CL 103 01/31/2020   CO2 28 01/31/2020   GLUCOSE 95 01/31/2020   CREATININE 0.78 01/31/2020   CALCIUM 9.4 01/31/2020      RADIOGRAPHY: No results found.    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  Radiation therapy would consist of approximately 6 weeks of external beam radiation therapy followed by 5 intracavitary high-dose-rate brachytherapy treatments.    PLAN: the patient is now ready to proceed with brachytherapy. She will be taken to the operating room September 27th for exam under anesthesia and placement of tandem/ring for high dose  rate radiation therapy with Iridium 192 as the high dose rate source. Plan is for a total of 5 brachytherapy procedures      ------------------------------------------------  Blair Promise, PhD, MD  This document serves as a record of services personally performed by Gery Pray, MD. It was created on his behalf by Clerance Lav, a trained medical scribe. The creation of this record is based on the scribe's personal observations and the provider's statements to them. This document has been checked and approved by the attending provider.

## 2020-02-26 NOTE — H&P (View-Only) (Signed)
Radiation Oncology         [phone] ________________________________  History and Physical Examination  Name: Anne Harris MRN: [id number]  Date: 02/26/2020  DOB: 1960/07/09  [id number], Anne Buff, NP  No ref. provider found   REFERRING PHYSICIAN: No ref. provider found  DIAGNOSIS: The encounter diagnosis was Cancer of cervix ([place]).   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 female who is seen as a courtesy of Dr. Berline Lopes for an opinion concerning radiation therapy as part of management for her recently diagnosed cervical cancer. The patient presented to the ED on 10/25/2019 with complaints of postmenopausal vaginal bleeding and abdominal pain. At that time, the patient was noted to have a mild amount of bleeding in the vaginal vault with some bleeding from the cervix. The tissue on the cervix was also noted to be abnormal in appearance, the cervical os was unable to be visualized, and the uterus felt abnormal. The patient was found to be stable in the ED and was discharged home with close OB/GYN follow-up.  The patient was seen in the ED again on 11/03/2019 with complaints of abdominal pain and persistent vaginal bleeding. An ultrasound was offered during that visit, which the patient declined. She was once again discharged home with close OB/GYN follow-up.  PAP smear on 11/10/2019 was positive for high-risk HPV and showed squamous cell carcinoma.  The patient was referred to Dr. Berline Lopes and was seen in consultation on 11/21/2019. Cervical biopsy at that time showed squamous cell carcinoma. Given that there was already evidence of disease spread into the parametrial tissue, the patient was not considered to be a surgical candidate. They discussed further work-up to evaluate for evidence of locally metastatic disease followed by primary chemoradiation.  PET scan on 11/29/2019 showed a highly hypermetabolic cervical  mass with fluid distended endometrial cavity and a maximum SUV of 25.8. There was also a left adnexal lesion versus more likely fundal fibroid with a maximum SUV of 6.0. Additionally, there was low-grade activity in several abdominal lymph nodes including a borderline enlarged peripancreatic lymph node. However, there was striking activity in numerous enlarged thoracic lymph nodes, some of which had associated calcifications. It was noted to be unusual to have that degree of thoracic adenopathy from cervical cancer without having a commensurate level of abdominopelvic adenopathy. That, along with the calcifications of the lymph nodes, raised the possibility of active granulomatous process (such as sarcoidosis) as a potential cause for the striking thoracic adenopathy. Concomitant lymphoma may also have a similar appearance. Finally, there was mild pleural/sub-pleural nodularity in the lungs that merited surveillance. Given the pulmonary findings, Dr. Berline Lopes referred the patient for a bronchoscopy.  CT of abdomen and pelvis on 12/02/2019 showed a cervical soft tissue mass consistent with known malignancy. There was associated cervical stenosis/obstruction with resulting hydrometra. Additionally, there were mildly rounded sub-centimeter aortocaval and right pelvic sidewall lymph nodes and a 5.3 x 3.9 cm left fundal exophytic soft tissue suboptimally characterized but possibly a fibroid. Finally, there were small bilateral lung base subpleural nodules that were similar in appearance.  The patient's case was discussed at the Gynecologic Oncology Multi-disciplinary Conference on 12/12/2019, during which time it was recommended that she proceed with definitive treatment with concurrent chemoradiation. She was also referred to pulmonary to discuss biopsy of lung lesions.  The patient was seen in consultation with Dr. Alvy Bimler on 12/14/2019. The plan is for weekly Cisplatin x5  weeks along with radiation  treatment.  The patient underwent a video bronchoscopy with endobronchial ultrasound on 12/15/2019 that was performed by Dr. Valeta Harms. Pathology is not consistent with malignancy.  Of note, the patient had undergone a bilateral screening mammogram on 11/10/2019 that showed a possible asymmetry in the left breast. Unilateral diagnostic mammogram and left breast ultrasound on 11/23/2019 showed probable benign left breast focal asymmetries scattered throughout the upper outer quadrant. Six-month follow-up mammogram of the left breast was recommended.  The patient recently completed external beam and radiosensitizing chemotherapy. She was unsure whether she would proceed with brachytherapy as recommended in light of her significant fatigue. After I repeated a CT scan in our department showing significant tumor shrinkage,   she has decided to proceed with high dose rate radiation therapy x 5. Brachytherapy Treatments have been delayed per her request.     PAST MEDICAL HISTORY:  Past Medical History:  Diagnosis Date  . Abnormal Pap smear of cervix   . Anemia   . Cervical cancer [facility]) oncologist-- dr Alvy Bimler and dr Sondra Come   dx 05/ 2021, SCC cervical cancer, Stage 2B,  completed chemo 01-27-2020  and completed extenal beam radiation 02-09-2020  . Hypertension    followed by pcp  . Mediastinal adenopathy   . Obesity, Class III, BMI 40-49.9 (morbid obesity) ([place])   . Pancytopenia, acquired ([place])   . Pulmonary nodules    bilateral  . Wears glasses     PAST SURGICAL HISTORY: Past Surgical History:  Procedure Laterality Date  . BRONCHIAL BIOPSY  12/15/2019   Procedure: BRONCHIAL BIOPSIES;  Surgeon: Garner Nash, DO;  Location: [place] [facility];  Service: Pulmonary;;  . BRONCHIAL NEEDLE ASPIRATION BIOPSY  12/15/2019   Procedure: BRONCHIAL NEEDLE ASPIRATION BIOPSIES;  Surgeon: Garner Nash, DO;  Location: [place] [facility];  Service: Pulmonary;;  . BRONCHIAL WASHINGS  12/15/2019   Procedure: BRONCHIAL  WASHINGS;  Surgeon: Garner Nash, DO;  Location: [place];  Service: Pulmonary;;  . CERVICAL BIOPSY    . ENDOBRONCHIAL ULTRASOUND N/A 12/15/2019   Procedure: ENDOBRONCHIAL ULTRASOUND;  Surgeon: Garner Nash, DO;  Location: [place];  Service: Pulmonary;  Laterality: N/A;  . IR IMAGING GUIDED PORT INSERTION  12/22/2019    FAMILY HISTORY:  Family History  Problem Relation Age of Onset  . Colon cancer Mother 56  . Ovarian cancer Neg Hx   . Uterine cancer Neg Hx   . Breast cancer Neg Hx   . Cervical cancer Neg Hx     SOCIAL HISTORY:  Social History   Tobacco Use  . Smoking status: Never Smoker  . Smokeless tobacco: Never Used  Vaping Use  . Vaping Use: Never used  Substance Use Topics  . Alcohol use: Yes    Comment: social  . Drug use: Never    ALLERGIES:  Allergies  Allergen Reactions  . Nsaids     Not recommended per oncology due to HTN and potential for ARI    MEDICATIONS:  No current facility-administered medications for this encounter.   Current Outpatient Medications  Medication Sig Dispense Refill  . acetaminophen (TYLENOL) 500 MG tablet Take 1,000 mg by mouth every 6 (six) hours as needed for moderate pain.    Marland Kitchen amLODipine (NORVASC) 5 MG tablet Take 1 tablet (5 mg total) by mouth daily. 90 tablet 1  . diphenoxylate-atropine (LOMOTIL) 2.5-0.025 MG tablet Take 1 tablet by mouth 4 (four) times daily as needed for diarrhea or loose stools. 60 tablet 0  . HYDROcodone-acetaminophen (  NORCO/VICODIN) 5-325 MG tablet Take 1 tablet by mouth every 12 (twelve) hours as needed. 60 tablet 0  . loperamide (IMODIUM) 2 MG capsule Take by mouth as needed for diarrhea or loose stools.    . ondansetron (ZOFRAN ODT) 4 MG disintegrating tablet Take 1 tablet (4 mg total) by mouth every 8 (eight) hours as needed for nausea or vomiting. 20 tablet 0  . ondansetron (ZOFRAN) 8 MG tablet Take 1 tablet (8 mg total) by mouth every 8 (eight) hours as needed. Start on the third day  after chemotherapy. 30 tablet 1  . prochlorperazine (COMPAZINE) 10 MG tablet Take 1 tablet (10 mg total) by mouth every 6 (six) hours as needed (Nausea or vomiting). 30 tablet 1  . lidocaine-prilocaine (EMLA) cream Apply to affected area once 30 g 3    REVIEW OF SYSTEMS:  A 10+ POINT REVIEW OF SYSTEMS WAS OBTAINED including neurology, dermatology, psychiatry, cardiac, respiratory, lymph, extremities, GI, GU, musculoskeletal, constitutional, reproductive, HEENT.  Vaginal bleeding has stopped  She denies any significant pelvic pain low back or flank pain. Fatigue is better.   PHYSICAL EXAM:  height is 5\' 4"  (1.626 m) and weight is 223 lb 15.8 oz (101.6 kg).   General: Alert and oriented, in no acute distress HEENT: Head is normocephalic. Extraocular movements are intact. Oropharynx is clear. Neck: Neck is supple, no palpable cervical or supraclavicular lymphadenopathy. Heart: Regular in rate and rhythm with no murmurs, rubs, or gallops. Chest: Clear to auscultation bilaterally, with no rhonchi, wheezes, or rales. Abdomen: Soft, nontender, nondistended, with no rigidity or guarding. Extremities: No cyanosis or edema. Lymphatics: see Neck Exam Skin: No concerning lesions. Musculoskeletal: symmetric strength and muscle tone throughout. Neurologic: Cranial nerves II through XII are grossly intact. No obvious focalities. Speech is fluent. Coordination is intact. Psychiatric: Judgment and insight are intact. Affect is appropriate. On pelvic examination the external genitalia were unremarkable. A speculum exam was performed.  Cervix was noted to be essentially replaced by tumor.  The cervical mass was estimated to be approximately 4 to 5 cm in size.  Was difficult to determine parametrial involvement on exam today.  The cervical mass bled easily on exam and extended approximately one third into the upper vaginal vault.  Rectal exam shows normal sphincter tone and confirms vaginal exam.  ECOG = 1  0 -  Asymptomatic (Fully active, able to carry on all predisease activities without restriction)  1 - Symptomatic but completely ambulatory (Restricted in physically strenuous activity but ambulatory and able to carry out work of a light or sedentary nature. For example, light housework, office work)  2 - Symptomatic, <50% in bed during the day (Ambulatory and capable of all self care but unable to carry out any work activities. Up and about more than 50% of waking hours)  3 - Symptomatic, >50% in bed, but not bedbound (Capable of only limited self-care, confined to bed or chair 50% or more of waking hours)  4 - Bedbound (Completely disabled. Cannot carry on any self-care. Totally confined to bed or chair)  5 - Death   Eustace Pen MM, Creech RH, Tormey DC, et al. [phone]). "Toxicity and response criteria of the [facility] Group". [place] Oncol. 5 (6): 649-55  LABORATORY DATA:  Lab Results  Component Value Date   WBC 2.8 (L) 01/31/2020   HGB 9.1 (L) 01/31/2020   HCT 29.5 (L) 01/31/2020   MCV 77.2 (L) 01/31/2020   PLT 155 01/31/2020   NEUTROABS 2.4 01/31/2020  Lab Results  Component Value Date   NA 139 01/31/2020   K 3.4 (L) 01/31/2020   CL 103 01/31/2020   CO2 28 01/31/2020   GLUCOSE 95 01/31/2020   CREATININE 0.78 01/31/2020   CALCIUM 9.4 01/31/2020      RADIOGRAPHY: No results found.    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  Radiation therapy would consist of approximately 6 weeks of external beam radiation therapy followed by 5 intracavitary high-dose-rate brachytherapy treatments.    PLAN: the patient is now ready to proceed with brachytherapy. She will be taken to the operating room September 27th for exam under anesthesia and placement of tandem/ring for high dose  rate radiation therapy with Iridium 192 as the high dose rate source. Plan is for a total of 5 brachytherapy procedures      ------------------------------------------------  Blair Promise, PhD, MD  This document serves as a record of services personally performed by Gery Pray, MD. It was created on his behalf by Clerance Lav, a trained medical scribe. The creation of this record is based on the scribe's personal observations and the provider's statements to them. This document has been checked and approved by the attending provider.

## 2020-02-26 NOTE — H&P (View-Only) (Signed)
Radiation Oncology         (336) [phone] ________________________________  History and Physical Examination  Name: Anne Harris MRN: [id number]  Date: 02/26/2020  DOB: May 05, 1961  YT:KZSWFUX, Vernia Buff, NP  No ref. provider found   REFERRING PHYSICIAN: No ref. provider found  DIAGNOSIS: The encounter diagnosis was Cancer of cervix ([place]).   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 female who is seen as a courtesy of Dr. Berline Lopes for an opinion concerning radiation therapy as part of management for her recently diagnosed cervical cancer. The patient presented to the ED on 10/25/2019 with complaints of postmenopausal vaginal bleeding and abdominal pain. At that time, the patient was noted to have a mild amount of bleeding in the vaginal vault with some bleeding from the cervix. The tissue on the cervix was also noted to be abnormal in appearance, the cervical os was unable to be visualized, and the uterus felt abnormal. The patient was found to be stable in the ED and was discharged home with close OB/GYN follow-up.  The patient was seen in the ED again on 11/03/2019 with complaints of abdominal pain and persistent vaginal bleeding. An ultrasound was offered during that visit, which the patient declined. She was once again discharged home with close OB/GYN follow-up.  PAP smear on 11/10/2019 was positive for high-risk HPV and showed squamous cell carcinoma.  The patient was referred to Dr. Berline Lopes and was seen in consultation on 11/21/2019. Cervical biopsy at that time showed squamous cell carcinoma. Given that there was already evidence of disease spread into the parametrial tissue, the patient was not considered to be a surgical candidate. They discussed further work-up to evaluate for evidence of locally metastatic disease followed by primary chemoradiation.  PET scan on 11/29/2019 showed a highly hypermetabolic cervical  mass with fluid distended endometrial cavity and a maximum SUV of 25.8. There was also a left adnexal lesion versus more likely fundal fibroid with a maximum SUV of 6.0. Additionally, there was low-grade activity in several abdominal lymph nodes including a borderline enlarged peripancreatic lymph node. However, there was striking activity in numerous enlarged thoracic lymph nodes, some of which had associated calcifications. It was noted to be unusual to have that degree of thoracic adenopathy from cervical cancer without having a commensurate level of abdominopelvic adenopathy. That, along with the calcifications of the lymph nodes, raised the possibility of active granulomatous process (such as sarcoidosis) as a potential cause for the striking thoracic adenopathy. Concomitant lymphoma may also have a similar appearance. Finally, there was mild pleural/sub-pleural nodularity in the lungs that merited surveillance. Given the pulmonary findings, Dr. Berline Lopes referred the patient for a bronchoscopy.  CT of abdomen and pelvis on 12/02/2019 showed a cervical soft tissue mass consistent with known malignancy. There was associated cervical stenosis/obstruction with resulting hydrometra. Additionally, there were mildly rounded sub-centimeter aortocaval and right pelvic sidewall lymph nodes and a 5.3 x 3.9 cm left fundal exophytic soft tissue suboptimally characterized but possibly a fibroid. Finally, there were small bilateral lung base subpleural nodules that were similar in appearance.  The patient's case was discussed at the Gynecologic Oncology Multi-disciplinary Conference on 12/12/2019, during which time it was recommended that she proceed with definitive treatment with concurrent chemoradiation. She was also referred to pulmonary to discuss biopsy of lung lesions.  The patient was seen in consultation with Dr. Alvy Bimler on 12/14/2019. The plan is for weekly Cisplatin x5  weeks along with radiation  treatment.  The patient underwent a video bronchoscopy with endobronchial ultrasound on 12/15/2019 that was performed by Dr. Valeta Harms. Pathology is not consistent with malignancy.  Of note, the patient had undergone a bilateral screening mammogram on 11/10/2019 that showed a possible asymmetry in the left breast. Unilateral diagnostic mammogram and left breast ultrasound on 11/23/2019 showed probable benign left breast focal asymmetries scattered throughout the upper outer quadrant. Six-month follow-up mammogram of the left breast was recommended.  The patient recently completed external beam and radiosensitizing chemotherapy. She was unsure whether she would proceed with brachytherapy as recommended in light of her significant fatigue. After I repeated a CT scan in our department showing significant tumor shrinkage,   she has decided to proceed with high dose rate radiation therapy x 5. Brachytherapy Treatments have been delayed per her request.     PAST MEDICAL HISTORY:  Past Medical History:  Diagnosis Date  . Abnormal Pap smear of cervix   . Anemia   . Cervical cancer [facility]) oncologist-- dr Alvy Bimler and dr Sondra Come   dx 05/ 2021, SCC cervical cancer, Stage 2B,  completed chemo 01-27-2020  and completed extenal beam radiation 02-09-2020  . Hypertension    followed by pcp  . Mediastinal adenopathy   . Obesity, Class III, BMI 40-49.9 (morbid obesity) ([place])   . Pancytopenia, acquired ([place])   . Pulmonary nodules    bilateral  . Wears glasses     PAST SURGICAL HISTORY: Past Surgical History:  Procedure Laterality Date  . BRONCHIAL BIOPSY  12/15/2019   Procedure: BRONCHIAL BIOPSIES;  Surgeon: Garner Nash, DO;  Location: [facility];  Service: Pulmonary;;  . BRONCHIAL NEEDLE ASPIRATION BIOPSY  12/15/2019   Procedure: BRONCHIAL NEEDLE ASPIRATION BIOPSIES;  Surgeon: Garner Nash, DO;  Location: [facility];  Service: Pulmonary;;  . BRONCHIAL WASHINGS  12/15/2019   Procedure: BRONCHIAL  WASHINGS;  Surgeon: Garner Nash, DO;  Location: [facility];  Service: Pulmonary;;  . CERVICAL BIOPSY    . ENDOBRONCHIAL ULTRASOUND N/A 12/15/2019   Procedure: ENDOBRONCHIAL ULTRASOUND;  Surgeon: Garner Nash, DO;  Location: [facility];  Service: Pulmonary;  Laterality: N/A;  . IR IMAGING GUIDED PORT INSERTION  12/22/2019    FAMILY HISTORY:  Family History  Problem Relation Age of Onset  . Colon cancer Mother 31  . Ovarian cancer Neg Hx   . Uterine cancer Neg Hx   . Breast cancer Neg Hx   . Cervical cancer Neg Hx     SOCIAL HISTORY:  Social History   Tobacco Use  . Smoking status: Never Smoker  . Smokeless tobacco: Never Used  Vaping Use  . Vaping Use: Never used  Substance Use Topics  . Alcohol use: Yes    Comment: social  . Drug use: Never    ALLERGIES:  Allergies  Allergen Reactions  . Nsaids     Not recommended per oncology due to HTN and potential for ARI    MEDICATIONS:  No current facility-administered medications for this encounter.   Current Outpatient Medications  Medication Sig Dispense Refill  . acetaminophen (TYLENOL) 500 MG tablet Take 1,000 mg by mouth every 6 (six) hours as needed for moderate pain.    Marland Kitchen amLODipine (NORVASC) 5 MG tablet Take 1 tablet (5 mg total) by mouth daily. 90 tablet 1  . diphenoxylate-atropine (LOMOTIL) 2.5-0.025 MG tablet Take 1 tablet by mouth 4 (four) times daily as needed for diarrhea or loose stools. 60 tablet 0  . HYDROcodone-acetaminophen (  NORCO/VICODIN) 5-325 MG tablet Take 1 tablet by mouth every 12 (twelve) hours as needed. 60 tablet 0  . loperamide (IMODIUM) 2 MG capsule Take by mouth as needed for diarrhea or loose stools.    . ondansetron (ZOFRAN ODT) 4 MG disintegrating tablet Take 1 tablet (4 mg total) by mouth every 8 (eight) hours as needed for nausea or vomiting. 20 tablet 0  . ondansetron (ZOFRAN) 8 MG tablet Take 1 tablet (8 mg total) by mouth every 8 (eight) hours as needed. Start on the third day  after chemotherapy. 30 tablet 1  . prochlorperazine (COMPAZINE) 10 MG tablet Take 1 tablet (10 mg total) by mouth every 6 (six) hours as needed (Nausea or vomiting). 30 tablet 1  . lidocaine-prilocaine (EMLA) cream Apply to affected area once 30 g 3    REVIEW OF SYSTEMS:  A 10+ POINT REVIEW OF SYSTEMS WAS OBTAINED including neurology, dermatology, psychiatry, cardiac, respiratory, lymph, extremities, GI, GU, musculoskeletal, constitutional, reproductive, HEENT.  Vaginal bleeding has stopped  She denies any significant pelvic pain low back or flank pain. Fatigue is better.   PHYSICAL EXAM:  height is 5\' 4"  (1.626 m) and weight is 223 lb 15.8 oz (101.6 kg).   General: Alert and oriented, in no acute distress HEENT: Head is normocephalic. Extraocular movements are intact. Oropharynx is clear. Neck: Neck is supple, no palpable cervical or supraclavicular lymphadenopathy. Heart: Regular in rate and rhythm with no murmurs, rubs, or gallops. Chest: Clear to auscultation bilaterally, with no rhonchi, wheezes, or rales. Abdomen: Soft, nontender, nondistended, with no rigidity or guarding. Extremities: No cyanosis or edema. Lymphatics: see Neck Exam Skin: No concerning lesions. Musculoskeletal: symmetric strength and muscle tone throughout. Neurologic: Cranial nerves II through XII are grossly intact. No obvious focalities. Speech is fluent. Coordination is intact. Psychiatric: Judgment and insight are intact. Affect is appropriate. On pelvic examination the external genitalia were unremarkable. A speculum exam was performed.  Cervix was noted to be essentially replaced by tumor.  The cervical mass was estimated to be approximately 4 to 5 cm in size.  Was difficult to determine parametrial involvement on exam today.  The cervical mass bled easily on exam and extended approximately one third into the upper vaginal vault.  Rectal exam shows normal sphincter tone and confirms vaginal exam.  ECOG = 1  0 -  Asymptomatic (Fully active, able to carry on all predisease activities without restriction)  1 - Symptomatic but completely ambulatory (Restricted in physically strenuous activity but ambulatory and able to carry out work of a light or sedentary nature. For example, light housework, office work)  2 - Symptomatic, <50% in bed during the day (Ambulatory and capable of all self care but unable to carry out any work activities. Up and about more than 50% of waking hours)  3 - Symptomatic, >50% in bed, but not bedbound (Capable of only limited self-care, confined to bed or chair 50% or more of waking hours)  4 - Bedbound (Completely disabled. Cannot carry on any self-care. Totally confined to bed or chair)  5 - Death   Eustace Pen MM, Creech RH, Tormey DC, et al. [phone]). "Toxicity and response criteria of the [facility]". [place] Oncol. 5 (6): 649-55  LABORATORY DATA:  Lab Results  Component Value Date   WBC 2.8 (L) 01/31/2020   HGB 9.1 (L) 01/31/2020   HCT 29.5 (L) 01/31/2020   MCV 77.2 (L) 01/31/2020   PLT 155 01/31/2020   NEUTROABS 2.4 01/31/2020  Lab Results  Component Value Date   NA 139 01/31/2020   K 3.4 (L) 01/31/2020   CL 103 01/31/2020   CO2 28 01/31/2020   GLUCOSE 95 01/31/2020   CREATININE 0.78 01/31/2020   CALCIUM 9.4 01/31/2020      RADIOGRAPHY: No results found.    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  Radiation therapy would consist of approximately 6 weeks of external beam radiation therapy followed by 5 intracavitary high-dose-rate brachytherapy treatments.    PLAN: the patient is now ready to proceed with brachytherapy. She will be taken to the operating room September 27th for exam under anesthesia and placement of tandem/ring for high dose  rate radiation therapy with Iridium 192 as the high dose rate source. Plan is for a total of 5 brachytherapy procedures      ------------------------------------------------  Blair Promise, PhD, MD  This document serves as a record of services personally performed by Gery Pray, MD. It was created on his behalf by Clerance Lav, a trained medical scribe. The creation of this record is based on the scribe's personal observations and the provider's statements to them. This document has been checked and approved by the attending provider.

## 2020-02-27 ENCOUNTER — Encounter (HOSPITAL_BASED_OUTPATIENT_CLINIC_OR_DEPARTMENT_OTHER)
Admission: RE | Disposition: A | Discharge status: Other Acute Inpatient Hospital | Payer: Self-pay | Source: Home / Self Care | Attending: Radiation Oncology

## 2020-02-27 ENCOUNTER — Ambulatory Visit
Admission: RE | Admit: 2020-02-27 | Discharge: 2020-02-27 | Disposition: A | Discharge status: Home / Self Care | Payer: BC Managed Care – PPO | Source: Ambulatory Visit | Attending: Radiation Oncology | Admitting: Radiation Oncology

## 2020-02-27 ENCOUNTER — Ambulatory Visit (HOSPITAL_BASED_OUTPATIENT_CLINIC_OR_DEPARTMENT_OTHER)
Admission: RE | Admit: 2020-02-27 | Discharge: 2020-02-27 | Disposition: A | Discharge status: Other Acute Inpatient Hospital | Payer: BC Managed Care – PPO | Attending: Radiation Oncology | Admitting: Radiation Oncology

## 2020-02-27 ENCOUNTER — Encounter (HOSPITAL_BASED_OUTPATIENT_CLINIC_OR_DEPARTMENT_OTHER): Payer: Self-pay | Admitting: Radiation Oncology

## 2020-02-27 ENCOUNTER — Ambulatory Visit (HOSPITAL_COMMUNITY)
Admission: RE | Admit: 2020-02-27 | Discharge: 2020-02-27 | Disposition: A | Discharge status: Home / Self Care | Payer: BC Managed Care – PPO | Source: Ambulatory Visit | Attending: Radiation Oncology | Admitting: Radiation Oncology

## 2020-02-27 ENCOUNTER — Ambulatory Visit (HOSPITAL_BASED_OUTPATIENT_CLINIC_OR_DEPARTMENT_OTHER): Payer: BC Managed Care – PPO | Admitting: Anesthesiology

## 2020-02-27 ENCOUNTER — Other Ambulatory Visit: Payer: Self-pay

## 2020-02-27 DIAGNOSIS — Z79899 Other long term (current) drug therapy: Secondary | ICD-10-CM | POA: Insufficient documentation

## 2020-02-27 DIAGNOSIS — C539 Malignant neoplasm of cervix uteri, unspecified: Secondary | ICD-10-CM

## 2020-02-27 DIAGNOSIS — R918 Other nonspecific abnormal finding of lung field: Secondary | ICD-10-CM | POA: Insufficient documentation

## 2020-02-27 DIAGNOSIS — I10 Essential (primary) hypertension: Secondary | ICD-10-CM | POA: Insufficient documentation

## 2020-02-27 DIAGNOSIS — Z886 Allergy status to analgesic agent status: Secondary | ICD-10-CM | POA: Insufficient documentation

## 2020-02-27 DIAGNOSIS — Z6837 Body mass index (BMI) 37.0-37.9, adult: Secondary | ICD-10-CM | POA: Diagnosis not present

## 2020-02-27 DIAGNOSIS — R590 Localized enlarged lymph nodes: Secondary | ICD-10-CM | POA: Insufficient documentation

## 2020-02-27 HISTORY — PX: TANDEM RING INSERTION: SHX6199

## 2020-02-27 HISTORY — DX: Other nonspecific abnormal finding of lung field: R91.8

## 2020-02-27 HISTORY — DX: Other pancytopenia: D61.818

## 2020-02-27 HISTORY — DX: Anemia, unspecified: D64.9

## 2020-02-27 HISTORY — PX: OPERATIVE ULTRASOUND: SHX5996

## 2020-02-27 LAB — CBC WITH DIFFERENTIAL/PLATELET
Abs Immature Granulocytes: 0.02 10*3/uL (ref 0.00–0.07)
Basophils Absolute: 0 10*3/uL (ref 0.0–0.1)
Basophils Relative: 0 %
Eosinophils Absolute: 0 10*3/uL (ref 0.0–0.5)
Eosinophils Relative: 0 %
HCT: 35 % — ABNORMAL LOW (ref 36.0–46.0)
Hemoglobin: 10.9 g/dL — ABNORMAL LOW (ref 12.0–15.0)
Immature Granulocytes: 0 %
Lymphocytes Relative: 12 %
Lymphs Abs: 0.6 10*3/uL — ABNORMAL LOW (ref 0.7–4.0)
MCH: 25.2 pg — ABNORMAL LOW (ref 26.0–34.0)
MCHC: 31.1 g/dL (ref 30.0–36.0)
MCV: 81 fL (ref 80.0–100.0)
Monocytes Absolute: 0.3 10*3/uL (ref 0.1–1.0)
Monocytes Relative: 6 %
Neutro Abs: 3.9 10*3/uL (ref 1.7–7.7)
Neutrophils Relative %: 82 %
Platelets: 269 10*3/uL (ref 150–400)
RBC: 4.32 MIL/uL (ref 3.87–5.11)
RDW: 22.4 % — ABNORMAL HIGH (ref 11.5–15.5)
WBC: 4.7 10*3/uL (ref 4.0–10.5)
nRBC: 0 % (ref 0.0–0.2)

## 2020-02-27 LAB — BASIC METABOLIC PANEL
Anion gap: 14 (ref 5–15)
BUN: <5 mg/dL — ABNORMAL LOW (ref 6–20)
CO2: 25 mmol/L (ref 22–32)
Calcium: 9.4 mg/dL (ref 8.9–10.3)
Chloride: 105 mmol/L (ref 98–111)
Creatinine, Ser: 0.78 mg/dL (ref 0.44–1.00)
GFR calc Af Amer: >60 mL/min (ref >60)
GFR calc non Af Amer: >60 mL/min (ref >60)
Glucose, Bld: 97 mg/dL (ref 70–99)
Potassium: 3.6 mmol/L (ref 3.5–5.1)
Sodium: 144 mmol/L (ref 135–145)

## 2020-02-27 SURGERY — INSERTION, UTERINE TANDEM AND RING OR CYLINDER, FOR BRACHYTHERAPY
Anesthesia: General | Site: Vagina

## 2020-02-27 MED ORDER — FENTANYL CITRATE (PF) 100 MCG/2ML IJ SOLN
INTRAMUSCULAR | Status: DC | PRN
Start: 2020-02-27 — End: 2020-02-27
  Administered 2020-02-27: 100 ug via INTRAVENOUS
  Administered 2020-02-27 (×2): 50 ug via INTRAVENOUS

## 2020-02-27 MED ORDER — MIDAZOLAM HCL 2 MG/2ML IJ SOLN
INTRAMUSCULAR | Status: AC
  Filled 2020-02-27: qty 2

## 2020-02-27 MED ORDER — FENTANYL CITRATE (PF) 100 MCG/2ML IJ SOLN: 25.0000 ug | INTRAMUSCULAR | Status: DC | PRN

## 2020-02-27 MED ORDER — HYDROMORPHONE HCL 1 MG/ML IJ SOLN
0.5000 mg | Freq: Once | INTRAMUSCULAR | Status: AC
  Administered 2020-02-27: 0.5 mg via INTRAMUSCULAR
  Filled 2020-02-27: qty 1

## 2020-02-27 MED ORDER — ONDANSETRON HCL 4 MG/2ML IJ SOLN
INTRAMUSCULAR | Status: DC | PRN
  Administered 2020-02-27: 4 mg via INTRAVENOUS

## 2020-02-27 MED ORDER — ACETAMINOPHEN 325 MG PO TABS: 325.0000 mg | ORAL_TABLET | ORAL | Status: DC | PRN

## 2020-02-27 MED ORDER — LIDOCAINE 2% (20 MG/ML) 5 ML SYRINGE
INTRAMUSCULAR | Status: DC | PRN
  Administered 2020-02-27: 100 mg via INTRAVENOUS

## 2020-02-27 MED ORDER — KETOROLAC TROMETHAMINE 30 MG/ML IJ SOLN
INTRAMUSCULAR | Status: AC
  Filled 2020-02-27: qty 1

## 2020-02-27 MED ORDER — POVIDONE-IODINE 10 % EX SWAB: 2.0000 "application " | Freq: Once | CUTANEOUS | Status: DC

## 2020-02-27 MED ORDER — PROPOFOL 10 MG/ML IV BOLUS
INTRAVENOUS | Status: AC
  Filled 2020-02-27: qty 20

## 2020-02-27 MED ORDER — DEXAMETHASONE SODIUM PHOSPHATE 10 MG/ML IJ SOLN
INTRAMUSCULAR | Status: DC | PRN
  Administered 2020-02-27: 10 mg via INTRAVENOUS

## 2020-02-27 MED ORDER — FENTANYL CITRATE (PF) 250 MCG/5ML IJ SOLN
INTRAMUSCULAR | Status: AC
  Filled 2020-02-27: qty 5

## 2020-02-27 MED ORDER — PROPOFOL 10 MG/ML IV BOLUS
INTRAVENOUS | Status: DC | PRN
  Administered 2020-02-27: 200 mg via INTRAVENOUS

## 2020-02-27 MED ORDER — ONDANSETRON HCL 4 MG/2ML IJ SOLN
INTRAMUSCULAR | Status: AC
  Filled 2020-02-27: qty 2

## 2020-02-27 MED ORDER — OXYCODONE HCL 5 MG/5ML PO SOLN: 5.0000 mg | Freq: Once | ORAL | Status: DC | PRN

## 2020-02-27 MED ORDER — DEXAMETHASONE SODIUM PHOSPHATE 10 MG/ML IJ SOLN
INTRAMUSCULAR | Status: AC
  Filled 2020-02-27: qty 1

## 2020-02-27 MED ORDER — ACETAMINOPHEN 160 MG/5ML PO SOLN: 325.0000 mg | ORAL | Status: DC | PRN

## 2020-02-27 MED ORDER — MEPERIDINE HCL 25 MG/ML IJ SOLN: 6.2500 mg | INTRAMUSCULAR | Status: DC | PRN

## 2020-02-27 MED ORDER — LIDOCAINE 2% (20 MG/ML) 5 ML SYRINGE
INTRAMUSCULAR | Status: AC
  Filled 2020-02-27: qty 5

## 2020-02-27 MED ORDER — ONDANSETRON HCL 4 MG/2ML IJ SOLN: 4.0000 mg | Freq: Once | INTRAMUSCULAR | Status: DC | PRN

## 2020-02-27 MED ORDER — MIDAZOLAM HCL 5 MG/5ML IJ SOLN
INTRAMUSCULAR | Status: DC | PRN
  Administered 2020-02-27: 2 mg via INTRAVENOUS

## 2020-02-27 MED ORDER — SODIUM CHLORIDE 0.9 % IR SOLN
Status: DC | PRN
  Administered 2020-02-27: 1000 mL

## 2020-02-27 MED ORDER — OXYCODONE HCL 5 MG PO TABS: 5.0000 mg | ORAL_TABLET | Freq: Once | ORAL | Status: DC | PRN

## 2020-02-27 MED ORDER — LACTATED RINGERS IV SOLN: INTRAVENOUS | Status: DC

## 2020-02-27 SURGICAL SUPPLY — 24 items
BNDG CONFORM 2 STRL LF (GAUZE/BANDAGES/DRESSINGS) IMPLANT
COVER WAND RF STERILE (DRAPES) ×4 IMPLANT
DILATOR CANAL MILEX (MISCELLANEOUS) IMPLANT
DRSG PAD ABDOMINAL 8X10 ST (GAUZE/BANDAGES/DRESSINGS) ×4 IMPLANT
GAUZE 4X4 16PLY RFD (DISPOSABLE) ×4 IMPLANT
GLOVE BIO SURGEON STRL SZ7.5 (GLOVE) ×8 IMPLANT
GLOVE BIOGEL PI IND STRL 6.5 (GLOVE) ×2 IMPLANT
GLOVE BIOGEL PI IND STRL 7.5 (GLOVE) ×2 IMPLANT
GLOVE BIOGEL PI INDICATOR 6.5 (GLOVE) ×2
GLOVE BIOGEL PI INDICATOR 7.5 (GLOVE) ×2
GLOVE ECLIPSE 6.5 STRL STRAW (GLOVE) ×4 IMPLANT
GOWN STRL REUS W/TWL LRG LVL3 (GOWN DISPOSABLE) ×4 IMPLANT
HOLDER FOLEY CATH W/STRAP (MISCELLANEOUS) ×4 IMPLANT
IV NS 1000ML (IV SOLUTION) ×4
IV NS 1000ML BAXH (IV SOLUTION) ×2 IMPLANT
IV SET EXTENSION GRAVITY 40 LF (IV SETS) ×4 IMPLANT
KIT TURNOVER CYSTO (KITS) ×4 IMPLANT
MAT PREVALON FULL STRYKER (MISCELLANEOUS) ×4 IMPLANT
PACK VAGINAL MINOR WOMEN LF (CUSTOM PROCEDURE TRAY) ×4 IMPLANT
PACKING VAGINAL (PACKING) IMPLANT
PAD OB MATERNITY 4.3X12.25 (PERSONAL CARE ITEMS) IMPLANT
TOWEL OR 17X26 10 PK STRL BLUE (TOWEL DISPOSABLE) ×4 IMPLANT
TRAY FOLEY W/BAG SLVR 14FR LF (SET/KITS/TRAYS/PACK) ×4 IMPLANT
WATER STERILE IRR 500ML POUR (IV SOLUTION) ×4 IMPLANT

## 2020-02-27 NOTE — Transfer of Care (Signed)
Immediate Anesthesia Transfer of Care Note  Patient: Anne Harris  Procedure(s) Performed: TANDEM RING INSERTION (N/A Vagina ) OPERATIVE ULTRASOUND (N/A Abdomen)  Patient Location: PACU  Anesthesia Type:General  Level of Consciousness: awake, alert  and oriented  Airway & Oxygen Therapy: Patient Spontanous Breathing and Patient connected to nasal cannula oxygen  Post-op Assessment: Report given to RN  Post vital signs: Reviewed and stable  Last Vitals:  Vitals Value Taken Time  BP 15/8/82   Temp    Pulse 81 02/27/20 1057  Resp 9 02/27/20 1057  SpO2 98 % 02/27/20 1057  Vitals shown include unvalidated device data.  Last Pain:  Vitals:   02/27/20 0816  TempSrc: Oral  PainSc: 0-No pain      Patients Stated Pain Goal: 1 (16/10/96 0454)  Complications: No complications documented.

## 2020-02-27 NOTE — Progress Notes (Addendum)
1130 ampm Patient transferred from [facility] PACU to CT [facility] via stretcher with Quintin [place], [place]. Patient alert  and oriented x3. IV infusing at 125 cc/hr of LR to her Left hand. IV intact. Foley intact draining clear yellow urine to the drainage bag. Skin warm and dry. Respirations regular.  1205 Patient brought to room 1 in the Radiation Clinic for monitoring and observation. Patient denies any pain and declines offers for food or PO liquids. Alternating pressure stockings  On and waiting for portable equipment to bring the pump and tubing.Tubing attached to the Alaris IV pump and continues with rate of 125 cc/hr. Patient given call bell and side rails up bilaterally.  1300 Patient resting and requesting her IV be removed as it is painful to the touch. IV is not infiltrated but is inserted in a vein close to her thumb.1305 IV removed with tip intact. Patient requested some IM pain medicine as her IV was removed and does not want much pain when her Tandem ring is removed after her HDR.  1405 Patient given 0.5 mg IM of Dilaudid per order of Dr. Sondra Come.  1410 RT staff transported patient to [place] via stretcher.   1450 Patient back in the radiation clinic.  1500 Foley d/c'd tip intact.  1510 Tandem ring removed by Dr. Sondra Come.  1540  Patient discharged by w/c to home with her son. Patient given instructions to call MD with any problems and no driving for 24 hours after her anesthesia this morning. Patient given water to drink and declined food.  Patient had 150 cc LR in and 210 cc PO. Urine output was 550 cc.  Patient given a note for work stating she can RTW on 04/30/2020 per Dr. Sondra Come.

## 2020-02-27 NOTE — Anesthesia Preprocedure Evaluation (Addendum)
Anesthesia Evaluation  Patient identified by MRN, date of birth, ID band Patient awake    Reviewed: Allergy & Precautions, NPO status , Patient's Chart, lab work & pertinent test results  Airway Mallampati: II  TM Distance: >3 FB Neck ROM: Full    Dental no notable dental hx. (+) Poor Dentition, Dental Advisory Given,    Pulmonary  Mediastinal adenopathy in the setting of recent diagnosis of cervical ca   Pulmonary exam normal breath sounds clear to auscultation       Cardiovascular hypertension, Normal cardiovascular exam Rhythm:Regular Rate:Normal     Neuro/Psych negative neurological ROS  negative psych ROS   GI/Hepatic negative GI ROS, Neg liver ROS,   Endo/Other  Morbid obesityBMI 42  Renal/GU negative Renal ROS  Female GU complaint Hx cervical ca    Musculoskeletal negative musculoskeletal ROS (+)   Abdominal (+) + obese,   Peds negative pediatric ROS (+)  Hematology  (+) Blood dyscrasia, anemia , hct 35   Anesthesia Other Findings   Reproductive/Obstetrics negative OB ROS                            Anesthesia Physical  Anesthesia Plan  ASA: III  Anesthesia Plan: General   Post-op Pain Management:    Induction: Intravenous  PONV Risk Score and Plan: 3 and Ondansetron, Dexamethasone, Midazolam and Treatment may vary due to age or medical condition  Airway Management Planned: Oral ETT and LMA  Additional Equipment: None  Intra-op Plan:   Post-operative Plan: Extubation in OR  Informed Consent: I have reviewed the patients History and Physical, chart, labs and discussed the procedure including the risks, benefits and alternatives for the proposed anesthesia with the patient or authorized representative who has indicated his/her understanding and acceptance.     Dental advisory given  Plan Discussed with: CRNA and Anesthesiologist  Anesthesia Plan Comments:          Anesthesia Quick Evaluation

## 2020-02-27 NOTE — Anesthesia Postprocedure Evaluation (Signed)
Anesthesia Post Note  Patient: [name]  Procedure(s) Performed: TANDEM RING INSERTION (N/A Vagina ) OPERATIVE ULTRASOUND (N/A Abdomen)     Patient location during evaluation: PACU Anesthesia Type: General Level of consciousness: awake and alert Pain management: pain level controlled Vital Signs Assessment: post-procedure vital signs reviewed and stable Respiratory status: spontaneous breathing, nonlabored ventilation, respiratory function stable and patient connected to nasal cannula oxygen Cardiovascular status: blood pressure returned to baseline and stable Postop Assessment: no apparent nausea or vomiting Anesthetic complications: no   No complications documented.  Last Vitals:  Vitals:   02/27/20 1115 02/27/20 1130  BP: (!) 141/81 (!) 146/79  Pulse: 81 83  Resp: 12 16  Temp:    SpO2: 98% 95%    Last Pain:  Vitals:   02/27/20 1130  TempSrc:   PainSc: Asleep                 Bailee Metter

## 2020-02-27 NOTE — Progress Notes (Signed)
°  Radiation Oncology         (336) [phone] ________________________________  Name: Anne Harris MRN: [id number]  Date: 02/27/2020  DOB: [birth year]  CC: Gildardo Pounds, NP  Lafonda Mosses, MD  HDR BRACHYTHERAPY NOTE  DIAGNOSIS: Stage IIB squamous cell carcinoma of the cervix   Simple treatment device note: On the operating room the patient had construction of her custom tandem ring system. She will be treated with a 45 tandem/ring system with a 6 cm length tandem. This conforms to her anatomy without undue discomfort. A rectal paddle was also part of her custom set up device.  Vaginal brachytherapy procedure node: The patient was brought to the [facility] suite. Identity was confirmed. All relevant records and images related to the planned course of therapy were reviewed. The patient freely provided informed written consent to proceed with treatment after reviewing the details related to the planned course of therapy. The consent form was witnessed and verified by the simulation staff. Then, the patient was set-up in a stable reproducible supine position for radiation therapy. The tandem ring system was accessed and fiducial markers were placed within the tandem and ring.   Verification simulation note: An AP and lateral film was obtained through the pelvis area. This was compared to the patient's planning films documenting accurate position of the tandem/ringsystem for treatment.  High-dose-rate brachytherapy treatment note:   The remote afterloading device was accessed through catheter system and attached to the tandem ring system. Patient then proceeded to undergo her first high-dose-rate treatment directed at the cervix. The patient was prescribed a dose of 5.5 gray to be delivered to the high risk clinical target volume. Treatment length was 6 cm. Patient was treated with 2 channels using 26 dwell positions. Treatment time was 741.0 seconds. The patient tolerated the procedure well.  After completion of her therapy, a radiation survey was performed documenting return of the iridium source into the [employer] safe. The patient was then transferred to the nursing suite.  She then had removal of the rectal paddle followed by the tandem and ring system. The patient tolerated the removal well.  PLAN: The patient will return in one week for her second high-dose-rate treatment. ________________________________     Blair Promise, PhD, MD  This document serves as a record of services personally performed by Gery Pray, MD. It was created on his behalf by Clerance Lav, a trained medical scribe. The creation of this record is based on the scribe's personal observations and the provider's statements to them. This document has been checked and approved by the attending provider.

## 2020-02-27 NOTE — Interval H&P Note (Signed)
History and Physical Interval Note:  02/27/2020 9:56 AM  Rechelle D Pressman  has presented today for surgery, with the diagnosis of CERVIAL CANCER.  The various methods of treatment have been discussed with the patient and family. After consideration of risks, benefits and other options for treatment, the patient has consented to  Procedure(s): TANDEM RING INSERTION (N/A) OPERATIVE ULTRASOUND (N/A) as a surgical intervention.  The patient's history has been reviewed, patient examined, no change in status, stable for surgery.  I have reviewed the patient's chart and labs.  Questions were answered to the patient's satisfaction.     Gery Pray

## 2020-02-27 NOTE — Anesthesia Procedure Notes (Signed)
Procedure Name: LMA Insertion Date/Time: 02/27/2020 10:17 AM Performed by: Bonney Aid, CRNA Pre-anesthesia Checklist: Patient identified, Emergency Drugs available, Suction available and Patient being monitored Patient Re-evaluated:Patient Re-evaluated prior to induction Oxygen Delivery Method: Circle system utilized Preoxygenation: Pre-oxygenation with 100% oxygen Induction Type: IV induction Ventilation: Mask ventilation without difficulty LMA: LMA inserted LMA Size: 4.0 Number of attempts: 1 Airway Equipment and Method: Bite block Placement Confirmation: positive ETCO2 Tube secured with: Tape Dental Injury: Teeth and Oropharynx as per pre-operative assessment  Comments: Lower teeth loose, not contact made with lma on insertion

## 2020-02-27 NOTE — Op Note (Signed)
02/27/2020  11:29 AM  PATIENT:  Anne Harris  59 y.o. female  PRE-OPERATIVE DIAGNOSIS:  CERVIAL CANCER  POST-OPERATIVE DIAGNOSIS:  CERVIAL CANCER  PROCEDURE:  Procedure(s): TANDEM RING INSERTION (N/A) OPERATIVE ULTRASOUND (N/A)  SURGEON:  Surgeon(s) and Role:    * Gery Pray, MD - Primary  PHYSICIAN ASSISTANT:   ASSISTANTS: none   ANESTHESIA:   general  EBL:  5 mL   BLOOD ADMINISTERED:none  DRAINS: Urinary Catheter (Foley)   LOCAL MEDICATIONS USED:  NONE  SPECIMEN:  No Specimen  DISPOSITION OF SPECIMEN:  N/A  COUNTS:  YES  TOURNIQUET:  * No tourniquets in log *  DICTATION: Patient was taken to the outpatient surgical suite OR, room #1.    The patient was prepped and draped in the usual sterile fashion and placed in the dorsolithotomy position.  Timeout for the procedure, estimated length of the procedure and preoperative medications was performed.  A Foley catheter was placed without difficulty.  For imaging purposes approximately 300 cc of sterile water was placed within the bladder.  Exam under anesthesia revealed an excellent response to the patient's radiation therapy (external beam and radiosensitizing chemotherapy).  The cervical region was estimated to be approximately 3 x 3 cm.  No obvious parametrial involvement at this time.  Some slight nodularity noted to the cervix region at the location of the cervical os.  Rectovaginal exam confirmed bimanual examination.  The patient then proceeded to undergo sounding of the uterus.  The uterus was estimated to be approximately 6 to 7 cm in length.  This was then followed by dilation of the cervical os without difficulty.  A 60 mm cervical sleeve was placed within the endocervical canal and endometrial canal.  I then placed a 64mm 45 degree tandem within the cervical sleeve.  A 45 degree cervical ring was then attached to the tandem.  This had a small shielding Covering the ring portion.  Patient then had a rectal paddle  placed posteriorly.  Ultrasound imaging at the completion of the procedure showed good placement of the tandem and ring for high-dose-rate radiation therapy.  After recovery from anesthesia the patient will be transferred to radiation oncology for CT simulation with tandem ring in place.  She will later this day undergo her first high-dose-rate treatment directed to the cervical region.  She will receive 5.5 Gy to the high risk clinical target volume.  Plan is for her to receive 4 additional  high-dose-rate brachytherapy treatments.  Iridium 192 will be the high-dose-rate source.  PLAN OF CARE: Transferred to radiation oncology for planning and treatment  PATIENT DISPOSITION:  PACU - hemodynamically stable.   Delay start of Pharmacological VTE agent (>24hrs) due to surgical blood loss or risk of bleeding: not applicable

## 2020-02-27 NOTE — Addendum Note (Signed)
Encounter addended by: De Burrs, RN on: 02/27/2020 6:12 PM  Actions taken: LDA properties accepted, Clinical Note Signed

## 2020-02-28 ENCOUNTER — Encounter (HOSPITAL_BASED_OUTPATIENT_CLINIC_OR_DEPARTMENT_OTHER): Payer: Self-pay | Admitting: Radiation Oncology

## 2020-02-29 ENCOUNTER — Encounter (HOSPITAL_BASED_OUTPATIENT_CLINIC_OR_DEPARTMENT_OTHER): Payer: Self-pay | Admitting: Radiation Oncology

## 2020-02-29 ENCOUNTER — Other Ambulatory Visit: Payer: Self-pay

## 2020-02-29 NOTE — Progress Notes (Signed)
Spoke w/ via phone for pre-op interview--- Lab needs dos----               Lab results------ COVID test 03/03/20 1000 Arrive at 0830 NPO after MN NO Solid Food.  Clear liquids from MN [phone] Medications to take morning of surgery AMLODIPINEI  Diabetic medication  Patient Special Instructions ----- Pre-Op special Istructions ----- Patient verbalized understanding of instructions that were given at this phone interview. Patient denies shortness of breath, chest pain, fever, cough at this phone interview.

## 2020-02-29 NOTE — Progress Notes (Incomplete)
  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 She did report dysuria for which she was seen in the ED, diagnosed with a UTI, and prescribed an antibiotic. She also reported mild to moderate fatigue, poor appetite, lower pelvic pain, weight loss, and diarrhea controlled with Imodium. She denied nausea, vomiting, rectal bleeding, vaginal bleeding, urinary frequency, vaginal odor/itching, skin changes, headache, and dizziness.  Plan: Given the patient's fatigue and overall performance status, she is unsure is she would like to proceed with recommended brachytherapy treatments.  ________________________________________________   Blair Promise, PhD, MD  This document serves as a record of services personally performed by Gery Pray, MD. It was created on his behalf by Clerance Lav, a trained medical scribe. The creation of this record is based on the scribe's personal observations and the provider's statements to them. This document has been checked and approved by the attending provider.

## 2020-03-03 ENCOUNTER — Other Ambulatory Visit (HOSPITAL_COMMUNITY)
Admission: RE | Admit: 2020-03-03 | Discharge: 2020-03-03 | Disposition: A | Discharge status: Home / Self Care | Payer: BC Managed Care – PPO | Source: Ambulatory Visit | Attending: Radiation Oncology | Admitting: Radiation Oncology

## 2020-03-03 DIAGNOSIS — Z01812 Encounter for preprocedural laboratory examination: Secondary | ICD-10-CM | POA: Diagnosis not present

## 2020-03-03 DIAGNOSIS — Z20822 Contact with and (suspected) exposure to covid-19: Secondary | ICD-10-CM | POA: Insufficient documentation

## 2020-03-03 LAB — SARS CORONAVIRUS 2 (TAT 6-24 HRS): SARS Coronavirus 2: NEGATIVE

## 2020-03-04 NOTE — Anesthesia Preprocedure Evaluation (Addendum)
Anesthesia Evaluation  Patient identified by MRN, date of birth, ID band Patient awake    Reviewed: Allergy & Precautions, NPO status , Patient's Chart, lab work & pertinent test results  Airway Mallampati: II  TM Distance: >3 FB Neck ROM: Full    Dental no notable dental hx. (+) Poor Dentition,    Pulmonary neg pulmonary ROS,    Pulmonary exam normal breath sounds clear to auscultation       Cardiovascular hypertension, Pt. on medications Normal cardiovascular exam Rhythm:Regular Rate:Normal  12/15/19 EKG SR R 85   Neuro/Psych negative neurological ROS  negative psych ROS   GI/Hepatic negative GI ROS, Neg liver ROS,   Endo/Other  negative endocrine ROS  Renal/GU negative Renal ROSCr 0.78 K+ 3.6     Musculoskeletal negative musculoskeletal ROS (+)   Abdominal (+) + obese,   Peds  Hematology Hgb 10.9   Anesthesia Other Findings   Reproductive/Obstetrics negative OB ROS                            Anesthesia Physical Anesthesia Plan  ASA: III  Anesthesia Plan: General   Post-op Pain Management:    Induction: Intravenous  PONV Risk Score and Plan: 4 or greater and Treatment may vary due to age or medical condition, Ondansetron, Dexamethasone and Midazolam  Airway Management Planned: LMA  Additional Equipment: None  Intra-op Plan:   Post-operative Plan:   Informed Consent: I have reviewed the patients History and Physical, chart, labs and discussed the procedure including the risks, benefits and alternatives for the proposed anesthesia with the patient or authorized representative who has indicated his/her understanding and acceptance.     Dental advisory given  Plan Discussed with: CRNA  Anesthesia Plan Comments: (64F W Cervical CA w HTN for Tandam ring insertion under GA)       Anesthesia Quick Evaluation

## 2020-03-04 NOTE — Progress Notes (Signed)
  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 She will be treated with a 45 tandem/ring system with a 6 cm length tandem. This conforms to her anatomy without undue discomfort. A rectal paddle was also part of her custom set up device.  Vaginal brachytherapy procedure node: The patient was brought to the [place] suite. Identity was confirmed. All relevant records and images related to the planned course of therapy were reviewed. The patient freely provided informed written consent to proceed with treatment after reviewing the details related to the planned course of therapy. The consent form was witnessed and verified by the simulation staff. Then, the patient was set-up in a stable reproducible supine position for radiation therapy. The tandem ring system was accessed and fiducial markers were placed within the tandem and ring.   Verification simulation note: An AP and lateral film was obtained through the pelvis area. This was compared to the patient's planning films documenting accurate position of the tandem/ringsystem for treatment.  High-dose-rate brachytherapy treatment note:   The remote afterloading device was accessed through catheter system and attached to the tandem ring system. Patient then proceeded to undergo her second high-dose-rate treatment directed at the cervix. The patient was prescribed a dose of 5.5 gray to be delivered to the high risk clinical target volume.  Patient was treated with 2 channels using 23 dwell positions. Treatment time was 744.8 seconds. The patient tolerated the procedure well. After completion of  her therapy, a radiation survey was performed documenting return of the iridium source into the [employer] safe. The patient was then transferred to the nursing suite.  She then had removal of the rectal paddle followed by the tandem and ring system. The patient tolerated the removal well.  PLAN: The patient will return next week for her third high-dose-rate treatment. ________________________________     Blair Promise, PhD, MD  This document serves as a record of services personally performed by Gery Pray, MD. It was created on his behalf by Clerance Lav, a trained medical scribe. The creation of this record is based on the scribe's personal observations and the provider's statements to them. This document has been checked and approved by the attending provider.

## 2020-03-05 ENCOUNTER — Encounter (HOSPITAL_BASED_OUTPATIENT_CLINIC_OR_DEPARTMENT_OTHER): Payer: Self-pay | Admitting: Radiation Oncology

## 2020-03-05 ENCOUNTER — Ambulatory Visit (HOSPITAL_COMMUNITY)
Admission: RE | Admit: 2020-03-05 | Discharge: 2020-03-05 | Disposition: A | Discharge status: Home / Self Care | Payer: BC Managed Care – PPO | Source: Ambulatory Visit | Attending: Radiation Oncology | Admitting: Radiation Oncology

## 2020-03-05 ENCOUNTER — Ambulatory Visit (HOSPITAL_BASED_OUTPATIENT_CLINIC_OR_DEPARTMENT_OTHER)
Admission: RE | Admit: 2020-03-05 | Discharge: 2020-03-05 | Disposition: A | Discharge status: Home / Self Care | Payer: BC Managed Care – PPO | Attending: Radiation Oncology | Admitting: Radiation Oncology

## 2020-03-05 ENCOUNTER — Encounter (HOSPITAL_BASED_OUTPATIENT_CLINIC_OR_DEPARTMENT_OTHER)
Admission: RE | Disposition: A | Discharge status: Home / Self Care | Payer: Self-pay | Source: Home / Self Care | Attending: Radiation Oncology

## 2020-03-05 ENCOUNTER — Ambulatory Visit
Admission: RE | Admit: 2020-03-05 | Discharge: 2020-03-05 | Disposition: A | Discharge status: Home / Self Care | Payer: BC Managed Care – PPO | Source: Ambulatory Visit | Attending: Radiation Oncology | Admitting: Radiation Oncology

## 2020-03-05 ENCOUNTER — Ambulatory Visit (HOSPITAL_BASED_OUTPATIENT_CLINIC_OR_DEPARTMENT_OTHER): Payer: BC Managed Care – PPO | Admitting: Anesthesiology

## 2020-03-05 ENCOUNTER — Other Ambulatory Visit: Payer: Self-pay

## 2020-03-05 DIAGNOSIS — C539 Malignant neoplasm of cervix uteri, unspecified: Secondary | ICD-10-CM | POA: Diagnosis not present

## 2020-03-05 DIAGNOSIS — Z9221 Personal history of antineoplastic chemotherapy: Secondary | ICD-10-CM | POA: Diagnosis not present

## 2020-03-05 DIAGNOSIS — I10 Essential (primary) hypertension: Secondary | ICD-10-CM | POA: Insufficient documentation

## 2020-03-05 DIAGNOSIS — Z6835 Body mass index (BMI) 35.0-35.9, adult: Secondary | ICD-10-CM | POA: Diagnosis not present

## 2020-03-05 DIAGNOSIS — R590 Localized enlarged lymph nodes: Secondary | ICD-10-CM | POA: Insufficient documentation

## 2020-03-05 DIAGNOSIS — Z886 Allergy status to analgesic agent status: Secondary | ICD-10-CM | POA: Diagnosis not present

## 2020-03-05 DIAGNOSIS — Z95828 Presence of other vascular implants and grafts: Secondary | ICD-10-CM | POA: Insufficient documentation

## 2020-03-05 DIAGNOSIS — R918 Other nonspecific abnormal finding of lung field: Secondary | ICD-10-CM | POA: Insufficient documentation

## 2020-03-05 DIAGNOSIS — Z800 Family history of malignant neoplasm of digestive organs: Secondary | ICD-10-CM | POA: Insufficient documentation

## 2020-03-05 DIAGNOSIS — Z79899 Other long term (current) drug therapy: Secondary | ICD-10-CM | POA: Diagnosis not present

## 2020-03-05 DIAGNOSIS — N858 Other specified noninflammatory disorders of uterus: Secondary | ICD-10-CM | POA: Insufficient documentation

## 2020-03-05 HISTORY — PX: TANDEM RING INSERTION: SHX6199

## 2020-03-05 HISTORY — PX: OPERATIVE ULTRASOUND: SHX5996

## 2020-03-05 SURGERY — INSERTION, UTERINE TANDEM AND RING OR CYLINDER, FOR BRACHYTHERAPY
Anesthesia: General | Site: Vagina

## 2020-03-05 MED ORDER — LABETALOL HCL 5 MG/ML IV SOLN
INTRAVENOUS | Status: DC | PRN
  Administered 2020-03-05: 5 mg via INTRAVENOUS
  Administered 2020-03-05: 10 mg via INTRAVENOUS

## 2020-03-05 MED ORDER — DEXAMETHASONE SODIUM PHOSPHATE 10 MG/ML IJ SOLN
INTRAMUSCULAR | Status: AC
  Filled 2020-03-05: qty 1

## 2020-03-05 MED ORDER — LIDOCAINE 2% (20 MG/ML) 5 ML SYRINGE
INTRAMUSCULAR | Status: AC
  Filled 2020-03-05: qty 5

## 2020-03-05 MED ORDER — ONDANSETRON HCL 4 MG/2ML IJ SOLN
INTRAMUSCULAR | Status: DC | PRN
  Administered 2020-03-05: 4 mg via INTRAVENOUS

## 2020-03-05 MED ORDER — LIDOCAINE 2% (20 MG/ML) 5 ML SYRINGE
INTRAMUSCULAR | Status: DC | PRN
  Administered 2020-03-05: 80 mg via INTRAVENOUS

## 2020-03-05 MED ORDER — ONDANSETRON HCL 4 MG/2ML IJ SOLN
INTRAMUSCULAR | Status: AC
  Filled 2020-03-05: qty 2

## 2020-03-05 MED ORDER — LABETALOL HCL 5 MG/ML IV SOLN
5.0000 mg | Freq: Once | INTRAVENOUS | Status: AC
  Administered 2020-03-05: 5 mg via INTRAVENOUS

## 2020-03-05 MED ORDER — ACETAMINOPHEN 10 MG/ML IV SOLN: 1000.0000 mg | Freq: Once | INTRAVENOUS | Status: DC | PRN

## 2020-03-05 MED ORDER — POVIDONE-IODINE 10 % EX SWAB: 2.0000 "application " | Freq: Once | CUTANEOUS | Status: DC

## 2020-03-05 MED ORDER — SODIUM CHLORIDE 0.9% FLUSH
10.0000 mL | Freq: Once | INTRAVENOUS | Status: AC
  Administered 2020-03-05: 10 mL via INTRAVENOUS

## 2020-03-05 MED ORDER — OXYCODONE HCL 5 MG/5ML PO SOLN: 5.0000 mg | Freq: Once | ORAL | Status: DC | PRN

## 2020-03-05 MED ORDER — MIDAZOLAM HCL 2 MG/2ML IJ SOLN
INTRAMUSCULAR | Status: AC
  Filled 2020-03-05: qty 2

## 2020-03-05 MED ORDER — HYDROMORPHONE HCL 1 MG/ML IJ SOLN
INTRAMUSCULAR | Status: AC
  Filled 2020-03-05: qty 1

## 2020-03-05 MED ORDER — FENTANYL CITRATE (PF) 250 MCG/5ML IJ SOLN
INTRAMUSCULAR | Status: AC
  Filled 2020-03-05: qty 5

## 2020-03-05 MED ORDER — MIDAZOLAM HCL 5 MG/5ML IJ SOLN
INTRAMUSCULAR | Status: DC | PRN
  Administered 2020-03-05: 1 mg via INTRAVENOUS

## 2020-03-05 MED ORDER — LABETALOL HCL 5 MG/ML IV SOLN
INTRAVENOUS | Status: AC
  Filled 2020-03-05: qty 4

## 2020-03-05 MED ORDER — HEPARIN SOD (PORK) LOCK FLUSH 100 UNIT/ML IV SOLN
500.0000 [IU] | Freq: Once | INTRAVENOUS | Status: AC
  Administered 2020-03-05: 500 [IU] via INTRAVENOUS

## 2020-03-05 MED ORDER — MEPERIDINE HCL 25 MG/ML IJ SOLN: 6.2500 mg | INTRAMUSCULAR | Status: DC | PRN

## 2020-03-05 MED ORDER — ONDANSETRON HCL 4 MG/2ML IJ SOLN: 4.0000 mg | Freq: Once | INTRAMUSCULAR | Status: DC | PRN

## 2020-03-05 MED ORDER — HYDROMORPHONE HCL 1 MG/ML IJ SOLN
0.5000 mg | Freq: Once | INTRAMUSCULAR | Status: AC
  Administered 2020-03-05: 0.5 mg via INTRAVENOUS
  Filled 2020-03-05: qty 1

## 2020-03-05 MED ORDER — LACTATED RINGERS IV SOLN: INTRAVENOUS | Status: DC

## 2020-03-05 MED ORDER — FENTANYL CITRATE (PF) 100 MCG/2ML IJ SOLN
INTRAMUSCULAR | Status: DC | PRN
Start: 2020-03-05 — End: 2020-03-05
  Administered 2020-03-05 (×2): 50 ug via INTRAVENOUS

## 2020-03-05 MED ORDER — DEXMEDETOMIDINE (PRECEDEX) IN NS 20 MCG/5ML (4 MCG/ML) IV SYRINGE
PREFILLED_SYRINGE | INTRAVENOUS | Status: DC | PRN
  Administered 2020-03-05: 8 ug via INTRAVENOUS

## 2020-03-05 MED ORDER — PROPOFOL 10 MG/ML IV BOLUS
INTRAVENOUS | Status: AC
  Filled 2020-03-05: qty 20

## 2020-03-05 MED ORDER — DEXAMETHASONE SODIUM PHOSPHATE 10 MG/ML IJ SOLN
INTRAMUSCULAR | Status: DC | PRN
  Administered 2020-03-05: 5 mg via INTRAVENOUS

## 2020-03-05 MED ORDER — PROPOFOL 10 MG/ML IV BOLUS
INTRAVENOUS | Status: DC | PRN
  Administered 2020-03-05: 50 mg via INTRAVENOUS
  Administered 2020-03-05: 120 mg via INTRAVENOUS

## 2020-03-05 MED ORDER — DEXMEDETOMIDINE (PRECEDEX) IN NS 20 MCG/5ML (4 MCG/ML) IV SYRINGE
PREFILLED_SYRINGE | INTRAVENOUS | Status: AC
  Filled 2020-03-05: qty 5

## 2020-03-05 MED ORDER — AMISULPRIDE (ANTIEMETIC) 5 MG/2ML IV SOLN: 10.0000 mg | Freq: Once | INTRAVENOUS | Status: DC | PRN

## 2020-03-05 MED ORDER — FENTANYL CITRATE (PF) 100 MCG/2ML IJ SOLN: 25.0000 ug | INTRAMUSCULAR | Status: DC | PRN

## 2020-03-05 MED ORDER — OXYCODONE HCL 5 MG PO TABS: 5.0000 mg | ORAL_TABLET | Freq: Once | ORAL | Status: DC | PRN

## 2020-03-05 SURGICAL SUPPLY — 19 items
BNDG CONFORM 2 STRL LF (GAUZE/BANDAGES/DRESSINGS) IMPLANT
COVER WAND RF STERILE (DRAPES) ×4 IMPLANT
DILATOR CANAL MILEX (MISCELLANEOUS) IMPLANT
DRSG PAD ABDOMINAL 8X10 ST (GAUZE/BANDAGES/DRESSINGS) ×4 IMPLANT
GAUZE 4X4 16PLY RFD (DISPOSABLE) ×4 IMPLANT
GLOVE BIO SURGEON STRL SZ7.5 (GLOVE) ×8 IMPLANT
GOWN STRL REUS W/TWL LRG LVL3 (GOWN DISPOSABLE) ×4 IMPLANT
HOLDER FOLEY CATH W/STRAP (MISCELLANEOUS) ×8 IMPLANT
IV NS 1000ML (IV SOLUTION) ×4
IV NS 1000ML BAXH (IV SOLUTION) ×2 IMPLANT
IV SET EXTENSION GRAVITY 40 LF (IV SETS) ×4 IMPLANT
KIT TURNOVER CYSTO (KITS) ×4 IMPLANT
MAT PREVALON FULL STRYKER (MISCELLANEOUS) ×4 IMPLANT
PACK VAGINAL MINOR WOMEN LF (CUSTOM PROCEDURE TRAY) ×4 IMPLANT
PACKING VAGINAL (PACKING) IMPLANT
PAD OB MATERNITY 4.3X12.25 (PERSONAL CARE ITEMS) IMPLANT
TOWEL OR 17X26 10 PK STRL BLUE (TOWEL DISPOSABLE) ×4 IMPLANT
TRAY FOLEY W/BAG SLVR 14FR LF (SET/KITS/TRAYS/PACK) ×4 IMPLANT
WATER STERILE IRR 500ML POUR (IV SOLUTION) ×4 IMPLANT

## 2020-03-05 NOTE — Discharge Instructions (Signed)

## 2020-03-05 NOTE — Progress Notes (Signed)
1150 am am Patient transferred from [facility] PACU to room # 1 in the Radiation Clinic via stretcher with Quintin Alto, [place]. Patient alert and oriented x3. IV infusing at 125 cc/hr of LR to patient's right chest port.  Foley intact draining clear yellow urine to the drainage bag. Skin warm and dry. Respirations regular. Patient denies pain Vitals  T-96.9 P-83 R-18 BP 150/78 O2 sat 93% 1230 Patient given lunch tray.  1250 pm Patient taken to CT by Radiation Staff by stretcher and was returned to room # 1 at 110 pm. Patient resting on her back and declines any needs at this time. Automatic pressure stockings attached to the pump.  1315  am Eyes open and declines pain med.  1350  Patient resting on stretcher. Patient denies need for pain medicine.  1430 patient dozing in the room and denies need for pain medication. 1515 Patient requesting pain med prior to moving to the HDR room in 10 minutes. Verbal order given by Dr. Sondra Come for 0.5mg  Dilaudid IV. 1520 Dilaudid 0.5 mg IV given through patient's right chest port.  1525 patient taken by stretcher to the [facility] room for her treatment by the radiation staff. 1615 Patient back in room 1 and reports the Dilaudid she received helped her relax. 1620 Alternating pressure stockings removed from patient's legs. 1625 Foley catheter removed with tip intact. [place] Dr. Sondra Come came into the room and removed the Tandem Ring from the patient without problems. See avatar and med documentation for chest port IV removed by Egbert Garibaldi RN without problems. 1700 Patient discharged home via w/c to the first floor where her son picked her up to take her home. Patient had printed instructions to call with any problems and no driving for 24 hours after anesthesia.  PO intake 240 cc, IV intake of LR 400cc, u/o from foley catheter

## 2020-03-05 NOTE — Anesthesia Procedure Notes (Signed)
Procedure Name: LMA Insertion Date/Time: 03/05/2020 10:35 AM Performed by: Rogers Blocker, CRNA Pre-anesthesia Checklist: Patient identified, Emergency Drugs available, Suction available and Patient being monitored Patient Re-evaluated:Patient Re-evaluated prior to induction Oxygen Delivery Method: Circle System Utilized Preoxygenation: Pre-oxygenation with 100% oxygen Induction Type: IV induction Ventilation: Mask ventilation without difficulty LMA: LMA inserted LMA Size: 4.0 Number of attempts: 1 Airway Equipment and Method: Bite block Placement Confirmation: positive ETCO2 Tube secured with: Tape Dental Injury: Teeth and Oropharynx as per pre-operative assessment

## 2020-03-05 NOTE — Anesthesia Postprocedure Evaluation (Signed)
Anesthesia Post Note  Patient: Anne Harris  Procedure(s) Performed: TANDEM RING INSERTION (N/A Vagina ) OPERATIVE ULTRASOUND (N/A Abdomen)     Patient location during evaluation: PACU Anesthesia Type: General Level of consciousness: awake and alert Pain management: pain level controlled Vital Signs Assessment: post-procedure vital signs reviewed and stable Respiratory status: spontaneous breathing, nonlabored ventilation, respiratory function stable and patient connected to nasal cannula oxygen Cardiovascular status: blood pressure returned to baseline and stable Postop Assessment: no apparent nausea or vomiting Anesthetic complications: no   No complications documented.  Last Vitals:  Vitals:   03/05/20 1115 03/05/20 1130  BP: 134/79 (!) 145/82  Pulse: 79 82  Resp: 14 14  Temp:    SpO2: 92% 92%    Last Pain:  Vitals:   03/05/20 1109  TempSrc:   PainSc: 0-No pain                 Barnet Glasgow

## 2020-03-05 NOTE — Transfer of Care (Signed)
Immediate Anesthesia Transfer of Care Note  Patient: Anne Harris  Procedure(s) Performed: TANDEM RING INSERTION (N/A Vagina ) OPERATIVE ULTRASOUND (N/A Abdomen)  Patient Location: PACU  Anesthesia Type:General  Level of Consciousness: awake, alert , oriented and patient cooperative  Airway & Oxygen Therapy: Patient Spontanous Breathing  Post-op Assessment: Report given to RN, Post -op Vital signs reviewed and stable and Patient moving all extremities  Post vital signs: Reviewed and stable  Last Vitals:  Vitals Value Taken Time  BP 135/77 03/05/20 1109  Temp 36.7 C 03/05/20 1109  Pulse 81 03/05/20 1113  Resp 9 03/05/20 1113  SpO2 93 % 03/05/20 1113  Vitals shown include unvalidated device data.  Last Pain:  Vitals:   03/05/20 0918  TempSrc: Oral  PainSc: 2       Patients Stated Pain Goal: 2 (47/34/03 7096)  Complications: No complications documented.

## 2020-03-05 NOTE — Addendum Note (Signed)
Encounter addended by: De Burrs, RN on: 03/05/2020 5:56 PM  Actions taken: Clinical Note Signed, LDA properties accepted

## 2020-03-05 NOTE — Interval H&P Note (Signed)
History and Physical Interval Note:  03/05/2020 10:26 AM  Anne Harris  has presented today for surgery, with the diagnosis of CERVICAL CANCER.  The various methods of treatment have been discussed with the patient and family. After consideration of risks, benefits and other options for treatment, the patient has consented to  Procedure(s): TANDEM RING INSERTION (N/A) OPERATIVE ULTRASOUND (N/A) as a surgical intervention.  The patient's history has been reviewed, patient examined, no change in status, stable for surgery.  I have reviewed the patient's chart and labs.  Questions were answered to the patient's satisfaction.     Gery Pray

## 2020-03-05 NOTE — Op Note (Signed)
03/05/2020  2:17 PM  PATIENT:  Anne Harris  59 y.o. female  PRE-OPERATIVE DIAGNOSIS:  CERVICAL CANCER  POST-OPERATIVE DIAGNOSIS:  CERVICAL CANCER  PROCEDURE:  Procedure(s): TANDEM RING INSERTION (N/A) OPERATIVE ULTRASOUND (N/A)  SURGEON:  Surgeon(s) and Role:    * Gery Pray, MD - Primary  PHYSICIAN ASSISTANT:   ASSISTANTS: none   ANESTHESIA:   general  EBL:  2 mL   BLOOD ADMINISTERED:none  DRAINS: Urinary Catheter (Foley)   LOCAL MEDICATIONS USED:  NONE  SPECIMEN:  No Specimen  DISPOSITION OF SPECIMEN:  N/A  COUNTS:  YES  TOURNIQUET:  * No tourniquets in log *  DICTATION: Patient was taken to the outpatient surgical suite OR, room #3.    The patient was prepped and draped in the usual sterile fashion and placed in the dorsolithotomy position.  Timeout for the procedure, estimated length of the procedure and preoperative medications was performed.  A Foley catheter was placed without difficulty.  For imaging purposes approximately 300 cc of sterile water was placed within the bladder.  Excellent ultrasound images were obtained. An estimate of the cervical mass based on ultrasound measurements was placed in the radiology report.  Exam under anesthesia revealed an excellent response to the patient's radiation therapy (external beam and radiosensitizing chemotherapy).  The cervical region was estimated to be approximately 3 x 2.75 cm.  No obvious parametrial involvement at this time.  Some slight nodularity noted to the cervix region at the location of the cervical os.   The patient then proceeded to undergo sounding of the uterus.  The uterus was estimated to be approximately 6 to 7 cm in length.  This was then followed by dilation of the cervical os without difficulty.  A 60 mm cervical sleeve was placed within the endocervical canal and endometrial canal.  I then placed a 63mm 45 degree tandem within the cervical sleeve.  A 45 degree cervical ring was then attached to  the tandem.  This had a small shielding Covering the ring portion.  Patient then had a rectal paddle placed posteriorly.  Ultrasound imaging at the completion of the procedure showed good placement of the tandem and ring for high-dose-rate radiation therapy.  After recovery from anesthesia the patient will be transferred to radiation oncology for CT simulation with tandem ring in place.  She will later this day undergo her second high-dose-rate treatment directed to the cervical region.  She will receive 5.5 Gy to the high risk clinical target volume.  Plan is for her to receive 3 additional  high-dose-rate brachytherapy treatments.  Iridium 192 will be the high-dose-rate source.  PLAN OF CARE: Transferred to radiation oncology for planning and treatment  PATIENT DISPOSITION:  PACU - hemodynamically stable.   Delay start of Pharmacological VTE agent (>24hrs) due to surgical blood loss or risk of bleeding: not applicable

## 2020-03-06 ENCOUNTER — Encounter (HOSPITAL_BASED_OUTPATIENT_CLINIC_OR_DEPARTMENT_OTHER): Payer: Self-pay | Admitting: Radiation Oncology

## 2020-03-08 ENCOUNTER — Encounter (HOSPITAL_BASED_OUTPATIENT_CLINIC_OR_DEPARTMENT_OTHER): Payer: Self-pay | Admitting: Radiation Oncology

## 2020-03-08 ENCOUNTER — Other Ambulatory Visit: Payer: Self-pay

## 2020-03-08 NOTE — Progress Notes (Signed)
Spoke w/ via phone for pre-op interview---pt Lab needs dos----   none            Lab results------none COVID test ------03-12-2020 1300 pm Arrive at -------630 am 03-14-2020 NPO after MN NO Solid Food.  Clear liquids from MN until---530 am then npo Medications to take morning of surgery -----amlodipine Diabetic medication -----n/a Patient Special Instructions -----none Pre-Op special Istructions -----none Patient verbalized understanding of instructions that were given at this phone interview. Patient denies shortness of breath, chest pain, fever, cough at this phone interview.

## 2020-03-12 ENCOUNTER — Other Ambulatory Visit (HOSPITAL_COMMUNITY)
Admission: RE | Admit: 2020-03-12 | Discharge: 2020-03-12 | Disposition: A | Discharge status: Home / Self Care | Payer: BC Managed Care – PPO | Source: Ambulatory Visit | Attending: Radiation Oncology | Admitting: Radiation Oncology

## 2020-03-12 DIAGNOSIS — Z20822 Contact with and (suspected) exposure to covid-19: Secondary | ICD-10-CM | POA: Diagnosis not present

## 2020-03-12 DIAGNOSIS — Z01812 Encounter for preprocedural laboratory examination: Secondary | ICD-10-CM | POA: Diagnosis present

## 2020-03-12 LAB — SARS CORONAVIRUS 2 (TAT 6-24 HRS): SARS Coronavirus 2: NEGATIVE

## 2020-03-14 ENCOUNTER — Ambulatory Visit (HOSPITAL_BASED_OUTPATIENT_CLINIC_OR_DEPARTMENT_OTHER): Payer: BC Managed Care – PPO | Admitting: Anesthesiology

## 2020-03-14 ENCOUNTER — Ambulatory Visit
Admission: RE | Admit: 2020-03-14 | Discharge: 2020-03-14 | Disposition: A | Discharge status: Home / Self Care | Payer: BC Managed Care – PPO | Source: Ambulatory Visit | Attending: Radiation Oncology | Admitting: Radiation Oncology

## 2020-03-14 ENCOUNTER — Ambulatory Visit (HOSPITAL_COMMUNITY)
Admission: RE | Admit: 2020-03-14 | Discharge: 2020-03-14 | Disposition: A | Discharge status: Home / Self Care | Payer: BC Managed Care – PPO | Source: Ambulatory Visit | Attending: Radiation Oncology | Admitting: Radiation Oncology

## 2020-03-14 ENCOUNTER — Ambulatory Visit (HOSPITAL_BASED_OUTPATIENT_CLINIC_OR_DEPARTMENT_OTHER)
Admission: RE | Admit: 2020-03-14 | Discharge: 2020-03-14 | Disposition: A | Discharge status: Other Acute Inpatient Hospital | Payer: BC Managed Care – PPO | Attending: Radiation Oncology | Admitting: Radiation Oncology

## 2020-03-14 ENCOUNTER — Encounter (HOSPITAL_BASED_OUTPATIENT_CLINIC_OR_DEPARTMENT_OTHER)
Admission: RE | Disposition: A | Discharge status: Other Acute Inpatient Hospital | Payer: Self-pay | Source: Home / Self Care | Attending: Radiation Oncology

## 2020-03-14 ENCOUNTER — Other Ambulatory Visit: Payer: Self-pay

## 2020-03-14 ENCOUNTER — Encounter (HOSPITAL_BASED_OUTPATIENT_CLINIC_OR_DEPARTMENT_OTHER): Payer: Self-pay | Admitting: Radiation Oncology

## 2020-03-14 VITALS — BP 155/97 | HR 85

## 2020-03-14 DIAGNOSIS — C539 Malignant neoplasm of cervix uteri, unspecified: Secondary | ICD-10-CM | POA: Insufficient documentation

## 2020-03-14 DIAGNOSIS — Z800 Family history of malignant neoplasm of digestive organs: Secondary | ICD-10-CM | POA: Diagnosis not present

## 2020-03-14 DIAGNOSIS — R918 Other nonspecific abnormal finding of lung field: Secondary | ICD-10-CM | POA: Insufficient documentation

## 2020-03-14 DIAGNOSIS — Z79899 Other long term (current) drug therapy: Secondary | ICD-10-CM | POA: Diagnosis not present

## 2020-03-14 DIAGNOSIS — I10 Essential (primary) hypertension: Secondary | ICD-10-CM | POA: Insufficient documentation

## 2020-03-14 DIAGNOSIS — Z886 Allergy status to analgesic agent status: Secondary | ICD-10-CM | POA: Diagnosis not present

## 2020-03-14 DIAGNOSIS — N858 Other specified noninflammatory disorders of uterus: Secondary | ICD-10-CM | POA: Diagnosis not present

## 2020-03-14 DIAGNOSIS — Z9221 Personal history of antineoplastic chemotherapy: Secondary | ICD-10-CM | POA: Insufficient documentation

## 2020-03-14 DIAGNOSIS — Z6835 Body mass index (BMI) 35.0-35.9, adult: Secondary | ICD-10-CM | POA: Insufficient documentation

## 2020-03-14 HISTORY — PX: TANDEM RING INSERTION: SHX6199

## 2020-03-14 HISTORY — PX: OPERATIVE ULTRASOUND: SHX5996

## 2020-03-14 SURGERY — INSERTION, UTERINE TANDEM AND RING OR CYLINDER, FOR BRACHYTHERAPY
Anesthesia: General | Site: Vagina

## 2020-03-14 MED ORDER — HYDROMORPHONE HCL 1 MG/ML IJ SOLN
0.5000 mg | Freq: Once | INTRAMUSCULAR | Status: DC
  Filled 2020-03-14: qty 1

## 2020-03-14 MED ORDER — LACTATED RINGERS IV SOLN: INTRAVENOUS | Status: DC

## 2020-03-14 MED ORDER — AMISULPRIDE (ANTIEMETIC) 5 MG/2ML IV SOLN: 10.0000 mg | Freq: Once | INTRAVENOUS | Status: DC | PRN

## 2020-03-14 MED ORDER — HYDROMORPHONE HCL 1 MG/ML IJ SOLN
0.5000 mg | Freq: Once | INTRAMUSCULAR | Status: AC
  Administered 2020-03-14: 0.5 mg via INTRAVENOUS
  Filled 2020-03-14: qty 1

## 2020-03-14 MED ORDER — OXYCODONE HCL 5 MG/5ML PO SOLN: 5.0000 mg | Freq: Once | ORAL | Status: DC | PRN

## 2020-03-14 MED ORDER — LIDOCAINE 2% (20 MG/ML) 5 ML SYRINGE
INTRAMUSCULAR | Status: AC
  Filled 2020-03-14: qty 5

## 2020-03-14 MED ORDER — ONDANSETRON HCL 4 MG/2ML IJ SOLN
INTRAMUSCULAR | Status: AC
  Filled 2020-03-14: qty 2

## 2020-03-14 MED ORDER — OXYCODONE HCL 5 MG PO TABS: 5.0000 mg | ORAL_TABLET | Freq: Once | ORAL | Status: DC | PRN

## 2020-03-14 MED ORDER — ONDANSETRON HCL 4 MG/2ML IJ SOLN: 4.0000 mg | Freq: Once | INTRAMUSCULAR | Status: DC | PRN

## 2020-03-14 MED ORDER — FENTANYL CITRATE (PF) 100 MCG/2ML IJ SOLN
INTRAMUSCULAR | Status: AC
  Filled 2020-03-14: qty 2

## 2020-03-14 MED ORDER — HYDROMORPHONE HCL 1 MG/ML IJ SOLN
0.5000 mg | INTRAMUSCULAR | Status: AC
  Administered 2020-03-14: 0.5 mg via INTRAVENOUS
  Filled 2020-03-14: qty 1

## 2020-03-14 MED ORDER — ONDANSETRON HCL 4 MG/2ML IJ SOLN
INTRAMUSCULAR | Status: DC | PRN
  Administered 2020-03-14: 4 mg via INTRAVENOUS

## 2020-03-14 MED ORDER — KETOROLAC TROMETHAMINE 30 MG/ML IJ SOLN
INTRAMUSCULAR | Status: AC
  Filled 2020-03-14: qty 1

## 2020-03-14 MED ORDER — MIDAZOLAM HCL 2 MG/2ML IJ SOLN
INTRAMUSCULAR | Status: AC
  Filled 2020-03-14: qty 2

## 2020-03-14 MED ORDER — PROPOFOL 10 MG/ML IV BOLUS
INTRAVENOUS | Status: DC | PRN
  Administered 2020-03-14: 40 mg via INTRAVENOUS
  Administered 2020-03-14: 160 mg via INTRAVENOUS
  Administered 2020-03-14: 50 mg via INTRAVENOUS

## 2020-03-14 MED ORDER — PROPOFOL 10 MG/ML IV BOLUS
INTRAVENOUS | Status: AC
  Filled 2020-03-14: qty 40

## 2020-03-14 MED ORDER — LIDOCAINE 2% (20 MG/ML) 5 ML SYRINGE
INTRAMUSCULAR | Status: DC | PRN
  Administered 2020-03-14: 60 mg via INTRAVENOUS

## 2020-03-14 MED ORDER — SODIUM CHLORIDE 0.9 % IR SOLN
Status: DC | PRN
  Administered 2020-03-14: 1000 mL via INTRAVESICAL

## 2020-03-14 MED ORDER — FENTANYL CITRATE (PF) 100 MCG/2ML IJ SOLN
25.0000 ug | INTRAMUSCULAR | Status: DC | PRN
  Administered 2020-03-14: 50 ug via INTRAVENOUS

## 2020-03-14 MED ORDER — FENTANYL CITRATE (PF) 100 MCG/2ML IJ SOLN
INTRAMUSCULAR | Status: DC | PRN
Start: 2020-03-14 — End: 2020-03-14
  Administered 2020-03-14 (×4): 25 ug via INTRAVENOUS

## 2020-03-14 MED ORDER — MIDAZOLAM HCL 5 MG/5ML IJ SOLN
INTRAMUSCULAR | Status: DC | PRN
  Administered 2020-03-14: 2 mg via INTRAVENOUS

## 2020-03-14 MED ORDER — DEXAMETHASONE SODIUM PHOSPHATE 10 MG/ML IJ SOLN
INTRAMUSCULAR | Status: AC
  Filled 2020-03-14: qty 1

## 2020-03-14 MED ORDER — DEXAMETHASONE SODIUM PHOSPHATE 4 MG/ML IJ SOLN
INTRAMUSCULAR | Status: DC | PRN
  Administered 2020-03-14: 10 mg via INTRAVENOUS

## 2020-03-14 SURGICAL SUPPLY — 24 items
BNDG CONFORM 2 STRL LF (GAUZE/BANDAGES/DRESSINGS) IMPLANT
COVER WAND RF STERILE (DRAPES) ×4 IMPLANT
DILATOR CANAL MILEX (MISCELLANEOUS) IMPLANT
DRSG PAD ABDOMINAL 8X10 ST (GAUZE/BANDAGES/DRESSINGS) ×4 IMPLANT
GAUZE 4X4 16PLY RFD (DISPOSABLE) ×4 IMPLANT
GLOVE BIO SURGEON STRL SZ7.5 (GLOVE) ×8 IMPLANT
GLOVE BIOGEL PI IND STRL 6.5 (GLOVE) ×2 IMPLANT
GLOVE BIOGEL PI IND STRL 7.5 (GLOVE) ×2 IMPLANT
GLOVE BIOGEL PI INDICATOR 6.5 (GLOVE) ×2
GLOVE BIOGEL PI INDICATOR 7.5 (GLOVE) ×2
GLOVE ECLIPSE 6.5 STRL STRAW (GLOVE) ×4 IMPLANT
GOWN STRL REUS W/TWL LRG LVL3 (GOWN DISPOSABLE) ×8 IMPLANT
HOLDER FOLEY CATH W/STRAP (MISCELLANEOUS) ×4 IMPLANT
IV NS 1000ML (IV SOLUTION) ×4
IV NS 1000ML BAXH (IV SOLUTION) ×2 IMPLANT
IV SET EXTENSION GRAVITY 40 LF (IV SETS) ×4 IMPLANT
KIT TURNOVER CYSTO (KITS) ×4 IMPLANT
MAT PREVALON FULL STRYKER (MISCELLANEOUS) ×4 IMPLANT
PACK VAGINAL MINOR WOMEN LF (CUSTOM PROCEDURE TRAY) ×4 IMPLANT
PACKING VAGINAL (PACKING) IMPLANT
PAD OB MATERNITY 4.3X12.25 (PERSONAL CARE ITEMS) IMPLANT
TOWEL OR 17X26 10 PK STRL BLUE (TOWEL DISPOSABLE) ×4 IMPLANT
TRAY FOLEY W/BAG SLVR 14FR LF (SET/KITS/TRAYS/PACK) ×4 IMPLANT
WATER STERILE IRR 500ML POUR (IV SOLUTION) ×4 IMPLANT

## 2020-03-14 NOTE — Progress Notes (Signed)
1020 am Patient transported by stretcher from [place] to Rm 1 of the [facility] with Donnal Moat NT. Patient denies pain. Her IV of LR is infusing at 125 cc/r to her right chest port. Foley intact and draining clear yellow urine to her drainage bag. SCD stocking wraps on and pump disconnected. Patient alert and oriented.  1030 am SCD wraps connected back to the SCD pump.   11 am Patient resting with her eyes closed. Pt. Given a few sips of PO Ginger ale.   1105a  Patient taken to CT Simulation via stretcher.  1138 Patient given 0.5 mg of Dilaudid IV for pain 5 out of 10. BP 155/87 P 85  1150 Patient transported back to rm 1 in Radiation clinic. Patient stating her pelvic and vaginal pain is still a 5 out of 10.  1214 Dilaudid 0.5 mg given IV through her right chest port. !000 cc LR hung at 125 cc/hr.  1240 Patient states her pain is now a 2 out of 10. BP 137/75 P 86  02 sat 99%  1250p Patient given grilled chicken salad she ordered. Sipping on Gingerale. 1305 Patient taken for HDR via stretcher.   1400 Patient back in room # 1 and foley catheter removed with tip intact. 1410 Tandem ring removed by Dr. Sondra Come.  1420 IV fluid disconnected by Butch Penny, RN from right chest port.  1440 Pt. Discharged to first floor for her son to pick her up by w/c. Patient has her d/c instructions and they were reviewed with the patient.   PO fluid in 300 cc IV fluid in 550 cc U/O 900cc

## 2020-03-14 NOTE — Interval H&P Note (Signed)
History and Physical Interval Note:  03/14/2020 8:23 AM  Anne Harris  has presented today for surgery, with the diagnosis of CERVICAL CANCER.  The various methods of treatment have been discussed with the patient and family. After consideration of risks, benefits and other options for treatment, the patient has consented to  Procedure(s): TANDEM RING INSERTION (N/A) OPERATIVE ULTRASOUND (N/A) as a surgical intervention.  The patient's history has been reviewed, patient examined, no change in status, stable for surgery.  I have reviewed the patient's chart and labs.  Questions were answered to the patient's satisfaction.     Gery Pray

## 2020-03-14 NOTE — Anesthesia Postprocedure Evaluation (Signed)
Anesthesia Post Note  Patient: Anne Harris  Procedure(s) Performed: TANDEM RING INSERTION FOR HIGH DOSE RADIATION THERAPY (N/A Vagina ) OPERATIVE ULTRASOUND (N/A Abdomen)     Patient location during evaluation: PACU Anesthesia Type: General Level of consciousness: awake and alert Pain management: pain level controlled Vital Signs Assessment: post-procedure vital signs reviewed and stable Respiratory status: spontaneous breathing, nonlabored ventilation and respiratory function stable Cardiovascular status: blood pressure returned to baseline and stable Postop Assessment: no apparent nausea or vomiting Anesthetic complications: no   No complications documented.  Last Vitals:  Vitals:   03/14/20 1000 03/14/20 1015  BP:  138/80  Pulse: 68 71  Resp: 17 12  Temp:    SpO2: 92% 91%    Last Pain:  Vitals:   03/14/20 0936  TempSrc:   PainSc: 1                  Lidia Collum

## 2020-03-14 NOTE — Progress Notes (Signed)
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 She will be treated with a 45 tandem/ring system with a 6 cm length tandem. This conforms to her anatomy without undue discomfort. A rectal paddle was also part of her custom set up device.  Vaginal brachytherapy procedure node: The patient was brought to the [place] suite. Identity was confirmed. All relevant records and images related to the planned course of therapy were reviewed. The patient freely provided informed written consent to proceed with treatment after reviewing the details related to the planned course of therapy. The consent form was witnessed and verified by the simulation staff. Then, the patient was set-up in a stable reproducible supine position for radiation therapy. The tandem ring system was accessed and fiducial markers were placed within the tandem and ring.   Verification simulation note: An AP and lateral film was obtained through the pelvis area. This was compared to the patient's planning films documenting accurate position of the tandem/ringsystem for treatment.  High-dose-rate brachytherapy treatment note:   The remote afterloading device was accessed through catheter system and attached to the tandem ring system. Patient then proceeded to undergo her third high-dose-rate treatment directed at the cervix. The patient was prescribed a dose of 5.5 gray to be delivered to the high risk clinical target volume.  Patient was treated with 2 channels using 26 dwell positions. Treatment time was 342.0 seconds. The patient tolerated the procedure well. After completion of  her therapy, a radiation survey was performed documenting return of the iridium source into the GammaMed safe. The patient was then transferred to the nursing suite.  She then had removal of the rectal paddle followed by the tandem and ring system. The patient tolerated the removal well.  PLAN: The patient will return next week for her fourth high-dose-rate treatment. ________________________________     Blair Promise, PhD, MD  This document serves as a record of services personally performed by Gery Pray, MD. It was created on his behalf by Clerance Lav, a trained medical scribe. The creation of this record is based on the scribe's personal observations and the provider's statements to them. This document has been checked and approved by the attending provider.

## 2020-03-14 NOTE — Transfer of Care (Signed)
Immediate Anesthesia Transfer of Care Note  Patient: Anne Harris  Procedure(s) Performed: Procedure(s) (LRB): TANDEM RING INSERTION FOR HIGH DOSE RADIATION THERAPY (N/A) OPERATIVE ULTRASOUND (N/A)  Patient Location: PACU  Anesthesia Type: General  Level of Consciousness: awake, sedated, patient cooperative and responds to stimulation  Airway & Oxygen Therapy: Patient Spontanous Breathing and Patient connected to [place] 02 and soft FM   Post-op Assessment: Report given to PACU RN, Post -op Vital signs reviewed and stable and Patient moving all extremities  Post vital signs: Reviewed and stable  Complications: No apparent anesthesia complications

## 2020-03-14 NOTE — Patient Instructions (Signed)
IMMEDIATELY FOLLOWING SURGERY: Do not drive or operate machinery for the first twenty four hours after surgery. Do not make any important decisions for twenty four hours after surgery or while taking narcotic pain medications or sedatives. If you develop intractable nausea and vomiting or a severe headache please notify your doctor immediately.   FOLLOW-UP: You do not need to follow up with anesthesia unless specifically instructed to do so.   WOUND CARE INSTRUCTIONS (if applicable): Expect some mild vaginal bleeding, but if large amount of bleeding occurs please contact Dr. Sondra Come at [phone] or the Radiation On-Call physician. Call for any fever greater than 101.0 degrees or increasing vaginal//abdominal pain or trouble urinating.   QUESTIONS?: Please feel free to call your physician or the hospital operator if you have any questions, and they will be happy to assist you. Resume all medications: as listed on your after visit summary. Your next appointment is:  Future Appointments  Date Time Provider [place]  03/16/2020  9:50 AM Gildardo Pounds, NP [facility] None  03/23/2020  7:00 AM [facility] 1 [facility] [place]  03/23/2020  9:30 AM Gery Pray, MD [facility] None  03/23/2020 11:00 AM Gery Pray, MD [facility] None  03/26/2020  7:00 AM [facility] 1 [facility] [place]  03/27/2020  9:30 AM Gery Pray, MD [facility] None  03/27/2020  1:00 PM Gery Pray, MD [facility] None  03/29/2020 10:15 AM [facility] LAB [facility] None  03/29/2020 10:30 AM [facility] [place] FLUSH [facility] None  03/29/2020 11:00 AM Heath Lark, MD [facility] None  05/28/2020  1:00 PM [facility] DIAG [place] 1 [facility]

## 2020-03-14 NOTE — Anesthesia Preprocedure Evaluation (Addendum)
Anesthesia Evaluation  Patient identified by MRN, date of birth, ID band Patient awake    Reviewed: Allergy & Precautions, NPO status , Patient's Chart, lab work & pertinent test results  History of Anesthesia Complications Negative for: history of anesthetic complications  Airway Mallampati: II  TM Distance: >3 FB Neck ROM: Full    Dental   Pulmonary neg pulmonary ROS,    Pulmonary exam normal        Cardiovascular hypertension, Normal cardiovascular exam     Neuro/Psych negative neurological ROS  negative psych ROS   GI/Hepatic negative GI ROS, Neg liver ROS,   Endo/Other  negative endocrine ROS  Renal/GU negative Renal ROS  negative genitourinary   Musculoskeletal negative musculoskeletal ROS (+)   Abdominal   Peds  Hematology  (+) anemia ,   Anesthesia Other Findings   Reproductive/Obstetrics Cervical cancer                            Anesthesia Physical Anesthesia Plan  ASA: III  Anesthesia Plan: General   Post-op Pain Management:    Induction: Intravenous  PONV Risk Score and Plan: 3 and Ondansetron, Dexamethasone, Midazolam and Treatment may vary due to age or medical condition  Airway Management Planned: LMA  Additional Equipment: None  Intra-op Plan:   Post-operative Plan: Extubation in OR  Informed Consent: I have reviewed the patients History and Physical, chart, labs and discussed the procedure including the risks, benefits and alternatives for the proposed anesthesia with the patient or authorized representative who has indicated his/her understanding and acceptance.     Dental advisory given  Plan Discussed with:   Anesthesia Plan Comments:        Anesthesia Quick Evaluation

## 2020-03-14 NOTE — Brief Op Note (Signed)
03/14/2020  9:40 AM  PATIENT:  Anne Harris  59 y.o. female  PRE-OPERATIVE DIAGNOSIS:  CERVICAL CANCER  POST-OPERATIVE DIAGNOSIS:  CERVICAL CANCER  PROCEDURE:  Procedure(s): TANDEM RING INSERTION FOR HIGH DOSE RADIATION THERAPY (N/A) OPERATIVE ULTRASOUND (N/A)  SURGEON:  Surgeon(s) and Role:    * Gery Pray, MD - Primary  PHYSICIAN ASSISTANT:   ASSISTANTS: none   ANESTHESIA:   general  EBL:  3 mL   BLOOD ADMINISTERED:none  DRAINS: Urinary Catheter (Foley)   LOCAL MEDICATIONS USED:  NONE  SPECIMEN:  No Specimen  DISPOSITION OF SPECIMEN:  N/A  COUNTS:  YES  TOURNIQUET:  * No tourniquets in log *  DICTATION: Patient was taken to the outpatient surgical suiteOR, room #4. The patient was prepped and draped in the usual sterile fashion and placed in the dorsolithotomy position. Timeout for the procedure,estimated length of the procedure and preoperative medications was performed. A Foley catheter was placed without difficulty. For imaging purposes approximately 300 cc of sterile water was placed within the bladder.  Excellent ultrasound images were obtained.  Exam under anesthesia revealed an excellent response to the patient's radiation therapy (external beam and radiosensitizing chemotherapy). The cervical region was estimated to be approximately 3 x 2.75 cm. No obvious parametrial involvement at this time. Some slight nodularity noted to the cervix region at the location of the cervical os.  The patient then proceeded to undergo sounding of the uterus. The uterus was estimated to be approximately ~ 7 cm in length. This was then followed by dilation of the cervical os without difficulty. A 60 mm cervical sleeve was placed within the endocervical canal and endometrial canal. I then placed a 25mm 45 degree tandem within the cervical sleeve. A 45 degree cervical ring was then attached to the tandem. This had a small shielding Covering the ringportion.  Patient then had a rectal paddle placed posteriorly. Ultrasound imaging at the completion of the procedure showed good placement of the tandem and ring for high-dose-rate radiation therapy. After recovery from anesthesia the patient will be transferred to radiation oncology for CT simulation with tandem/ ring in place. She will later in thr day undergo her third high-dose-rate treatment directed to the cervical region. She will receive 5.5 Gy to the high risk clinical target volume. Plan is for her to receive 2 additional high-dose-rate brachytherapy treatments. Iridium 192 will be the high-dose-rate source.   PLAN OF CARE: Transferred to radiation oncology for planning and treatment  PATIENT DISPOSITION:  PACU - hemodynamically stable.   Delay start of Pharmacological VTE agent (>24hrs) due to surgical blood loss or risk of bleeding: not applicable

## 2020-03-14 NOTE — Discharge Instructions (Signed)

## 2020-03-14 NOTE — Anesthesia Procedure Notes (Signed)
Procedure Name: LMA Insertion Date/Time: 03/14/2020 8:49 AM Performed by: Justice Rocher, CRNA Pre-anesthesia Checklist: Patient identified, Emergency Drugs available, Suction available, Patient being monitored and Timeout performed Patient Re-evaluated:Patient Re-evaluated prior to induction Oxygen Delivery Method: Circle system utilized Preoxygenation: Pre-oxygenation with 100% oxygen Induction Type: IV induction Ventilation: Mask ventilation without difficulty LMA: LMA inserted LMA Size: 4.0 Number of attempts: 1 Airway Equipment and Method: Bite block Placement Confirmation: positive ETCO2,  breath sounds checked- equal and bilateral and CO2 detector Tube secured with: Tape Dental Injury: Teeth and Oropharynx as per pre-operative assessment  Comments: Teeth as preop

## 2020-03-14 NOTE — Progress Notes (Signed)
Patient reports vaginal pain 8 on a scale of 0-10 related to placement of ring and tandem. Per Dr. Clabe Seal order this RN administered Dilaudid 0.5 mg IV. Patient tolerated well.

## 2020-03-15 ENCOUNTER — Encounter (HOSPITAL_BASED_OUTPATIENT_CLINIC_OR_DEPARTMENT_OTHER): Payer: Self-pay | Admitting: Radiation Oncology

## 2020-03-16 ENCOUNTER — Ambulatory Visit: Payer: BC Managed Care – PPO | Admitting: Nurse Practitioner

## 2020-03-19 ENCOUNTER — Other Ambulatory Visit: Payer: Self-pay

## 2020-03-19 ENCOUNTER — Encounter (HOSPITAL_BASED_OUTPATIENT_CLINIC_OR_DEPARTMENT_OTHER): Payer: Self-pay | Admitting: Radiation Oncology

## 2020-03-19 NOTE — Progress Notes (Signed)
Spoke w/ via phone for pre-op interview---pt Lab needs dos----   none            Lab results------ekg 12-15-19 COVID test ------03-21-20 1000 Arrive at -------530 Am NPO after MN NO Solid Food.  Clear liquids from MN until---430 am Medications to take morning of surgery -----amlodipine Diabetic medication ----n/a- Patient Special Instructions -----none Pre-Op special Istructions -----none Patient verbalized understanding of instructions that were given at this phone interview. Patient denies shortness of breath, chest pain, fever, cough at this phone interview.

## 2020-03-21 ENCOUNTER — Other Ambulatory Visit (HOSPITAL_COMMUNITY)
Admission: RE | Admit: 2020-03-21 | Discharge: 2020-03-21 | Disposition: A | Discharge status: Home / Self Care | Payer: BC Managed Care – PPO | Source: Ambulatory Visit | Attending: Radiation Oncology | Admitting: Radiation Oncology

## 2020-03-21 DIAGNOSIS — Z01812 Encounter for preprocedural laboratory examination: Secondary | ICD-10-CM | POA: Diagnosis not present

## 2020-03-21 DIAGNOSIS — Z20822 Contact with and (suspected) exposure to covid-19: Secondary | ICD-10-CM | POA: Diagnosis not present

## 2020-03-21 LAB — SARS CORONAVIRUS 2 (TAT 6-24 HRS): SARS Coronavirus 2: NEGATIVE

## 2020-03-22 NOTE — Progress Notes (Signed)
Pt aware of arrival time of 0715.

## 2020-03-23 ENCOUNTER — Ambulatory Visit
Admission: RE | Admit: 2020-03-23 | Discharge: 2020-03-23 | Disposition: A | Discharge status: Home / Self Care | Payer: BC Managed Care – PPO | Source: Ambulatory Visit | Attending: Radiation Oncology | Admitting: Radiation Oncology

## 2020-03-23 ENCOUNTER — Encounter (HOSPITAL_BASED_OUTPATIENT_CLINIC_OR_DEPARTMENT_OTHER): Payer: Self-pay | Admitting: Radiation Oncology

## 2020-03-23 ENCOUNTER — Ambulatory Visit (HOSPITAL_BASED_OUTPATIENT_CLINIC_OR_DEPARTMENT_OTHER): Payer: BC Managed Care – PPO | Admitting: Anesthesiology

## 2020-03-23 ENCOUNTER — Ambulatory Visit (HOSPITAL_COMMUNITY)
Admission: RE | Admit: 2020-03-23 | Discharge: 2020-03-23 | Disposition: A | Discharge status: Home / Self Care | Payer: BC Managed Care – PPO | Source: Ambulatory Visit | Attending: Radiation Oncology | Admitting: Radiation Oncology

## 2020-03-23 ENCOUNTER — Encounter (HOSPITAL_BASED_OUTPATIENT_CLINIC_OR_DEPARTMENT_OTHER)
Admission: RE | Disposition: A | Discharge status: Home / Self Care | Payer: Self-pay | Source: Home / Self Care | Attending: Radiation Oncology

## 2020-03-23 ENCOUNTER — Ambulatory Visit (HOSPITAL_BASED_OUTPATIENT_CLINIC_OR_DEPARTMENT_OTHER)
Admission: RE | Admit: 2020-03-23 | Discharge: 2020-03-23 | Disposition: A | Discharge status: Home / Self Care | Payer: BC Managed Care – PPO | Attending: Radiation Oncology | Admitting: Radiation Oncology

## 2020-03-23 ENCOUNTER — Encounter: Payer: Self-pay | Admitting: Hematology and Oncology

## 2020-03-23 DIAGNOSIS — Z9221 Personal history of antineoplastic chemotherapy: Secondary | ICD-10-CM | POA: Diagnosis not present

## 2020-03-23 DIAGNOSIS — C539 Malignant neoplasm of cervix uteri, unspecified: Secondary | ICD-10-CM

## 2020-03-23 DIAGNOSIS — I10 Essential (primary) hypertension: Secondary | ICD-10-CM | POA: Diagnosis not present

## 2020-03-23 DIAGNOSIS — Z886 Allergy status to analgesic agent status: Secondary | ICD-10-CM | POA: Insufficient documentation

## 2020-03-23 DIAGNOSIS — Z6835 Body mass index (BMI) 35.0-35.9, adult: Secondary | ICD-10-CM | POA: Insufficient documentation

## 2020-03-23 DIAGNOSIS — Z79899 Other long term (current) drug therapy: Secondary | ICD-10-CM | POA: Diagnosis not present

## 2020-03-23 HISTORY — PX: TANDEM RING INSERTION: SHX6199

## 2020-03-23 HISTORY — PX: OPERATIVE ULTRASOUND: SHX5996

## 2020-03-23 SURGERY — INSERTION, UTERINE TANDEM AND RING OR CYLINDER, FOR BRACHYTHERAPY
Anesthesia: General | Site: Vagina

## 2020-03-23 MED ORDER — DEXAMETHASONE SODIUM PHOSPHATE 10 MG/ML IJ SOLN
INTRAMUSCULAR | Status: DC | PRN
  Administered 2020-03-23: 10 mg via INTRAVENOUS

## 2020-03-23 MED ORDER — POVIDONE-IODINE 10 % EX SWAB: 2.0000 "application " | Freq: Once | CUTANEOUS | Status: DC

## 2020-03-23 MED ORDER — HEPARIN SOD (PORK) LOCK FLUSH 100 UNIT/ML IV SOLN
500.0000 [IU] | Freq: Once | INTRAVENOUS | Status: AC
  Administered 2020-03-23: 500 [IU] via INTRAVENOUS

## 2020-03-23 MED ORDER — ACETAMINOPHEN 500 MG PO TABS
ORAL_TABLET | ORAL | Status: AC
  Filled 2020-03-23: qty 2

## 2020-03-23 MED ORDER — HYDROMORPHONE HCL 1 MG/ML IJ SOLN
0.5000 mg | Freq: Once | INTRAMUSCULAR | Status: AC
  Administered 2020-03-23: 0.5 mg via INTRAVENOUS
  Filled 2020-03-23: qty 1

## 2020-03-23 MED ORDER — ACETAMINOPHEN 500 MG PO TABS
1000.0000 mg | ORAL_TABLET | Freq: Once | ORAL | Status: AC
  Administered 2020-03-23: 1000 mg via ORAL

## 2020-03-23 MED ORDER — ONDANSETRON HCL 4 MG/2ML IJ SOLN
INTRAMUSCULAR | Status: AC
  Filled 2020-03-23: qty 2

## 2020-03-23 MED ORDER — LIDOCAINE 2% (20 MG/ML) 5 ML SYRINGE
INTRAMUSCULAR | Status: AC
  Filled 2020-03-23: qty 5

## 2020-03-23 MED ORDER — FENTANYL CITRATE (PF) 100 MCG/2ML IJ SOLN
INTRAMUSCULAR | Status: AC
  Filled 2020-03-23: qty 2

## 2020-03-23 MED ORDER — HYDROMORPHONE HCL 1 MG/ML IJ SOLN
INTRAMUSCULAR | Status: AC
  Filled 2020-03-23: qty 1

## 2020-03-23 MED ORDER — PROPOFOL 10 MG/ML IV BOLUS
INTRAVENOUS | Status: AC
  Filled 2020-03-23: qty 20

## 2020-03-23 MED ORDER — FENTANYL CITRATE (PF) 100 MCG/2ML IJ SOLN
25.0000 ug | INTRAMUSCULAR | Status: DC | PRN
  Administered 2020-03-23: 25 ug via INTRAVENOUS

## 2020-03-23 MED ORDER — SODIUM CHLORIDE 0.9 % IR SOLN
Status: DC | PRN
  Administered 2020-03-23: 300 mL

## 2020-03-23 MED ORDER — PROPOFOL 10 MG/ML IV BOLUS
INTRAVENOUS | Status: DC | PRN
  Administered 2020-03-23: 150 mg via INTRAVENOUS

## 2020-03-23 MED ORDER — SODIUM CHLORIDE 0.9% FLUSH
10.0000 mL | Freq: Once | INTRAVENOUS | Status: AC
  Administered 2020-03-23: 10 mL via INTRAVENOUS

## 2020-03-23 MED ORDER — MIDAZOLAM HCL 2 MG/2ML IJ SOLN
INTRAMUSCULAR | Status: DC | PRN
  Administered 2020-03-23: 2 mg via INTRAVENOUS

## 2020-03-23 MED ORDER — DEXAMETHASONE SODIUM PHOSPHATE 10 MG/ML IJ SOLN
INTRAMUSCULAR | Status: AC
  Filled 2020-03-23: qty 1

## 2020-03-23 MED ORDER — PROMETHAZINE HCL 25 MG/ML IJ SOLN: 6.2500 mg | INTRAMUSCULAR | Status: DC | PRN

## 2020-03-23 MED ORDER — FENTANYL CITRATE (PF) 100 MCG/2ML IJ SOLN
INTRAMUSCULAR | Status: DC | PRN
Start: 2020-03-23 — End: 2020-03-23
  Administered 2020-03-23: 25 ug via INTRAVENOUS
  Administered 2020-03-23: 75 ug via INTRAVENOUS

## 2020-03-23 MED ORDER — LIDOCAINE 2% (20 MG/ML) 5 ML SYRINGE
INTRAMUSCULAR | Status: DC | PRN
  Administered 2020-03-23: 100 mg via INTRAVENOUS

## 2020-03-23 MED ORDER — MIDAZOLAM HCL 2 MG/2ML IJ SOLN
INTRAMUSCULAR | Status: AC
  Filled 2020-03-23: qty 2

## 2020-03-23 MED ORDER — ONDANSETRON HCL 4 MG/2ML IJ SOLN
INTRAMUSCULAR | Status: DC | PRN
  Administered 2020-03-23: 4 mg via INTRAVENOUS

## 2020-03-23 MED ORDER — LACTATED RINGERS IV SOLN: INTRAVENOUS | Status: DC

## 2020-03-23 SURGICAL SUPPLY — 25 items
BIOGEL INDICATOR UNDERGLOVE ×4 IMPLANT
BNDG CONFORM 2 STRL LF (GAUZE/BANDAGES/DRESSINGS) IMPLANT
COVER WAND RF STERILE (DRAPES) ×4 IMPLANT
DILATOR CANAL MILEX (MISCELLANEOUS) IMPLANT
DRSG PAD ABDOMINAL 8X10 ST (GAUZE/BANDAGES/DRESSINGS) ×4 IMPLANT
GAUZE 4X4 16PLY RFD (DISPOSABLE) IMPLANT
GLOVE BIO SURGEON STRL SZ 6.5 (GLOVE) ×3 IMPLANT
GLOVE BIO SURGEON STRL SZ7.5 (GLOVE) ×8 IMPLANT
GLOVE BIO SURGEONS STRL SZ 6.5 (GLOVE) ×1
GLOVE SURG SS PI 7.0 STRL IVOR (GLOVE) ×4 IMPLANT
GOWN STRL REUS W/ TWL LRG LVL3 (GOWN DISPOSABLE) ×2 IMPLANT
GOWN STRL REUS W/TWL LRG LVL3 (GOWN DISPOSABLE) ×8 IMPLANT
HOLDER FOLEY CATH W/STRAP (MISCELLANEOUS) ×4 IMPLANT
HOVERMATT SINGLE USE (MISCELLANEOUS) IMPLANT
IV NS 1000ML (IV SOLUTION) ×4
IV NS 1000ML BAXH (IV SOLUTION) ×2 IMPLANT
IV SET EXTENSION GRAVITY 40 LF (IV SETS) ×4 IMPLANT
KIT TURNOVER CYSTO (KITS) ×4 IMPLANT
MAT PREVALON FULL STRYKER (MISCELLANEOUS) ×4 IMPLANT
PACK VAGINAL MINOR WOMEN LF (CUSTOM PROCEDURE TRAY) ×4 IMPLANT
PACKING VAGINAL (PACKING) IMPLANT
PAD OB MATERNITY 4.3X12.25 (PERSONAL CARE ITEMS) IMPLANT
TOWEL OR 17X26 10 PK STRL BLUE (TOWEL DISPOSABLE) ×4 IMPLANT
TRAY FOLEY W/BAG SLVR 14FR LF (SET/KITS/TRAYS/PACK) ×4 IMPLANT
WATER STERILE IRR 500ML POUR (IV SOLUTION) IMPLANT

## 2020-03-23 NOTE — Interval H&P Note (Signed)
Patient examined and ready to proceed with OR procedure.

## 2020-03-23 NOTE — Anesthesia Procedure Notes (Signed)
Procedure Name: LMA Insertion Date/Time: 03/23/2020 9:26 AM Performed by: Mechele Claude, CRNA Pre-anesthesia Checklist: Patient identified, Emergency Drugs available, Suction available and Patient being monitored Patient Re-evaluated:Patient Re-evaluated prior to induction Oxygen Delivery Method: Circle system utilized Preoxygenation: Pre-oxygenation with 100% oxygen Induction Type: IV induction Ventilation: Mask ventilation without difficulty LMA: LMA inserted LMA Size: 4.0 Number of attempts: 1 Airway Equipment and Method: Bite block Placement Confirmation: positive ETCO2 Tube secured with: Tape Dental Injury: Teeth and Oropharynx as per pre-operative assessment  Comments: Loose front left bottom tooth still intact.

## 2020-03-23 NOTE — Progress Notes (Signed)
1040 am Patient transported from [facility] PACU to [place] via stretcher with Rise Patience, NT. IV infusing to right chest port at 125 cc/hr of LR. Foley catheter draining light yellow urine into drainage bag. Patient awake and alert. SCD wraps on hr lower legs. HOB flat and patient is supine. 1100 Patient  brought to room 1 in the radiation clinic for observation. SCD pump connected to patient wraps and pump turned on. LR with tubing connected to alaris pump. Patient denies any discomfort. 1120 Lunch order placed for patient  And HOB increased to 30 degrees. Patient given lemon lime [place] to drink. 1140 am lunch served. Patient reports she is comfortable. 1220 Patient awake texting on her phone. Patient reports she is starting to become uncomfortable. Advised will obtain an order for pain medication. 1235 Patient given 0.5 mg of dilaudid IV per verbal order of Dr. Sondra Come. 1245 Patient  states her pain has gone from a 5 out of 10 to a 2 out of 0. 1255 Patient taken by RT staff on her stretcher  to the [place] room for her procedure.  1410 Patient back in room 1 in the radiation clinic. 1415 Foley catheter removed tip intact.  1420 Tandem ring equipment removed by Dr. Sondra Come without problems.  [place] on right chest deaccessed by Egbert Garibaldi RN.  Total LR  IV fluid in 300 cc PO 480 cc  and urine output from foley 650 cc.  1445 Patient taken in w/c from rm 1 to the first floor lobby to be picked up by a family member. Patient requested to talk with a financial advocate prior to leaving and stopped at the from desk to talk with her. Patient denies any discomfort and d/c orders reviewed from PACU. Patient verbalized understanding.

## 2020-03-23 NOTE — Discharge Instructions (Signed)

## 2020-03-23 NOTE — Progress Notes (Signed)
  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 Identity was confirmed. All relevant records and images related to the planned course of therapy were reviewed. The patient freely provided informed written consent to proceed with treatment after reviewing the details related to the planned course of therapy. The consent form was witnessed and verified by the simulation staff. Then, the patient was set-up in a stable reproducible supine position for radiation therapy. The tandem ring system was accessed and fiducial markers were placed within the tandem and ring.   Simple treatment device note: On the operating room the patient had construction of her custom tandem ring system. She will be treated with a 45 tandem/ring system. The patient had placement of a 60 mm tandem. A cervical ring with a small shielding was used for her treatment. A rectal paddle was also part of her custom set up device.  Verification simulation note: An AP and lateral film was obtained through the pelvis area. This was compared to the patient's planning films documenting accurate position of the tandem/ring system for treatment.  High-dose-rate brachytherapy treatment note:  The remote afterloading device was accessed through catheter system and attached to the tandem ring system. Patient then proceeded to undergo her fourth high-dose-rate treatment directed at the cervix. The patient was prescribed a dose of 5.5 gray to be delivered to the [place].Marland Kitchen Patient was treated with 2 channels using 25 dwell positions. Treatment time was 414.7 seconds. The patient tolerated the procedure well. After completion of her therapy, a radiation  survey was performed documenting return of the iridium source into the GammaMed safe. The patient was then transferred to the nursing suite. She then had removal of the rectal paddle followed by the tandem and ring system. The patient tolerated the removal well.  PLAN: She will return on Tuesday October 26th for her fifth and final HDR treatment. ________________________________  Blair Promise, PhD, MD

## 2020-03-23 NOTE — Anesthesia Preprocedure Evaluation (Addendum)
Anesthesia Evaluation  Patient identified by MRN, date of birth, ID band Patient awake    Reviewed: Allergy & Precautions, NPO status , Patient's Chart, lab work & pertinent test results  History of Anesthesia Complications Negative for: history of anesthetic complications  Airway Mallampati: II  TM Distance: >3 FB Neck ROM: Full    Dental  (+)  Loose lower teeth 2/2 gum disease :   Pulmonary neg pulmonary ROS,    Pulmonary exam normal        Cardiovascular hypertension, Normal cardiovascular exam  EKG reviewed: NSR   Neuro/Psych negative neurological ROS  negative psych ROS   GI/Hepatic negative GI ROS, Neg liver ROS,   Endo/Other  Morbid obesity  Renal/GU negative Renal ROS  negative genitourinary   Musculoskeletal negative musculoskeletal ROS (+)   Abdominal   Peds negative pediatric ROS (+)  Hematology  (+) anemia ,   Anesthesia Other Findings   Reproductive/Obstetrics Cervical cancer                           Anesthesia Physical  Anesthesia Plan  ASA: III  Anesthesia Plan: General   Post-op Pain Management:    Induction: Intravenous  PONV Risk Score and Plan: 3 and Ondansetron, Dexamethasone, Midazolam and Treatment may vary due to age or medical condition  Airway Management Planned: LMA  Additional Equipment: None  Intra-op Plan:   Post-operative Plan: Extubation in OR  Informed Consent: I have reviewed the patients History and Physical, chart, labs and discussed the procedure including the risks, benefits and alternatives for the proposed anesthesia with the patient or authorized representative who has indicated his/her understanding and acceptance.     Dental advisory given  Plan Discussed with: Anesthesiologist, CRNA and Surgeon  Anesthesia Plan Comments:        Anesthesia Quick Evaluation

## 2020-03-23 NOTE — Transfer of Care (Signed)
  Last Vitals:  Vitals Value Taken Time  BP 154/92 03/23/20 1001  Temp    Pulse 73 03/23/20 1005  Resp 14 03/23/20 1005  SpO2 98 % 03/23/20 1005  Vitals shown include unvalidated device data.  Last Pain:  Vitals:   03/23/20 0713  TempSrc: Oral  PainSc: 0-No pain      Patients Stated Pain Goal: 2 (03/23/20 3825)  Immediate Anesthesia Transfer of Care Note  Patient: Anne Harris  Procedure(s) Performed: Procedure(s) (LRB): TANDEM RING INSERTION (N/A) OPERATIVE ULTRASOUND (N/A)  Patient Location: PACU  Anesthesia Type: General  Level of Consciousness: awake, alert  and oriented  Airway & Oxygen Therapy: Patient Spontanous Breathing and Patient connected to nasal cannula oxygen  Post-op Assessment: Report given to PACU RN and Post -op Vital signs reviewed and stable  Post vital signs: Reviewed and stable  Complications: No apparent anesthesia complications

## 2020-03-23 NOTE — Progress Notes (Signed)
Met with patient whom previously brought proof of income for [employer] grant to obtain signature.  Patient approved for one-time $1000 [employer] grant to assist with personal expenses while going through treatment. She has a copy of the approval letter and expense sheet along with the Outpatient pharmacy information. Discussed expenses and how they are covered.  Gave her my card for any additional financial questions or concerns.

## 2020-03-23 NOTE — Op Note (Signed)
03/23/2020  10:39 AM  PATIENT:  Anne Harris  59 y.o. female  PRE-OPERATIVE DIAGNOSIS:  CERVICAL CANCER  POST-OPERATIVE DIAGNOSIS:  CERVICAL CANCER  PROCEDURE:  Procedure(s): TANDEM RING INSERTION (N/A) OPERATIVE ULTRASOUND (N/A)  SURGEON:  Surgeon(s) and Role:    * Gery Pray, MD - Primary  PHYSICIAN ASSISTANT:   ASSISTANTS: none   ANESTHESIA:   general  EBL:  5 mL   BLOOD ADMINISTERED:none  DRAINS: Urinary Catheter (Foley)   LOCAL MEDICATIONS USED:  NONE  SPECIMEN:  No Specimen  DISPOSITION OF SPECIMEN:  N/A  COUNTS:  YES  TOURNIQUET:  * No tourniquets in log *  DICTATION: Patient was taken to the outpatient surgical suiteOR, room #4. The patient was prepped and draped in the usual sterile fashion and placed in the dorsolithotomy position. Timeout for the procedure,estimated length of the procedure and preoperative medications was performed. A Foley catheter was placed without difficulty. For imaging purposes approximately 250 cc of normal saline was placed within the bladder.  Excellent ultrasound images were obtained.  Exam under anesthesia revealed an excellent response to the patient's radiation therapy (external beam and radiosensitizing chemotherapy). The cervical region was estimated to be approximately 2.5 x 2.50 cm. No obvious parametrial involvement at this time. Some slight nodularity noted to the cervix region at the location of the cervical os.  The patient then proceeded to undergo sounding of the uterus. The uterus was estimated to be approximately ~ 7 cm in length. This was then followed by dilation of the cervical os without difficulty. A 60 mm cervical sleeve was placed within the endocervical canal and endometrial canal. I then placed a 60mm 45 degree tandem within the cervical sleeve. A 45 degree cervical ring was then attached to the tandem. This had a small shielding cap covering the ringportion. Patient then had a rectal  paddle placed posteriorly. Ultrasound imaging at the completion of the procedure showed good placement of the tandem and ring for high-dose-rate radiation therapy. After recovery from anesthesia the patient will be transferred to radiation oncology for CT simulation with tandem/ ring in place. She will later in thr day undergo her fourth high-dose-rate treatment directed to the cervical region. She will receive 5.5 Gy to the high risk clinical target volume. Plan is for her to receive 1 additional high-dose-rate brachytherapy treatment. Iridium 192 will be the high-dose-rate source.   PLAN OF CARE: Transferred to radiation oncology for planning and treatment  PATIENT DISPOSITION:  PACU - hemodynamically stable.   Delay start of Pharmacological VTE agent (>24hrs) due to surgical blood loss or risk of bleeding: not applicable

## 2020-03-26 ENCOUNTER — Ambulatory Visit: Payer: BC Managed Care – PPO | Admitting: Radiation Oncology

## 2020-03-26 ENCOUNTER — Encounter (HOSPITAL_BASED_OUTPATIENT_CLINIC_OR_DEPARTMENT_OTHER): Payer: Self-pay | Admitting: Radiation Oncology

## 2020-03-26 ENCOUNTER — Ambulatory Visit (HOSPITAL_COMMUNITY): Admission: RE | Admit: 2020-03-26 | Payer: BC Managed Care – PPO | Source: Ambulatory Visit

## 2020-03-26 ENCOUNTER — Other Ambulatory Visit (HOSPITAL_COMMUNITY)
Admission: RE | Admit: 2020-03-26 | Discharge: 2020-03-26 | Disposition: A | Discharge status: Home / Self Care | Payer: BC Managed Care – PPO | Source: Ambulatory Visit | Attending: Radiation Oncology | Admitting: Radiation Oncology

## 2020-03-26 ENCOUNTER — Other Ambulatory Visit: Payer: Self-pay

## 2020-03-26 DIAGNOSIS — Z886 Allergy status to analgesic agent status: Secondary | ICD-10-CM | POA: Diagnosis not present

## 2020-03-26 DIAGNOSIS — Z800 Family history of malignant neoplasm of digestive organs: Secondary | ICD-10-CM | POA: Diagnosis not present

## 2020-03-26 DIAGNOSIS — C539 Malignant neoplasm of cervix uteri, unspecified: Secondary | ICD-10-CM | POA: Diagnosis not present

## 2020-03-26 DIAGNOSIS — Z20822 Contact with and (suspected) exposure to covid-19: Secondary | ICD-10-CM | POA: Diagnosis not present

## 2020-03-26 LAB — SARS CORONAVIRUS 2 (TAT 6-24 HRS): SARS Coronavirus 2: NEGATIVE

## 2020-03-26 NOTE — Anesthesia Preprocedure Evaluation (Addendum)
Anesthesia Evaluation  Patient identified by MRN, date of birth, ID band Patient awake    Reviewed: Allergy & Precautions, NPO status , Patient's Chart, lab work & pertinent test results  Airway Mallampati: II  TM Distance: >3 FB Neck ROM: Full    Dental no notable dental hx. (+) Teeth Intact, Missing,    Pulmonary neg pulmonary ROS,    Pulmonary exam normal breath sounds clear to auscultation       Cardiovascular hypertension, Pt. on medications Normal cardiovascular exam Rhythm:Regular Rate:Normal     Neuro/Psych negative neurological ROS  negative psych ROS   GI/Hepatic negative GI ROS, Neg liver ROS,   Endo/Other  negative endocrine ROS  Renal/GU negative Renal ROSK+ 3.6 Cr 0.78     Musculoskeletal negative musculoskeletal ROS (+)   Abdominal (+) + obese,   Peds  Hematology negative hematology ROS (+) Hgb 10.9 Plt 269   Anesthesia Other Findings All NSAIDS  Reproductive/Obstetrics                            Anesthesia Physical Anesthesia Plan  ASA: III  Anesthesia Plan: General   Post-op Pain Management:    Induction: Intravenous  PONV Risk Score and Plan: 4 or greater and Treatment may vary due to age or medical condition, Ondansetron, Dexamethasone and Midazolam  Airway Management Planned: LMA  Additional Equipment: None  Intra-op Plan:   Post-operative Plan: Extubation in OR  Informed Consent: I have reviewed the patients History and Physical, chart, labs and discussed the procedure including the risks, benefits and alternatives for the proposed anesthesia with the patient or authorized representative who has indicated his/her understanding and acceptance.     Dental advisory given  Plan Discussed with: CRNA and Anesthesiologist  Anesthesia Plan Comments: (LMA GA)      Anesthesia Quick Evaluation

## 2020-03-26 NOTE — Anesthesia Postprocedure Evaluation (Signed)
Anesthesia Post Note  Patient: Anne Harris  Procedure(s) Performed: TANDEM RING INSERTION (N/A Vagina ) OPERATIVE ULTRASOUND (N/A Abdomen)     Patient location during evaluation: PACU Anesthesia Type: General Level of consciousness: sedated Pain management: pain level controlled Vital Signs Assessment: post-procedure vital signs reviewed and stable Respiratory status: spontaneous breathing and respiratory function stable Cardiovascular status: stable Postop Assessment: no apparent nausea or vomiting Anesthetic complications: no   No complications documented.  Last Vitals:  Vitals:   03/23/20 1015 03/23/20 1030  BP: (!) 149/89 (!) 152/88  Pulse: 69 75  Resp: 13 15  Temp:    SpO2: 100% 93%    Last Pain:  Vitals:   03/23/20 1030  TempSrc:   PainSc: 2                  Candra R Karmella Bouvier

## 2020-03-26 NOTE — Progress Notes (Signed)
Spoke w/ via phone for pre-op interview---pt Lab needs dos---- none              Lab results------ekg 12-15-2019 epic COVID test ------03-26-2020 1200 Arrive at -------530 am 03-27-2020 NPO after MN NO Solid Food.  Clear liquids from MN until---430 am then npo Medications to take morning of surgery -----amlodipine Diabetic medication -----n/a Patient Special Instructions -----none Pre-Op special Istructions -----none Patient verbalized understanding of instructions that were given at this phone interview. Patient denies shortness of breath, chest pain, fever, cough at this phone interview.

## 2020-03-27 ENCOUNTER — Ambulatory Visit
Admission: RE | Admit: 2020-03-27 | Discharge: 2020-03-27 | Disposition: A | Discharge status: Home / Self Care | Payer: BC Managed Care – PPO | Source: Ambulatory Visit | Attending: Radiation Oncology | Admitting: Radiation Oncology

## 2020-03-27 ENCOUNTER — Encounter (HOSPITAL_BASED_OUTPATIENT_CLINIC_OR_DEPARTMENT_OTHER): Payer: Self-pay | Admitting: Radiation Oncology

## 2020-03-27 ENCOUNTER — Ambulatory Visit (HOSPITAL_BASED_OUTPATIENT_CLINIC_OR_DEPARTMENT_OTHER): Payer: BC Managed Care – PPO | Admitting: Anesthesiology

## 2020-03-27 ENCOUNTER — Ambulatory Visit (HOSPITAL_BASED_OUTPATIENT_CLINIC_OR_DEPARTMENT_OTHER)
Admission: RE | Admit: 2020-03-27 | Discharge: 2020-03-27 | Disposition: A | Discharge status: Other Acute Inpatient Hospital | Payer: BC Managed Care – PPO | Attending: Radiation Oncology | Admitting: Radiation Oncology

## 2020-03-27 ENCOUNTER — Ambulatory Visit (HOSPITAL_COMMUNITY)
Admission: RE | Admit: 2020-03-27 | Discharge: 2020-03-27 | Disposition: A | Discharge status: Home / Self Care | Payer: BC Managed Care – PPO | Source: Ambulatory Visit | Attending: Radiation Oncology | Admitting: Radiation Oncology

## 2020-03-27 ENCOUNTER — Encounter (HOSPITAL_BASED_OUTPATIENT_CLINIC_OR_DEPARTMENT_OTHER)
Admission: RE | Disposition: A | Discharge status: Other Acute Inpatient Hospital | Payer: Self-pay | Source: Home / Self Care | Attending: Radiation Oncology

## 2020-03-27 DIAGNOSIS — C539 Malignant neoplasm of cervix uteri, unspecified: Secondary | ICD-10-CM | POA: Diagnosis not present

## 2020-03-27 DIAGNOSIS — Z886 Allergy status to analgesic agent status: Secondary | ICD-10-CM | POA: Insufficient documentation

## 2020-03-27 DIAGNOSIS — Z20822 Contact with and (suspected) exposure to covid-19: Secondary | ICD-10-CM | POA: Insufficient documentation

## 2020-03-27 DIAGNOSIS — Z800 Family history of malignant neoplasm of digestive organs: Secondary | ICD-10-CM | POA: Insufficient documentation

## 2020-03-27 HISTORY — PX: OPERATIVE ULTRASOUND: SHX5996

## 2020-03-27 HISTORY — PX: TANDEM RING INSERTION: SHX6199

## 2020-03-27 SURGERY — INSERTION, UTERINE TANDEM AND RING OR CYLINDER, FOR BRACHYTHERAPY
Anesthesia: General | Site: Vagina

## 2020-03-27 MED ORDER — DEXAMETHASONE SODIUM PHOSPHATE 10 MG/ML IJ SOLN
INTRAMUSCULAR | Status: AC
  Filled 2020-03-27: qty 1

## 2020-03-27 MED ORDER — ONDANSETRON HCL 4 MG/2ML IJ SOLN
INTRAMUSCULAR | Status: AC
  Filled 2020-03-27: qty 2

## 2020-03-27 MED ORDER — HYDROMORPHONE HCL 1 MG/ML IJ SOLN
0.5000 mg | Freq: Once | INTRAMUSCULAR | Status: AC
  Administered 2020-03-27: 0.5 mg via INTRAVENOUS
  Filled 2020-03-27: qty 1

## 2020-03-27 MED ORDER — FENTANYL CITRATE (PF) 250 MCG/5ML IJ SOLN
INTRAMUSCULAR | Status: AC
  Filled 2020-03-27: qty 5

## 2020-03-27 MED ORDER — LIDOCAINE 2% (20 MG/ML) 5 ML SYRINGE
INTRAMUSCULAR | Status: AC
  Filled 2020-03-27: qty 5

## 2020-03-27 MED ORDER — OXYCODONE HCL 5 MG/5ML PO SOLN: 5.0000 mg | Freq: Once | ORAL | Status: DC | PRN

## 2020-03-27 MED ORDER — PHENYLEPHRINE 40 MCG/ML (10ML) SYRINGE FOR IV PUSH (FOR BLOOD PRESSURE SUPPORT)
PREFILLED_SYRINGE | INTRAVENOUS | Status: DC | PRN
  Administered 2020-03-27: 40 ug via INTRAVENOUS

## 2020-03-27 MED ORDER — HYDROMORPHONE HCL 1 MG/ML IJ SOLN
INTRAMUSCULAR | Status: AC
  Filled 2020-03-27: qty 1

## 2020-03-27 MED ORDER — FENTANYL CITRATE (PF) 100 MCG/2ML IJ SOLN
INTRAMUSCULAR | Status: DC | PRN
  Administered 2020-03-27: 50 ug via INTRAVENOUS

## 2020-03-27 MED ORDER — EPHEDRINE SULFATE-NACL 50-0.9 MG/10ML-% IV SOSY
PREFILLED_SYRINGE | INTRAVENOUS | Status: DC | PRN
  Administered 2020-03-27: 10 mg via INTRAVENOUS

## 2020-03-27 MED ORDER — ONDANSETRON HCL 4 MG/2ML IJ SOLN: 4.0000 mg | Freq: Once | INTRAMUSCULAR | Status: DC | PRN

## 2020-03-27 MED ORDER — HYDROMORPHONE HCL 1 MG/ML IJ SOLN
0.2500 mg | INTRAMUSCULAR | Status: DC | PRN
  Administered 2020-03-27: 0.25 mg via INTRAVENOUS

## 2020-03-27 MED ORDER — ACETAMINOPHEN 500 MG PO TABS
ORAL_TABLET | ORAL | Status: AC
  Filled 2020-03-27: qty 2

## 2020-03-27 MED ORDER — DEXAMETHASONE SODIUM PHOSPHATE 10 MG/ML IJ SOLN
INTRAMUSCULAR | Status: DC | PRN
  Administered 2020-03-27: 10 mg via INTRAVENOUS

## 2020-03-27 MED ORDER — ACETAMINOPHEN 500 MG PO TABS
1000.0000 mg | ORAL_TABLET | Freq: Once | ORAL | Status: AC
  Administered 2020-03-27: 1000 mg via ORAL

## 2020-03-27 MED ORDER — PHENYLEPHRINE 40 MCG/ML (10ML) SYRINGE FOR IV PUSH (FOR BLOOD PRESSURE SUPPORT)
PREFILLED_SYRINGE | INTRAVENOUS | Status: AC
  Filled 2020-03-27: qty 10

## 2020-03-27 MED ORDER — OXYCODONE HCL 5 MG PO TABS: 5.0000 mg | ORAL_TABLET | Freq: Once | ORAL | Status: DC | PRN

## 2020-03-27 MED ORDER — SODIUM CHLORIDE 0.9% FLUSH
10.0000 mL | Freq: Once | INTRAVENOUS | Status: AC
  Administered 2020-03-27: 10 mL via INTRAVENOUS

## 2020-03-27 MED ORDER — POVIDONE-IODINE 10 % EX SWAB: 2.0000 "application " | Freq: Once | CUTANEOUS | Status: DC

## 2020-03-27 MED ORDER — SODIUM CHLORIDE 0.9 % IR SOLN
Status: DC | PRN
  Administered 2020-03-27: 1000 mL

## 2020-03-27 MED ORDER — PROPOFOL 10 MG/ML IV BOLUS
INTRAVENOUS | Status: DC | PRN
  Administered 2020-03-27: 150 mg via INTRAVENOUS

## 2020-03-27 MED ORDER — LIDOCAINE 2% (20 MG/ML) 5 ML SYRINGE
INTRAMUSCULAR | Status: DC | PRN
  Administered 2020-03-27: 100 mg via INTRAVENOUS

## 2020-03-27 MED ORDER — AMISULPRIDE (ANTIEMETIC) 5 MG/2ML IV SOLN: 10.0000 mg | Freq: Once | INTRAVENOUS | Status: DC | PRN

## 2020-03-27 MED ORDER — ONDANSETRON HCL 4 MG/2ML IJ SOLN
INTRAMUSCULAR | Status: DC | PRN
  Administered 2020-03-27: 4 mg via INTRAVENOUS

## 2020-03-27 MED ORDER — EPHEDRINE 5 MG/ML INJ
INTRAVENOUS | Status: AC
  Filled 2020-03-27: qty 10

## 2020-03-27 MED ORDER — KETOROLAC TROMETHAMINE 30 MG/ML IJ SOLN
INTRAMUSCULAR | Status: DC | PRN
  Administered 2020-03-27: 30 mg via INTRAVENOUS

## 2020-03-27 MED ORDER — SODIUM CHLORIDE 0.9 % IR SOLN
Status: DC | PRN
  Administered 2020-03-27: 1000 mL via INTRAVESICAL

## 2020-03-27 MED ORDER — LACTATED RINGERS IV SOLN: INTRAVENOUS | Status: DC

## 2020-03-27 MED ORDER — PROPOFOL 10 MG/ML IV BOLUS
INTRAVENOUS | Status: AC
  Filled 2020-03-27: qty 20

## 2020-03-27 MED ORDER — MIDAZOLAM HCL 5 MG/5ML IJ SOLN
INTRAMUSCULAR | Status: DC | PRN
  Administered 2020-03-27: 2 mg via INTRAVENOUS

## 2020-03-27 MED ORDER — MIDAZOLAM HCL 2 MG/2ML IJ SOLN
INTRAMUSCULAR | Status: AC
  Filled 2020-03-27: qty 2

## 2020-03-27 MED ORDER — KETOROLAC TROMETHAMINE 30 MG/ML IJ SOLN
INTRAMUSCULAR | Status: AC
  Filled 2020-03-27: qty 1

## 2020-03-27 MED ORDER — HEPARIN SOD (PORK) LOCK FLUSH 100 UNIT/ML IV SOLN
500.0000 [IU] | Freq: Once | INTRAVENOUS | Status: AC
  Administered 2020-03-27: 500 [IU] via INTRAVENOUS

## 2020-03-27 SURGICAL SUPPLY — 19 items
BNDG CONFORM 2 STRL LF (GAUZE/BANDAGES/DRESSINGS) IMPLANT
COVER WAND RF STERILE (DRAPES) ×3 IMPLANT
DILATOR CANAL MILEX (MISCELLANEOUS) IMPLANT
DRSG PAD ABDOMINAL 8X10 ST (GAUZE/BANDAGES/DRESSINGS) ×3 IMPLANT
GAUZE 4X4 16PLY RFD (DISPOSABLE) ×3 IMPLANT
GLOVE BIO SURGEON STRL SZ7.5 (GLOVE) ×6 IMPLANT
GOWN STRL REUS W/TWL LRG LVL3 (GOWN DISPOSABLE) ×3 IMPLANT
HOLDER FOLEY CATH W/STRAP (MISCELLANEOUS) ×3 IMPLANT
IV NS 1000ML (IV SOLUTION) ×3
IV NS 1000ML BAXH (IV SOLUTION) ×1 IMPLANT
IV SET EXTENSION GRAVITY 40 LF (IV SETS) ×3 IMPLANT
KIT TURNOVER CYSTO (KITS) ×3 IMPLANT
MAT PREVALON FULL STRYKER (MISCELLANEOUS) ×3 IMPLANT
PACK VAGINAL MINOR WOMEN LF (CUSTOM PROCEDURE TRAY) ×3 IMPLANT
PACKING VAGINAL (PACKING) IMPLANT
PAD OB MATERNITY 4.3X12.25 (PERSONAL CARE ITEMS) IMPLANT
TOWEL OR 17X26 10 PK STRL BLUE (TOWEL DISPOSABLE) ×3 IMPLANT
TRAY FOLEY W/BAG SLVR 14FR LF (SET/KITS/TRAYS/PACK) ×3 IMPLANT
WATER STERILE IRR 500ML POUR (IV SOLUTION) ×3 IMPLANT

## 2020-03-27 NOTE — Anesthesia Postprocedure Evaluation (Signed)
Anesthesia Post Note  Patient: Anne Harris  Procedure(s) Performed: TANDEM RING INSERTION (N/A Vagina ) OPERATIVE ULTRASOUND (N/A Vagina )     Patient location during evaluation: PACU Anesthesia Type: General Level of consciousness: awake and alert Pain management: pain level controlled Vital Signs Assessment: post-procedure vital signs reviewed and stable Respiratory status: spontaneous breathing, nonlabored ventilation, respiratory function stable and patient connected to nasal cannula oxygen Cardiovascular status: blood pressure returned to baseline and stable Postop Assessment: no apparent nausea or vomiting Anesthetic complications: no   No complications documented.  Last Vitals:  Vitals:   03/27/20 0845 03/27/20 0900  BP: (!) 154/78 (!) 147/78  Pulse: 83 83  Resp: 13 16  Temp:    SpO2: 92% 92%    Last Pain:  Vitals:   03/27/20 0840  TempSrc:   PainSc: 5                  Barnet Glasgow

## 2020-03-27 NOTE — Progress Notes (Signed)
910 am Patient transported by stretcher from [place] PACU to [place]. Patient alert and oriented. 940 am Patient brought to [facility] rm 1 for observation by CT staff. Patient denies pain. SCD wraps attached to SCD pump. IV of LR infusing at 125/cc hour to patient's right chest port. IV tubing attached  to Alaris IV pump at the same rate.BP 145/77 P 82 R 18 BP 145/77 O2 sat 97 %. Patient with HOB up 30 degrees watching TV and sipping on a soda. Foley catheter intact and draining clear lt yellow urine into drainage bag. 1015 Patient eating breakfast and declines pain med. 1045 Patient reports her pain is a 4 out of 10 and soul like pain medicine. 1056 0.5 mg Dilaudid IV given per vo Dr. Sondra Come. BP 123/67 P 92 at 11 am.  Patient states her pain is a 0 out of 10. 1135 Patient transported to [place] [facility] by RT staff. SCD pump removed from leg wraps.  1220 Patient back in room 1. 1225 Foley catheter removed with tip intact.\1460 Tandem Ring equipment removed without problems by Dr. Sondra Come. [place], RN deacessed right chest oport from pt's LR. 1300 Patient discharged with d/c instructions via w/c to fist floor for her son to pick her up.

## 2020-03-27 NOTE — Patient Instructions (Signed)
IMMEDIATELY FOLLOWING SURGERY: Do not drive or operate machinery for the first twenty four hours after surgery. Do not make any important decisions for twenty four hours after surgery or while taking narcotic pain medications or sedatives. If you develop intractable nausea and vomiting or a severe headache please notify your doctor immediately.   FOLLOW-UP: You do not need to follow up with anesthesia unless specifically instructed to do so.   WOUND CARE INSTRUCTIONS (if applicable): Expect some mild vaginal bleeding, but if large amount of bleeding occurs please contact Dr. Sondra Come at [phone] or the Radiation On-Call physician. Call for any fever greater than 101.0 degrees or increasing vaginal//abdominal pain or trouble urinating.   QUESTIONS?: Please feel free to call your physician or the hospital operator if you have any questions, and they will be happy to assist you. Resume all medications: as listed on your after visit summary. Your next appointment is:  Future Appointments  Date Time Provider Leonardo  03/29/2020 10:15 AM [facility] None  03/29/2020 10:30 AM [facility] [place] FLUSH [facility] None  03/29/2020 11:00 AM Heath Lark, MD [facility] None  05/28/2020  1:00 PM [facility] T[facility] 1 [facility]

## 2020-03-27 NOTE — Op Note (Signed)
03/27/2020  8:37 AM  PATIENT:  Anne Harris  59 y.o. female  PRE-OPERATIVE DIAGNOSIS:  CERVIX CANCER  POST-OPERATIVE DIAGNOSIS:  CERVIX CANCER  PROCEDURE:  Procedure(s) with comments: TANDEM RING INSERTION (N/A) - ULTRASOUND AND TECH OPERATIVE ULTRASOUND (N/A)  SURGEON:  Surgeon(s) and Role:    * Gery Pray, MD - Primary  PHYSICIAN ASSISTANT:   ASSISTANTS: none   ANESTHESIA:   general  EBL:  2 mL   BLOOD ADMINISTERED:none  DRAINS: Urinary Catheter (Foley)   LOCAL MEDICATIONS USED:  NONE  SPECIMEN:  No Specimen  DISPOSITION OF SPECIMEN:  N/A  COUNTS:  YES  TOURNIQUET:  * No tourniquets in log *  DICTATION: Patient was taken to the outpatient surgical suiteOR, room #4. The patient was prepped and draped in the usual sterile fashion and placed in the dorsolithotomy position. Timeout for the procedure,estimated length of the procedure and preoperative medications was performed. A Foley catheter was placed without difficulty. For imaging purposes approximately 250 cc of normal saline was placed within the bladder.Excellent ultrasound images were obtained. Exam under anesthesia revealed an excellent response to the patient's radiation therapy (external beam and radiosensitizing chemotherapy). The cervical region was estimated to be approximately 2.5 x2.1cm. No obvious parametrial involvement at this time.  The patient then proceeded to undergo sounding of the uterus. The uterus was estimated to be approximately ~ 7 cm in length. This was then followed by dilation of the cervical os without difficulty. A 60 mm cervical sleeve was placed within the endocervical canal and endometrial canal. I then placed a 34mm 45 degree tandem within the cervical sleeve. A 45 degree cervical ring was then attached to the tandem. This had a small shielding cap covering the ringportion. Patient then had a rectal paddle placed posteriorly. Ultrasound imaging at the  completion of the procedure showed good placement of the tandem and ring for high-dose-rate radiation therapy. After recovery from anesthesia the patient will be transferred to radiation oncology for CT simulation with tandem/ ring in place. She will later in thr day undergo herfifthhigh-dose-rate treatment directed to the cervical region. She will receive 5.5 Gy to the high risk clinical target volume. Iridium 192 will be the high-dose-rate source.  PLAN OF CARE: Transferred to radiation oncology for planning and treatment  PATIENT DISPOSITION:  PACU - hemodynamically stable.   Delay start of Pharmacological VTE agent (>24hrs) due to surgical blood loss or risk of bleeding: not applicable

## 2020-03-27 NOTE — Transfer of Care (Signed)
Immediate Anesthesia Transfer of Care Note  Patient: Anne Harris  Procedure(s) Performed: TANDEM RING INSERTION (N/A Vagina ) OPERATIVE ULTRASOUND (N/A Vagina )  Patient Location: PACU  Anesthesia Type:General  Level of Consciousness: awake, alert , oriented and patient cooperative  Airway & Oxygen Therapy: Patient Spontanous Breathing and Patient connected to nasal cannula oxygen  Post-op Assessment: Report given to RN   Post vital signs: Reviewed and stable  Last Vitals:  Vitals Value Taken Time  BP 152/70 03/27/20 0809  Temp    Pulse 91 03/27/20 0812  Resp 14 03/27/20 0812  SpO2 92 % 03/27/20 0812  Vitals shown include unvalidated device data.  Last Pain:  Vitals:   03/27/20 0552  TempSrc: Oral  PainSc: 0-No pain      Patients Stated Pain Goal: 2 (41/03/01 3143)  Complications: No complications documented.

## 2020-03-27 NOTE — Discharge Instructions (Signed)

## 2020-03-27 NOTE — Anesthesia Procedure Notes (Signed)
Procedure Name: LMA Insertion Date/Time: 03/27/2020 7:32 AM Performed by: Rogers Blocker, CRNA Pre-anesthesia Checklist: Patient identified, Emergency Drugs available, Suction available and Patient being monitored Patient Re-evaluated:Patient Re-evaluated prior to induction Oxygen Delivery Method: Circle System Utilized Preoxygenation: Pre-oxygenation with 100% oxygen Induction Type: IV induction Ventilation: Mask ventilation without difficulty LMA: LMA inserted LMA Size: 4.0 Number of attempts: 1 Placement Confirmation: positive ETCO2 Tube secured with: Tape Dental Injury: Teeth and Oropharynx as per pre-operative assessment

## 2020-03-27 NOTE — Progress Notes (Signed)
  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 Identity was confirmed. All relevant records and images related to the planned course of therapy were reviewed. The patient freely provided informed written consent to proceed with treatment after reviewing the details related to the planned course of therapy. The consent form was witnessed and verified by the simulation staff. Then, the patient was set-up in a stable reproducible supine position for radiation therapy. The tandem ring system was accessed and fiducial markers were placed within the tandem and ring.   Simple treatment device note: On the operating room the patient had construction of her custom tandem ring system. She will be treated with a 45 tandem/ring system. The patient had placement of a 60 mm tandem. A cervical ring with a small shielding was used for her treatment. A rectal paddle was also part of her custom set up device.  Verification simulation note: An AP and lateral film was obtained through the pelvis area. This was compared to the patient's planning films documenting accurate position of the tandem/ring system for treatment.  High-dose-rate brachytherapy treatment note:  The remote afterloading device was accessed through catheter system and attached to the tandem ring system. Patient then proceeded to undergo her fifth high-dose-rate treatment directed at the cervix. The patient was prescribed a dose of 5.5 gray to be delivered to the [place]. Patient was treated with 2 channels using 26 dwell positions. Treatment time was 415.8 seconds. The patient tolerated the procedure well. After completion of her therapy, a radiation  survey was performed documenting return of the iridium source into the [employer] safe. The patient was then transferred to the nursing suite. She then had removal of the rectal paddle followed by the tandem and ring system. The patient tolerated the removal well.  PLAN: The patient will return in one month for routine follow-up. ________________________________    Blair Promise, PhD, MD  This document serves as a record of services personally performed by Gery Pray, MD. It was created on his behalf by Clerance Lav, a trained medical scribe. The creation of this record is based on the scribe's personal observations and the provider's statements to them. This document has been checked and approved by the attending provider.

## 2020-03-27 NOTE — Interval H&P Note (Signed)
History and Physical Interval Note:  03/27/2020 7:24 AM  Anne Harris  has presented today for surgery, with the diagnosis of CERVIX CANCER.  The various methods of treatment have been discussed with the patient and family. After consideration of risks, benefits and other options for treatment, the patient has consented to  Procedure(s) with comments: TANDEM RING INSERTION (N/A) - ULTRASOUND AND TECH OPERATIVE ULTRASOUND (N/A) as a surgical intervention.  The patient's history has been reviewed, patient examined, no change in status, stable for surgery.  I have reviewed the patient's chart and labs.  Questions were answered to the patient's satisfaction.     Gery Pray

## 2020-03-28 ENCOUNTER — Encounter (HOSPITAL_BASED_OUTPATIENT_CLINIC_OR_DEPARTMENT_OTHER): Payer: Self-pay | Admitting: Radiation Oncology

## 2020-03-28 NOTE — Addendum Note (Signed)
Encounter addended by: De Burrs, RN on: 03/28/2020 9:22 AM  Actions taken: LDA properties accepted, Clinical Note Signed

## 2020-03-28 NOTE — Progress Notes (Signed)
Patient intake 300 cc IV, PO 600 cc = 900 cc  U/O 500cc

## 2020-03-29 ENCOUNTER — Telehealth: Payer: Self-pay | Admitting: *Deleted

## 2020-03-29 ENCOUNTER — Inpatient Hospital Stay: Payer: BC Managed Care – PPO | Admitting: Hematology and Oncology

## 2020-03-29 ENCOUNTER — Inpatient Hospital Stay: Payer: BC Managed Care – PPO

## 2020-03-29 NOTE — Telephone Encounter (Signed)
Received vm call from pt stating that she needs to r/s appt for today.  Message to schedulers. Dr Alvy Bimler notified.

## 2020-04-05 ENCOUNTER — Telehealth: Payer: Self-pay | Admitting: Hematology and Oncology

## 2020-04-05 ENCOUNTER — Encounter: Payer: Self-pay | Admitting: Hematology and Oncology

## 2020-04-05 ENCOUNTER — Inpatient Hospital Stay (HOSPITAL_BASED_OUTPATIENT_CLINIC_OR_DEPARTMENT_OTHER): Payer: BC Managed Care – PPO | Admitting: Hematology and Oncology

## 2020-04-05 ENCOUNTER — Other Ambulatory Visit: Payer: Self-pay

## 2020-04-05 ENCOUNTER — Inpatient Hospital Stay: Payer: BC Managed Care – PPO

## 2020-04-05 ENCOUNTER — Inpatient Hospital Stay: Payer: BC Managed Care – PPO | Attending: Gynecologic Oncology

## 2020-04-05 VITALS — BP 166/82 | HR 93 | Temp 98.3°F | Resp 18 | Ht 65.0 in | Wt 213.0 lb

## 2020-04-05 DIAGNOSIS — Z23 Encounter for immunization: Secondary | ICD-10-CM

## 2020-04-05 DIAGNOSIS — C539 Malignant neoplasm of cervix uteri, unspecified: Secondary | ICD-10-CM | POA: Insufficient documentation

## 2020-04-05 DIAGNOSIS — D649 Anemia, unspecified: Secondary | ICD-10-CM | POA: Diagnosis not present

## 2020-04-05 DIAGNOSIS — Z299 Encounter for prophylactic measures, unspecified: Secondary | ICD-10-CM | POA: Insufficient documentation

## 2020-04-05 DIAGNOSIS — Z452 Encounter for adjustment and management of vascular access device: Secondary | ICD-10-CM | POA: Insufficient documentation

## 2020-04-05 DIAGNOSIS — D638 Anemia in other chronic diseases classified elsewhere: Secondary | ICD-10-CM

## 2020-04-05 DIAGNOSIS — I10 Essential (primary) hypertension: Secondary | ICD-10-CM | POA: Insufficient documentation

## 2020-04-05 LAB — CBC WITH DIFFERENTIAL (CANCER CENTER ONLY)
Abs Immature Granulocytes: 0.01 10*3/uL (ref 0.00–0.07)
Basophils Absolute: 0 10*3/uL (ref 0.0–0.1)
Basophils Relative: 0 %
Eosinophils Absolute: 0.1 10*3/uL (ref 0.0–0.5)
Eosinophils Relative: 1 %
HCT: 30.5 % — ABNORMAL LOW (ref 36.0–46.0)
Hemoglobin: 9.9 g/dL — ABNORMAL LOW (ref 12.0–15.0)
Immature Granulocytes: 0 %
Lymphocytes Relative: 7 %
Lymphs Abs: 0.5 10*3/uL — ABNORMAL LOW (ref 0.7–4.0)
MCH: 27.3 pg (ref 26.0–34.0)
MCHC: 32.5 g/dL (ref 30.0–36.0)
MCV: 84 fL (ref 80.0–100.0)
Monocytes Absolute: 0.4 10*3/uL (ref 0.1–1.0)
Monocytes Relative: 6 %
Neutro Abs: 5.4 10*3/uL (ref 1.7–7.7)
Neutrophils Relative %: 86 %
Platelet Count: 193 10*3/uL (ref 150–400)
RBC: 3.63 MIL/uL — ABNORMAL LOW (ref 3.87–5.11)
RDW: 16.4 % — ABNORMAL HIGH (ref 11.5–15.5)
WBC Count: 6.3 10*3/uL (ref 4.0–10.5)
nRBC: 0 % (ref 0.0–0.2)

## 2020-04-05 LAB — BASIC METABOLIC PANEL - CANCER CENTER ONLY
Anion gap: 8 (ref 5–15)
BUN: 5 mg/dL — ABNORMAL LOW (ref 6–20)
CO2: 25 mmol/L (ref 22–32)
Calcium: 8.9 mg/dL (ref 8.9–10.3)
Chloride: 108 mmol/L (ref 98–111)
Creatinine: 0.73 mg/dL (ref 0.44–1.00)
GFR, Estimated: >60 mL/min (ref >60)
Glucose, Bld: 96 mg/dL (ref 70–99)
Potassium: 3.3 mmol/L — ABNORMAL LOW (ref 3.5–5.1)
Sodium: 141 mmol/L (ref 135–145)

## 2020-04-05 LAB — MAGNESIUM: Magnesium: 1.8 mg/dL (ref 1.7–2.4)

## 2020-04-05 MED ORDER — SODIUM CHLORIDE 0.9% FLUSH
10.0000 mL | Freq: Once | INTRAVENOUS | Status: AC
  Administered 2020-04-05: 10 mL
  Filled 2020-04-05: qty 10

## 2020-04-05 MED ORDER — INFLUENZA VAC SPLIT QUAD 0.5 ML IM SUSY
PREFILLED_SYRINGE | INTRAMUSCULAR | Status: AC
  Filled 2020-04-05: qty 0.5

## 2020-04-05 MED ORDER — INFLUENZA VAC SPLIT QUAD 0.5 ML IM SUSY
0.5000 mL | PREFILLED_SYRINGE | Freq: Once | INTRAMUSCULAR | Status: AC
  Administered 2020-04-05: 0.5 mL via INTRAMUSCULAR

## 2020-04-05 MED ORDER — HEPARIN SOD (PORK) LOCK FLUSH 100 UNIT/ML IV SOLN
500.0000 [IU] | Freq: Once | INTRAVENOUS | Status: AC
  Administered 2020-04-05: 500 [IU]
  Filled 2020-04-05: qty 5

## 2020-04-05 NOTE — Assessment & Plan Note (Signed)
This is likely due to recent treatment She is not symptomatic Observe for now

## 2020-04-05 NOTE — Assessment & Plan Note (Signed)
She has fully recovered from side effects of treatment I plan to order PET CT scan for objective assessment of response to therapy next month She desired to return back to work and I filled out paperwork to support her desire to return back to work at the end of the month

## 2020-04-05 NOTE — Assessment & Plan Note (Signed)
We discussed the importance of preventive care and reviewed the vaccination programs. She does not have any prior allergic reactions to influenza vaccination. She agrees to proceed with influenza vaccination today and we will administer it today at the clinic.  

## 2020-04-05 NOTE — Telephone Encounter (Signed)
Scheduled appt per 11/4 sch msg - pt is aware of appt date and time

## 2020-04-05 NOTE — Progress Notes (Signed)
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[facility]) She has fully recovered from side effects of treatment I plan to order PET CT scan for objective assessment of response to therapy next month She desired to return back to work and I filled out paperwork to support her desire to return back to work at the end of the month  Anemia due to chronic illness This is likely due to recent treatment She is not symptomatic Observe for now  Hypertension Her blood pressure is mildly elevated She will continue to take her blood pressure medication for now  Preventive measure We discussed the importance of preventive care and reviewed the vaccination programs. She does not have any prior allergic reactions to influenza vaccination. She agrees to proceed with influenza vaccination today and we will administer it today at the clinic.    Orders Placed This Encounter  Procedures  . NM PET Image Restage (PS) Skull Base to Thigh    Standing Status:   Future    Standing Expiration Date:   04/05/2021    Order Specific Question:   If indicated for the ordered procedure, I authorize the administration of a radiopharmaceutical per Radiology protocol    Answer:   Yes    Order Specific Question:   Preferred imaging location?    Answer:   [facility]    Order Specific Question:   Radiology Contrast Protocol - do NOT remove file path    Answer:   [email or web address]    Order Specific Question:   Is the patient pregnant?    Answer:   No    All questions were answered. The patient knows to call the clinic with any problems, questions or concerns. The total time spent in the appointment was 20 minutes encounter with patients including review of chart and various tests  results, discussions about plan of care and coordination of care plan   Heath Lark, MD 04/05/2020 2:00 PM  INTERVAL HISTORY: Please see below for problem oriented charting. She returns for further follow-up She denies pelvic pain no vaginal bleeding No further diarrhea since her last time I saw her She feels great and felt that she has recovered fully from all side effects of therapy  SUMMARY OF ONCOLOGIC HISTORY: Oncology History  Squamous cell carcinoma of cervix ([place])  10/21/2019 Initial Diagnosis   The patient presented to the emergency department on 5/25 and again on 6/3 for abdominal pain and postmenopausal bleeding that started 5/21.  On 6/10 she underwent Pap smear.  Last Pap smear had been 30 years prior and normal per patient.     11/21/2019 Pathology Results   A. CERVIX, BIOPSY:  -  Squamous cell carcinoma    11/29/2019 PET scan   1. Highly hypermetabolic cervical mass, with fluid distended endometrial cavity. Maximum SUV of the cervical mass is 25.8. 2. Left adnexal lesion versus more likely fundal fibroid, maximum SUV 6.0. Surveillance suggested. 3. There is low-grade activity in several abdominal lymph nodes including a borderline enlarged peripancreatic lymph node. However, there is striking activity in numerous enlarged thoracic lymph nodes, some of which have associated calcifications. It would be unusual to have this degree of thoracic adenopathy from cervical cancer without having a commensurate level of abdominopelvic adenopathy. This along with the calcifications of the lymph nodes raises  the possibility of active granulomatous process (such as sarcoidosis) as a potential cause for the striking thoracic adenopathy. Concomitant lymphoma might also have a similar appearance. Sampling of thoracic lymph nodes may be helpful in differentiating metastatic disease other entities such as sarcoid or Lymphoma.  4. Mild pleural/subpleural nodularity in the lungs, merit  surveillance. 5. Cholelithiasis.   11/30/2019 Cancer Staging   Staging form: Cervix Uteri, AJCC Version 9 - Clinical stage from 11/30/2019: FIGO Stage IIB (cT2b, cN0, cM0) - Signed by Heath Lark, MD on 11/30/2019   12/02/2019 Imaging   Ct abdomen and pelvis 1. Cervical soft tissue mass as described in keeping with known malignancy. 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 2. Mildly rounded subcentimeter aortocaval and right pelvic sidewall lymph nodes. 3. A 5.3 x 3.9 cm left fundal exophytic soft tissue suboptimally characterized but possibly a fibroid. 4. Cholelithiasis. 5. No bowel obstruction. Normal appendix. 6. Small bilateral lung base subpleural nodules similar to prior PET CT. Clinical correlation and follow-up recommended.   12/22/2019 Procedure   Successful placement of a right internal jugular approach power injectable Port-A-Cath. The catheter is ready for immediate use.     REVIEW OF SYSTEMS:   Constitutional: Denies fevers, chills or abnormal weight loss Eyes: Denies blurriness of vision Ears, nose, mouth, throat, and face: Denies mucositis or sore throat Respiratory: Denies cough, dyspnea or wheezes Cardiovascular: Denies palpitation, chest discomfort or lower extremity swelling Gastrointestinal:  Denies nausea, heartburn or change in bowel habits Skin: Denies abnormal skin rashes Lymphatics: Denies new lymphadenopathy or easy bruising Neurological:Denies numbness, tingling or new weaknesses Behavioral/Psych: Mood is stable, no new changes  All other systems were reviewed with the patient and are negative.  I have reviewed the past medical history, past surgical history, social history and family history with the patient and they are unchanged from previous note.  ALLERGIES:  is allergic to nsaids.  MEDICATIONS:  Current Outpatient Medications  Medication Sig Dispense Refill  . acetaminophen (TYLENOL) 500 MG tablet Take 1,000 mg  by mouth every 6 (six) hours as needed for moderate pain.    Marland Kitchen amLODipine (NORVASC) 5 MG tablet Take 1 tablet (5 mg total) by mouth daily. 90 tablet 1  . diphenoxylate-atropine (LOMOTIL) 2.5-0.025 MG tablet Take 1 tablet by mouth 4 (four) times daily as needed for diarrhea or loose stools. 60 tablet 0  . HYDROcodone-acetaminophen (NORCO/VICODIN) 5-325 MG tablet Take 1 tablet by mouth every 12 (twelve) hours as needed. 60 tablet 0  . lidocaine-prilocaine (EMLA) cream Apply to affected area once 30 g 3  . loperamide (IMODIUM) 2 MG capsule Take by mouth as needed for diarrhea or loose stools.    . ondansetron (ZOFRAN ODT) 4 MG disintegrating tablet Take 1 tablet (4 mg total) by mouth every 8 (eight) hours as needed for nausea or vomiting. 20 tablet 0  . ondansetron (ZOFRAN) 8 MG tablet Take 1 tablet (8 mg total) by mouth every 8 (eight) hours as needed. Start on the third day after chemotherapy. 30 tablet 1  . prochlorperazine (COMPAZINE) 10 MG tablet Take 1 tablet (10 mg total) by mouth every 6 (six) hours as needed (Nausea or vomiting). 30 tablet 1   No current facility-administered medications for this visit.    PHYSICAL EXAMINATION: ECOG PERFORMANCE STATUS: 1 - Symptomatic but completely ambulatory  Vitals:   04/05/20 1301  BP: (!) 166/82  Pulse: 93  Resp: 18  Temp: 98.3 F (36.8 C)  SpO2: 100%   Filed Weights  04/05/20 1301  Weight: 213 lb (96.6 kg)    GENERAL:alert, no distress and comfortable Musculoskeletal:no cyanosis of digits and no clubbing  NEURO: alert & oriented x 3 with fluent speech, no focal motor/sensory deficits  LABORATORY DATA:  I have reviewed the data as listed    Component Value Date/Time   NA 141 04/05/2020 1245   K 3.3 (L) 04/05/2020 1245   CL 108 04/05/2020 1245   CO2 25 04/05/2020 1245   GLUCOSE 96 04/05/2020 1245   BUN 5 (L) 04/05/2020 1245   CREATININE 0.73 04/05/2020 1245   CALCIUM 8.9 04/05/2020 1245   PROT 7.1 01/01/2020 1113   ALBUMIN  3.2 (L) 01/01/2020 1113   AST 12 (L) 01/01/2020 1113   AST 13 (L) 12/29/2019 0746   ALT 10 01/01/2020 1113   ALT 8 12/29/2019 0746   ALKPHOS 61 01/01/2020 1113   BILITOT 0.6 01/01/2020 1113   BILITOT 0.3 12/29/2019 0746   GFRNONAA >60 04/05/2020 1245   GFRAA >60 02/27/2020 0829   GFRAA >60 01/31/2020 0945    No results found for: SPEP, UPEP  Lab Results  Component Value Date   WBC 6.3 04/05/2020   NEUTROABS 5.4 04/05/2020   HGB 9.9 (L) 04/05/2020   HCT 30.5 (L) 04/05/2020   MCV 84.0 04/05/2020   PLT 193 04/05/2020      Chemistry      Component Value Date/Time   NA 141 04/05/2020 1245   K 3.3 (L) 04/05/2020 1245   CL 108 04/05/2020 1245   CO2 25 04/05/2020 1245   BUN 5 (L) 04/05/2020 1245   CREATININE 0.73 04/05/2020 1245      Component Value Date/Time   CALCIUM 8.9 04/05/2020 1245   ALKPHOS 61 01/01/2020 1113   AST 12 (L) 01/01/2020 1113   AST 13 (L) 12/29/2019 0746   ALT 10 01/01/2020 1113   ALT 8 12/29/2019 0746   BILITOT 0.6 01/01/2020 1113   BILITOT 0.3 12/29/2019 0746

## 2020-04-05 NOTE — Assessment & Plan Note (Signed)
Her blood pressure is mildly elevated She will continue to take her blood pressure medication for now

## 2020-05-21 ENCOUNTER — Ambulatory Visit (HOSPITAL_COMMUNITY)
Admission: RE | Admit: 2020-05-21 | Discharge: 2020-05-21 | Disposition: A | Discharge status: Home / Self Care | Payer: BC Managed Care – PPO | Source: Ambulatory Visit | Attending: Hematology and Oncology | Admitting: Hematology and Oncology

## 2020-05-21 ENCOUNTER — Other Ambulatory Visit: Payer: Self-pay

## 2020-05-21 DIAGNOSIS — C539 Malignant neoplasm of cervix uteri, unspecified: Secondary | ICD-10-CM | POA: Insufficient documentation

## 2020-05-21 LAB — GLUCOSE, CAPILLARY: Glucose-Capillary: 93 mg/dL (ref 70–99)

## 2020-05-21 MED ORDER — FLUDEOXYGLUCOSE F - 18 (FDG) INJECTION
10.6400 | Freq: Once | INTRAVENOUS | Status: AC
  Administered 2020-05-21: 10.64 via INTRAVENOUS

## 2020-05-22 ENCOUNTER — Inpatient Hospital Stay: Payer: BC Managed Care – PPO | Attending: Gynecologic Oncology | Admitting: Hematology and Oncology

## 2020-05-22 ENCOUNTER — Encounter: Payer: Self-pay | Admitting: Hematology and Oncology

## 2020-05-22 ENCOUNTER — Other Ambulatory Visit: Payer: Self-pay

## 2020-05-22 VITALS — BP 156/89 | HR 94 | Temp 97.5°F | Resp 17 | Ht 65.0 in | Wt 216.8 lb

## 2020-05-22 DIAGNOSIS — C539 Malignant neoplasm of cervix uteri, unspecified: Secondary | ICD-10-CM | POA: Insufficient documentation

## 2020-05-22 DIAGNOSIS — R590 Localized enlarged lymph nodes: Secondary | ICD-10-CM | POA: Insufficient documentation

## 2020-05-22 DIAGNOSIS — I10 Essential (primary) hypertension: Secondary | ICD-10-CM | POA: Insufficient documentation

## 2020-05-22 DIAGNOSIS — R918 Other nonspecific abnormal finding of lung field: Secondary | ICD-10-CM | POA: Insufficient documentation

## 2020-05-22 DIAGNOSIS — Z79899 Other long term (current) drug therapy: Secondary | ICD-10-CM | POA: Diagnosis not present

## 2020-05-22 NOTE — Assessment & Plan Note (Signed)
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

## 2020-05-22 NOTE — Progress Notes (Signed)
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 IR REMOVAL TUN ACCESS W/ PORT W/O FL MOD SED    Standing Status:   Future    Standing Expiration Date:   05/22/2021    Order Specific Question:   Reason for exam:    Answer:   chemo is completed    Order Specific Question:   Preferred Imaging Location?    Answer:   [facility]    All questions were answered. The patient knows to call the clinic with any problems, questions or concerns. The total time spent in the appointment was 25 minutes encounter with patients including review of chart and various tests results, discussions about plan of care and coordination of care plan   Heath Lark, MD 05/22/2020 11:35 AM  INTERVAL HISTORY: Please see below for problem oriented charting. She returns for further follow-up and review of imaging studies She feels well No pelvic pain She has no residual side effects from chemotherapy or radiation treatment Her blood pressure is elevated but she denies symptoms  SUMMARY OF ONCOLOGIC HISTORY: Oncology History  Squamous cell carcinoma of cervix ([place])  10/21/2019 Initial Diagnosis   The patient presented to the emergency department on 5/25 and again on 6/3 for abdominal pain and postmenopausal bleeding  that started 5/21.  On 6/10 she underwent Pap smear.  Last Pap smear had been 30 years prior and normal per patient.     11/21/2019 Pathology Results   A. CERVIX, BIOPSY:  -  Squamous cell carcinoma    11/29/2019 PET scan   1. Highly hypermetabolic cervical mass, with fluid distended endometrial cavity. Maximum SUV of the cervical mass is 25.8. 2. Left adnexal lesion versus more likely fundal fibroid, maximum SUV 6.0. Surveillance suggested. 3. There is low-grade activity in several abdominal lymph nodes including a borderline enlarged peripancreatic lymph node. However, there is striking activity in numerous enlarged thoracic lymph nodes, some of which have associated calcifications. It would be unusual to have this degree of thoracic adenopathy from cervical cancer without having a commensurate level of abdominopelvic adenopathy. This along with the calcifications of the lymph nodes raises the possibility of active granulomatous process (such as sarcoidosis) as a potential cause for the striking thoracic adenopathy. Concomitant lymphoma might also have a similar appearance. Sampling of thoracic lymph nodes may be helpful in differentiating metastatic disease other entities such as sarcoid or Lymphoma.  4. Mild pleural/subpleural nodularity in the lungs, merit surveillance. 5. Cholelithiasis.   11/30/2019 Cancer Staging   Staging form: Cervix Uteri, AJCC Version 9 - Clinical stage from 11/30/2019: FIGO Stage IIB (cT2b, cN0, cM0) - Signed by Heath Lark, MD on 11/30/2019   12/02/2019 Imaging   Ct abdomen and pelvis 1. Cervical soft tissue mass as described in keeping with known malignancy. 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 2.  Mildly rounded subcentimeter aortocaval and right pelvic sidewall lymph nodes. 3. A 5.3 x 3.9 cm left fundal exophytic soft tissue suboptimally characterized but possibly a fibroid. 4. Cholelithiasis. 5. No bowel obstruction. Normal  appendix. 6. Small bilateral lung base subpleural nodules similar to prior PET CT. Clinical correlation and follow-up recommended.   12/22/2019 Procedure   Successful placement of a right internal jugular approach power injectable Port-A-Cath. The catheter is ready for immediate use.   12/29/2019 - 01/27/2020 Chemotherapy   The patient had cisplatin for chemotherapy treatment.     05/22/2020 PET scan   1. Compared with the previous PET-CT of 6 months ago, the large hypermetabolic cervical mass has resolved consistent with response to therapy. 2. The previously demonstrated partially calcified mediastinal and hilar adenopathy has mildly improved. 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 3. Stable incidental findings including a probable exophytic fundal fibroid, cholelithiasis and periodontal disease.     REVIEW OF SYSTEMS:   Constitutional: Denies fevers, chills or abnormal weight loss Eyes: Denies blurriness of vision Ears, nose, mouth, throat, and face: Denies mucositis or sore throat Respiratory: Denies cough, dyspnea or wheezes Cardiovascular: Denies palpitation, chest discomfort or lower extremity swelling Gastrointestinal:  Denies nausea, heartburn or change in bowel habits Skin: Denies abnormal skin rashes Lymphatics: Denies new lymphadenopathy or easy bruising Neurological:Denies numbness, tingling or new weaknesses Behavioral/Psych: Mood is stable, no new changes  All other systems were reviewed with the patient and are negative.  I have reviewed the past medical history, past surgical history, social history and family history with the patient and they are unchanged from previous note.  ALLERGIES:  is allergic to nsaids.  MEDICATIONS:  Current Outpatient Medications  Medication Sig Dispense Refill  . acetaminophen  (TYLENOL) 500 MG tablet Take 1,000 mg by mouth every 6 (six) hours as needed for moderate pain.    Marland Kitchen amLODipine (NORVASC) 5 MG tablet Take 1 tablet (5 mg total) by mouth daily. 90 tablet 1  . diphenoxylate-atropine (LOMOTIL) 2.5-0.025 MG tablet Take 1 tablet by mouth 4 (four) times daily as needed for diarrhea or loose stools. 60 tablet 0  . HYDROcodone-acetaminophen (NORCO/VICODIN) 5-325 MG tablet Take 1 tablet by mouth every 12 (twelve) hours as needed. 60 tablet 0  . loperamide (IMODIUM) 2 MG capsule Take by mouth as needed for diarrhea or loose stools.    . ondansetron (ZOFRAN ODT) 4 MG disintegrating tablet Take 1 tablet (4 mg total) by mouth every 8 (eight) hours as needed for nausea or vomiting. 20 tablet 0   No current facility-administered medications for this visit.    PHYSICAL EXAMINATION: ECOG PERFORMANCE STATUS: 1 - Symptomatic but completely ambulatory  Vitals:   05/22/20 1008  BP: (!) 156/89  Pulse: 94  Resp: 17  Temp: (!) 97.5 F (36.4 C)  SpO2: 100%   Filed Weights   05/22/20 1008  Weight: 216 lb 12.8 oz (98.3 kg)    GENERAL:alert, no distress and comfortable NEURO: alert & oriented x 3 with fluent speech, no focal motor/sensory deficits  LABORATORY DATA:  I have reviewed the data as listed    Component Value Date/Time   NA 141 04/05/2020 1245   K 3.3 (L) 04/05/2020 1245   CL 108 04/05/2020 1245   CO2 25 04/05/2020 1245   GLUCOSE 96 04/05/2020 1245   BUN 5 (L) 04/05/2020 1245   CREATININE 0.73 04/05/2020  1245   CALCIUM 8.9 04/05/2020 1245   PROT 7.1 01/01/2020 1113   ALBUMIN 3.2 (L) 01/01/2020 1113   AST 12 (L) 01/01/2020 1113   AST 13 (L) 12/29/2019 0746   ALT 10 01/01/2020 1113   ALT 8 12/29/2019 0746   ALKPHOS 61 01/01/2020 1113   BILITOT 0.6 01/01/2020 1113   BILITOT 0.3 12/29/2019 0746   GFRNONAA >60 04/05/2020 1245   GFRAA >60 02/27/2020 0829   GFRAA >60 01/31/2020 0945    No results found for: SPEP, UPEP  Lab Results  Component Value  Date   WBC 6.3 04/05/2020   NEUTROABS 5.4 04/05/2020   HGB 9.9 (L) 04/05/2020   HCT 30.5 (L) 04/05/2020   MCV 84.0 04/05/2020   PLT 193 04/05/2020      Chemistry      Component Value Date/Time   NA 141 04/05/2020 1245   K 3.3 (L) 04/05/2020 1245   CL 108 04/05/2020 1245   CO2 25 04/05/2020 1245   BUN 5 (L) 04/05/2020 1245   CREATININE 0.73 04/05/2020 1245      Component Value Date/Time   CALCIUM 8.9 04/05/2020 1245   ALKPHOS 61 01/01/2020 1113   AST 12 (L) 01/01/2020 1113   AST 13 (L) 12/29/2019 0746   ALT 10 01/01/2020 1113   ALT 8 12/29/2019 0746   BILITOT 0.6 01/01/2020 1113   BILITOT 0.3 12/29/2019 0746       RADIOGRAPHIC STUDIES: I have reviewed multiple imaging studies with the patient I have personally reviewed the radiological images as listed and agreed with the findings in the report. NM PET Image Restage (PS) Skull Base to Thigh  Result Date: 05/21/2020 CLINICAL DATA:  Subsequent treatment strategy for cervical cancer. EXAM: NUCLEAR MEDICINE PET SKULL BASE TO THIGH TECHNIQUE: 10.64 mCi F-18 FDG was injected intravenously. Full-ring PET imaging was performed from the skull base to thigh after the radiotracer. CT data was obtained and used for attenuation correction and anatomic localization. Fasting blood glucose: 93 mg/dl COMPARISON:  PET-CT 11/29/2019. Abdominopelvic CT 01/01/2020. Cardiac CT 05/01/2008. FINDINGS: Mediastinal blood pool activity: SUV max 2.1 NECK: No hypermetabolic cervical lymph nodes are identified.There are no lesions of the pharyngeal mucosal space. Hypermetabolic activity again demonstrated associated with the mandibular and maxillary teeth consistent with periodontal disease. Incidental CT findings: none CHEST: Again demonstrated are multiple hypermetabolic mediastinal and hilar lymph nodes bilaterally. These lymph nodes are partially calcified on CT, measuring up to 2.2 cm in the precarinal station (image 58/4). This node is hypermetabolic with  an SUV max of 11.2 (previously 15.6). The overall extent this hypermetabolic adenopathy has improved. Small hypermetabolic right internal mammary node has slightly improved (SUV max 5.1, previously 7.8). No axillary adenopathy. Grossly stable scattered subpleural nodularity in both lungs without significant hypermetabolic activity. Largest nodules include a 6 mm nodule associated with the right minor fissure on image 38/8 and a 5 mm left lower lobe nodule on image 40/8. Incidental CT findings: Right IJ Port-A-Cath extends to the upper right atrium. ABDOMEN/PELVIS: The previously demonstrated large hypermetabolic cervical mass has resolved. There are no residual hypermetabolic pelvic lymph nodes. Small mildly hypermetabolic portacaval nodes are likely reactive (SUV max 5.2). There is no hypermetabolic activity within the liver, spleen or adrenal glands. Incidental CT findings: Probable exophytic left fundal fibroid measuring 5.0 cm on image 149/4 is grossly stable. This demonstrates low-level peripheral metabolic activity. No significant residual distension of the endometrial cavity. Calcified gallstones are again noted. SKELETON: Mildly heterogeneous marrow activity has  improved. No focal hypermetabolic activity to suggest metastatic disease. Incidental CT findings: none IMPRESSION: 1. Compared with the previous PET-CT of 6 months ago, the large hypermetabolic cervical mass has resolved consistent with response to therapy. 2. The previously demonstrated partially calcified mediastinal and hilar adenopathy has mildly improved. 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 3. Stable incidental findings including a probable exophytic fundal fibroid, cholelithiasis and periodontal disease. Electronically Signed   By: Richardean Sale M.D.   On: 05/21/2020 15:58

## 2020-05-22 NOTE — Assessment & Plan Note (Signed)
I have reviewed imaging study with the patient She has complete response to therapy I will get her port removed I will send a message to her GYN surgeon for future follow-up

## 2020-05-22 NOTE — Assessment & Plan Note (Signed)
She continues to have intermittent elevated blood pressure I recommend close follow-up with primary care doctor for medical management

## 2020-05-24 ENCOUNTER — Telehealth: Payer: Self-pay | Admitting: Oncology

## 2020-05-24 NOTE — Telephone Encounter (Signed)
Called Anne Harris and advised her that Dr. Berline Lopes would like to see her before her port is removed.  Scheduled follow up with Dr. Berline Lopes on 06/08/20 at 2:15 and also advised her of new port removal appointment on 06/14/20 with arrival at 1 pm.  She verbalized understanding and agreement.

## 2020-06-07 ENCOUNTER — Encounter: Payer: Self-pay | Admitting: Gynecologic Oncology

## 2020-06-07 ENCOUNTER — Ambulatory Visit (HOSPITAL_COMMUNITY): Payer: BC Managed Care – PPO

## 2020-06-07 ENCOUNTER — Other Ambulatory Visit (HOSPITAL_COMMUNITY): Payer: BC Managed Care – PPO

## 2020-06-08 ENCOUNTER — Other Ambulatory Visit: Payer: Self-pay

## 2020-06-08 ENCOUNTER — Inpatient Hospital Stay: Payer: Medicaid Other | Attending: Gynecologic Oncology | Admitting: Gynecologic Oncology

## 2020-06-08 ENCOUNTER — Other Ambulatory Visit: Payer: BC Managed Care – PPO

## 2020-06-08 ENCOUNTER — Encounter: Payer: Self-pay | Admitting: Gynecologic Oncology

## 2020-06-08 ENCOUNTER — Encounter: Payer: Self-pay | Admitting: Oncology

## 2020-06-08 VITALS — BP 155/90 | HR 86 | Temp 97.2°F | Resp 18 | Ht 65.0 in | Wt 218.0 lb

## 2020-06-08 DIAGNOSIS — I10 Essential (primary) hypertension: Secondary | ICD-10-CM | POA: Diagnosis not present

## 2020-06-08 DIAGNOSIS — Z20822 Contact with and (suspected) exposure to covid-19: Secondary | ICD-10-CM

## 2020-06-08 DIAGNOSIS — Z923 Personal history of irradiation: Secondary | ICD-10-CM | POA: Insufficient documentation

## 2020-06-08 DIAGNOSIS — C539 Malignant neoplasm of cervix uteri, unspecified: Secondary | ICD-10-CM | POA: Diagnosis not present

## 2020-06-08 DIAGNOSIS — Z9221 Personal history of antineoplastic chemotherapy: Secondary | ICD-10-CM | POA: Insufficient documentation

## 2020-06-08 DIAGNOSIS — Z79899 Other long term (current) drug therapy: Secondary | ICD-10-CM | POA: Insufficient documentation

## 2020-06-08 NOTE — Patient Instructions (Signed)
I do not see any evidence of cancer on your exam today!  You will have visits with [place] every 3 months, alternating between seeing me and seeing Dr. Sondra Come.  I will see you back again over the summer.  If you develop any symptoms between visits, as we discussed today such as vaginal bleeding or pelvic pain, please call to be seen sooner.

## 2020-06-08 NOTE — Progress Notes (Signed)
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  On 6/10 she underwent Pap smear.  Last Pap smear had been 30 years prior and normal per patient.     11/21/2019 Pathology Results   A. CERVIX, BIOPSY:  -  Squamous cell carcinoma    11/29/2019 PET scan   1. Highly hypermetabolic cervical mass, with fluid distended endometrial cavity. Maximum SUV of the cervical mass is 25.8. 2. Left adnexal lesion versus more likely fundal fibroid, maximum SUV 6.0. Surveillance suggested. 3. There is low-grade activity in several abdominal lymph nodes including a borderline enlarged peripancreatic lymph node. However, there is striking activity in numerous enlarged thoracic lymph nodes, some of which have associated calcifications. It would be unusual to have this degree of thoracic adenopathy from cervical cancer without having a commensurate level of abdominopelvic adenopathy. This along with the calcifications of the lymph nodes raises the possibility of active granulomatous process (such as sarcoidosis) as a potential cause for the striking thoracic adenopathy. Concomitant lymphoma might also have a similar appearance. Sampling of thoracic lymph nodes may be helpful in differentiating metastatic disease other entities such as sarcoid or Lymphoma.  4. Mild pleural/subpleural nodularity in the lungs, merit surveillance. 5. Cholelithiasis.   11/30/2019 Cancer Staging   Staging form: Cervix Uteri, AJCC Version 9 - Clinical stage from 11/30/2019: FIGO Stage IIB (cT2b, cN0, cM0) - Signed by Heath Lark, MD on 11/30/2019   12/02/2019 Imaging   Ct abdomen and pelvis 1.  Cervical soft tissue mass as described in keeping with known malignancy. 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 2. Mildly rounded subcentimeter aortocaval and right pelvic sidewall lymph nodes. 3. A 5.3 x 3.9 cm left fundal exophytic soft tissue suboptimally characterized but possibly a fibroid. 4. Cholelithiasis. 5. No bowel obstruction. Normal appendix. 6. Small bilateral lung base subpleural nodules similar to prior PET CT. Clinical correlation and follow-up recommended.   12/22/2019 Procedure   Successful placement of a right internal jugular approach power injectable Port-A-Cath. The catheter is ready for immediate use.   12/29/2019 - 01/27/2020 Chemotherapy   The patient had cisplatin for chemotherapy treatment.     05/22/2020 PET scan   1. Compared with the previous PET-CT of 6 months ago, the large hypermetabolic cervical mass has resolved consistent with response to therapy. 2. The previously demonstrated partially calcified mediastinal and hilar adenopathy has mildly improved. 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 3. Stable incidental findings including a probable exophytic fundal fibroid, cholelithiasis and periodontal disease.    Radiation Therapy   Pelvic radiation and HDR brachytherapy     Interval History: Patient presents for follow-up today after completing definitive radiation with sensitizing cisplatin for treatment of locally advanced cervix cancer. She completed brachytherapy at the end of October. She had some side effects with treatment including diarrhea, fatigue, intermittent decreased appetite, and some pelvic pain. Since completing treatment, she reports almost complete resolution of her symptoms. She now endorses normal bowel and bladder function. She denies any vaginal  bleeding or discharge. She reports having a good appetite without any nausea  or emesis. She lost just over 30 pounds during treatment and her weight is slowly increasing now. She is scheduled to have her Port-A-Cath out next Thursday.  She is scheduled to see her PCP soon about increasing her antihypertensive medication.  Past Medical/Surgical History: Past Medical History:  Diagnosis Date  . Abnormal Pap smear of cervix   . Anemia   . Cervical cancer [facility]) oncologist-- dr Alvy Bimler and dr Sondra Come   dx 05/ 2021, SCC cervical cancer, Stage 2B,  completed chemo 01-27-2020  and completed extenal beam radiation 02-09-2020  . Hypertension    followed by pcp  . Mediastinal adenopathy   . Obesity, Class III, BMI 40-49.9 (morbid obesity) ([place])   . Pancytopenia, acquired ([place])   . Pulmonary nodules    bilateral  . Wears glasses     Past Surgical History:  Procedure Laterality Date  . BRONCHIAL BIOPSY  12/15/2019   Procedure: BRONCHIAL BIOPSIES;  Surgeon: Garner Nash, DO;  Location: [facility];  Service: Pulmonary;;  . BRONCHIAL NEEDLE ASPIRATION BIOPSY  12/15/2019   Procedure: BRONCHIAL NEEDLE ASPIRATION BIOPSIES;  Surgeon: Garner Nash, DO;  Location: [facility];  Service: Pulmonary;;  . BRONCHIAL WASHINGS  12/15/2019   Procedure: BRONCHIAL WASHINGS;  Surgeon: Garner Nash, DO;  Location: [place];  Service: Pulmonary;;  . CERVICAL BIOPSY    . ENDOBRONCHIAL ULTRASOUND N/A 12/15/2019   Procedure: ENDOBRONCHIAL ULTRASOUND;  Surgeon: Garner Nash, DO;  Location: [place];  Service: Pulmonary;  Laterality: N/A;  . IR IMAGING GUIDED PORT INSERTION  12/22/2019  . OPERATIVE ULTRASOUND N/A 02/27/2020   Procedure: OPERATIVE ULTRASOUND;  Surgeon: Gery Pray, MD;  Location: [facility];  Service: Urology;  Laterality: N/A;  . OPERATIVE ULTRASOUND N/A 03/05/2020   Procedure: OPERATIVE ULTRASOUND;  Surgeon: Gery Pray, MD;  Location: [facility];   Service: Urology;  Laterality: N/A;  . OPERATIVE ULTRASOUND N/A 03/14/2020   Procedure: OPERATIVE ULTRASOUND;  Surgeon: Gery Pray, MD;  Location: [facility];  Service: Urology;  Laterality: N/A;  . OPERATIVE ULTRASOUND N/A 03/23/2020   Procedure: OPERATIVE ULTRASOUND;  Surgeon: Gery Pray, MD;  Location: [facility];  Service: Urology;  Laterality: N/A;  . OPERATIVE ULTRASOUND N/A 03/27/2020   Procedure: OPERATIVE ULTRASOUND;  Surgeon: Gery Pray, MD;  Location: [facility];  Service: Urology;  Laterality: N/A;  . TANDEM RING INSERTION N/A 02/27/2020   Procedure: TANDEM RING INSERTION;  Surgeon: Gery Pray, MD;  Location: [facility];  Service: Urology;  Laterality: N/A;  . TANDEM RING INSERTION N/A 03/05/2020   Procedure: TANDEM RING INSERTION;  Surgeon: Gery Pray, MD;  Location: [facility];  Service: Urology;  Laterality: N/A;  . TANDEM RING INSERTION N/A 03/14/2020   Procedure: TANDEM RING INSERTION FOR HIGH DOSE RADIATION THERAPY;  Surgeon: Gery Pray, MD;  Location: [facility];  Service: Urology;  Laterality: N/A;  . TANDEM RING INSERTION N/A 03/23/2020   Procedure: TANDEM RING INSERTION;  Surgeon: Gery Pray, MD;  Location: [facility];  Service: Urology;  Laterality: N/A;  . TANDEM RING INSERTION N/A 03/27/2020   Procedure: TANDEM RING INSERTION;  Surgeon: Gery Pray, MD;  Location: [facility];  Service: Urology;  Laterality: N/A;  ULTRASOUND AND TECH    Family History  Problem Relation Age of Onset  . Colon cancer Mother 36  . Ovarian cancer Neg Hx   . Uterine cancer Neg Hx   .  Breast cancer Neg Hx   . Cervical cancer Neg Hx     Social History   Socioeconomic History  . Marital status: Single    Spouse name: Not on file  . Number of children: 6  . Years of education: Not on file  . Highest education level: High school graduate   Occupational History  . Occupation: care giver  Tobacco Use  . Smoking status: Never Smoker  . Smokeless tobacco: Never Used  Vaping Use  . Vaping Use: Never used  Substance and Sexual Activity  . Alcohol use: Yes    Comment: social  . Drug use: Never  . Sexual activity: Not Currently  Other Topics Concern  . Not on file  Social History Narrative  . Not on file   Social Determinants of Health   Financial Resource Strain: Not on file  Food Insecurity: Not on file  Transportation Needs: No Transportation Needs  . Lack of Transportation (Medical): No  . Lack of Transportation (Non-Medical): No  Physical Activity: Not on file  Stress: Not on file  Social Connections: Not on file    Current Medications:  Current Outpatient Medications:  .  amLODipine (NORVASC) 5 MG tablet, Take 1 tablet (5 mg total) by mouth daily., Disp: 90 tablet, Rfl: 1 .  acetaminophen (TYLENOL) 500 MG tablet, Take 1,000 mg by mouth every 6 (six) hours as needed for moderate pain. (Patient not taking: Reported on 06/07/2020), Disp: , Rfl:  .  diphenoxylate-atropine (LOMOTIL) 2.5-0.025 MG tablet, Take 1 tablet by mouth 4 (four) times daily as needed for diarrhea or loose stools. (Patient not taking: Reported on 06/07/2020), Disp: 60 tablet, Rfl: 0 .  HYDROcodone-acetaminophen (NORCO/VICODIN) 5-325 MG tablet, Take 1 tablet by mouth every 12 (twelve) hours as needed. (Patient not taking: Reported on 06/07/2020), Disp: 60 tablet, Rfl: 0 .  loperamide (IMODIUM) 2 MG capsule, Take by mouth as needed for diarrhea or loose stools. (Patient not taking: Reported on 06/07/2020), Disp: , Rfl:  .  ondansetron (ZOFRAN ODT) 4 MG disintegrating tablet, Take 1 tablet (4 mg total) by mouth every 8 (eight) hours as needed for nausea or vomiting. (Patient not taking: Reported on 06/07/2020), Disp: 20 tablet, Rfl: 0  Review of Systems: Denies appetite changes, fevers, chills, fatigue, unexplained weight changes. Denies hearing loss, neck  lumps or masses, mouth sores, ringing in ears or voice changes. Denies cough or wheezing.  Denies shortness of breath. Denies chest pain or palpitations. Denies leg swelling. Denies abdominal distention, pain, blood in stools, constipation, diarrhea, nausea, vomiting, or early satiety. Denies pain with intercourse, dysuria, frequency, hematuria or incontinence. Denies hot flashes, pelvic pain, vaginal bleeding or vaginal discharge.   Denies joint pain, back pain or muscle pain/cramps. Denies itching, rash, or wounds. Denies dizziness, headaches, numbness or seizures. Denies swollen lymph nodes or glands, denies easy bruising or bleeding. Denies anxiety, depression, confusion, or decreased concentration.  Physical Exam: BP (!) 155/90 (BP Location: Left Arm, Patient Position: Sitting)   Pulse 86   Temp (!) 97.2 F (36.2 C) (Tympanic)   Resp 18   Ht 5\' 5"  (1.651 m)   Wt 218 lb (98.9 kg)   SpO2 100% Comment: RA  BMI 36.28 kg/m  General: Alert, oriented, no acute distress. HEENT: Normocephalic, atraumatic, sclera anicteric. Chest: Unlabored breathing on room air. Extremities: Grossly normal range of motion.  Warm, well perfused.  No edema bilaterally. Skin: No rashes or lesions noted. Lymphatics: No cervical, supraclavicular, or inguinal adenopathy. GU: Normal appearing  external genitalia without erythema, excoriation, or lesions.  Speculum exam reveals moderately atrophic vaginal mucosa, radiation changes apparent.  Cervix flush with the vaginal apex, no visible evidence of malignancy.  On bimanual exam, no nodularity or masses appreciated.  Rectovaginal exam confirms these findings.  Laboratory & Radiologic Studies: PET scan on 05/21/2020 IMPRESSION: 1. Compared with the previous PET-CT of 6 months ago, the large hypermetabolic cervical mass has resolved consistent with response to therapy. 2. The previously demonstrated partially calcified mediastinal and hilar adenopathy has  mildly improved. 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 3. Stable incidental findings including a probable exophytic fundal fibroid, cholelithiasis and periodontal disease.  Assessment & Plan: Anne Harris is a 60 y.o. woman with Stage IIB SCC of the cervix who completed definitive chemoradiation therapy at the end of October who presents for her first surveillance visit after finishing treatment.  Patient has done quite well during treatment and the limited side effects that she have seem to have resolved. She is NED on exam today. We reviewed her posttreatment PET scan that shows excellent response to treatment.  Patient has yet to see her radiation oncologist and so has not been using a vaginal dilator. We discussed the reason for vaginal dilator use as well as frequency. Elmo Putt was able to obtain a vaginal dilator for the patient and reviewed instructions again with her at the end of her visit.  We reviewed NCCN and SGO surveillance recommendations which include a visit for an exam every 3 months for the first couple of years. We will plan to perform a Pap smear yearly. Patient understands that if I see or feel anything on exam, that this may warrant a biopsy to rule out recurrence. We will alternate visits between my office and Dr. Sondra Come. I reviewed signs and symptoms today that would be concerning for disease recurrence and stressed the importance that the patient call me if she develops any of these between visits.  32 minutes of total time was spent for this patient encounter, including preparation, face-to-face counseling with the patient and coordination of care, and documentation of the encounter.  Jeral Pinch, MD  Division of Gynecologic Oncology  Department of Obstetrics and Gynecology  [facility]

## 2020-06-08 NOTE — Progress Notes (Signed)
Gave Anne Harris a size S+, S and M+ vaginal dilators.  Educated her on how to use them and also gave her the vaginal dilator handout.  She verbalized understanding in teach back mode.

## 2020-06-08 NOTE — Progress Notes (Signed)

## 2020-06-11 LAB — NOVEL CORONAVIRUS, NAA: SARS-CoV-2, NAA: NOT DETECTED

## 2020-06-12 ENCOUNTER — Other Ambulatory Visit: Payer: Self-pay | Admitting: Radiology

## 2020-06-14 ENCOUNTER — Ambulatory Visit (HOSPITAL_COMMUNITY)
Admission: RE | Admit: 2020-06-14 | Discharge: 2020-06-14 | Disposition: A | Discharge status: Home / Self Care | Payer: Medicaid Other | Source: Ambulatory Visit | Attending: Hematology and Oncology | Admitting: Hematology and Oncology

## 2020-06-14 ENCOUNTER — Encounter (HOSPITAL_COMMUNITY): Payer: Self-pay

## 2020-06-14 ENCOUNTER — Other Ambulatory Visit: Payer: Self-pay

## 2020-06-14 DIAGNOSIS — Z9221 Personal history of antineoplastic chemotherapy: Secondary | ICD-10-CM | POA: Diagnosis not present

## 2020-06-14 DIAGNOSIS — C539 Malignant neoplasm of cervix uteri, unspecified: Secondary | ICD-10-CM

## 2020-06-14 DIAGNOSIS — Z452 Encounter for adjustment and management of vascular access device: Secondary | ICD-10-CM | POA: Insufficient documentation

## 2020-06-14 HISTORY — PX: IR REMOVAL TUN ACCESS W/ PORT W/O FL MOD SED: IMG2290

## 2020-06-14 LAB — CBC WITH DIFFERENTIAL/PLATELET
Abs Immature Granulocytes: 0.01 10*3/uL (ref 0.00–0.07)
Basophils Absolute: 0 10*3/uL (ref 0.0–0.1)
Basophils Relative: 0 %
Eosinophils Absolute: 0.1 10*3/uL (ref 0.0–0.5)
Eosinophils Relative: 2 %
HCT: 34.1 % — ABNORMAL LOW (ref 36.0–46.0)
Hemoglobin: 10.6 g/dL — ABNORMAL LOW (ref 12.0–15.0)
Immature Granulocytes: 0 %
Lymphocytes Relative: 9 %
Lymphs Abs: 0.5 10*3/uL — ABNORMAL LOW (ref 0.7–4.0)
MCH: 26.7 pg (ref 26.0–34.0)
MCHC: 31.1 g/dL (ref 30.0–36.0)
MCV: 85.9 fL (ref 80.0–100.0)
Monocytes Absolute: 0.3 10*3/uL (ref 0.1–1.0)
Monocytes Relative: 6 %
Neutro Abs: 4.5 10*3/uL (ref 1.7–7.7)
Neutrophils Relative %: 83 %
Platelets: 225 10*3/uL (ref 150–400)
RBC: 3.97 MIL/uL (ref 3.87–5.11)
RDW: 12.6 % (ref 11.5–15.5)
WBC: 5.4 10*3/uL (ref 4.0–10.5)
nRBC: 0 % (ref 0.0–0.2)

## 2020-06-14 LAB — BASIC METABOLIC PANEL
Anion gap: 12 (ref 5–15)
BUN: 10 mg/dL (ref 8–23)
CO2: 25 mmol/L (ref 22–32)
Calcium: 9.3 mg/dL (ref 8.9–10.3)
Chloride: 104 mmol/L (ref 98–111)
Creatinine, Ser: 0.87 mg/dL (ref 0.44–1.00)
GFR, Estimated: >60 mL/min (ref >60)
Glucose, Bld: 90 mg/dL (ref 70–99)
Potassium: 3.7 mmol/L (ref 3.5–5.1)
Sodium: 141 mmol/L (ref 135–145)

## 2020-06-14 LAB — PROTIME-INR
INR: 1 (ref 0.8–1.2)
Prothrombin Time: 13 seconds (ref 11.4–15.2)

## 2020-06-14 MED ORDER — SODIUM CHLORIDE 0.9 % IV SOLN: INTRAVENOUS | Status: DC

## 2020-06-14 MED ORDER — FENTANYL CITRATE (PF) 100 MCG/2ML IJ SOLN
INTRAMUSCULAR | Status: AC
  Filled 2020-06-14: qty 2

## 2020-06-14 MED ORDER — FENTANYL CITRATE (PF) 100 MCG/2ML IJ SOLN
INTRAMUSCULAR | Status: AC | PRN
  Administered 2020-06-14 (×2): 50 ug via INTRAVENOUS

## 2020-06-14 MED ORDER — CEFAZOLIN SODIUM-DEXTROSE 2-4 GM/100ML-% IV SOLN
INTRAVENOUS | Status: AC
  Filled 2020-06-14: qty 100

## 2020-06-14 MED ORDER — MIDAZOLAM HCL 2 MG/2ML IJ SOLN
INTRAMUSCULAR | Status: AC | PRN
  Administered 2020-06-14 (×2): 1 mg via INTRAVENOUS

## 2020-06-14 MED ORDER — LIDOCAINE HCL 1 % IJ SOLN
INTRAMUSCULAR | Status: AC
  Filled 2020-06-14: qty 20

## 2020-06-14 MED ORDER — MIDAZOLAM HCL 2 MG/2ML IJ SOLN
INTRAMUSCULAR | Status: AC
  Filled 2020-06-14: qty 2

## 2020-06-14 MED ORDER — CEFAZOLIN SODIUM-DEXTROSE 2-4 GM/100ML-% IV SOLN
2.0000 g | INTRAVENOUS | Status: AC
  Administered 2020-06-14: 2 g via INTRAVENOUS

## 2020-06-14 NOTE — Discharge Instructions (Signed)
Implanted Port Removal, Care After This sheet gives you information about how to care for yourself after your procedure. Your health care provider may also give you more specific instructions. If you have problems or questions, contact your health care provider. What can I expect after the procedure? After the procedure, it is common to have:  Soreness or pain near your incision.  Some swelling or bruising near your incision. Follow these instructions at home: Medicines  Take over-the-counter and prescription medicines only as told by your health care provider.  If you were prescribed an antibiotic medicine, take it as told by your health care provider. Do not stop taking the antibiotic even if you start to feel better. Bathing  Do not take baths, swim, or use a hot tub until your health care provider approves. Ask your health care provider if you can take showers. You may only be allowed to take sponge baths. Incision care  Follow instructions from your health care provider about how to take care of your incision. Make sure you: ? Wash your hands with soap and water before you change your bandage (dressing). If soap and water are not available, use hand sanitizer. ? Change your dressing as told by your health care provider. ? Keep your dressing dry. ? Leave stitches (sutures), skin glue, or adhesive strips in place. These skin closures may need to stay in place for 2 weeks or longer. If adhesive strip edges start to loosen and curl up, you may trim the loose edges. Do not remove adhesive strips completely unless your health care provider tells you to do that.  Check your incision area every day for signs of infection. Check for: ? More redness, swelling, or pain. ? More fluid or blood. ? Warmth. ? Pus or a bad smell.   Driving  Do not drive for 24 hours if you were given a medicine to help you relax (sedative) during your procedure.  If you did not receive a sedative, ask your  health care provider when it is safe to drive.   Activity  Return to your normal activities as told by your health care provider. Ask your health care provider what activities are safe for you.  Do not lift anything that is heavier than 10 lb (4.5 kg), or the limit that you are told, until your health care provider says that it is safe.  Do not do activities that involve lifting your arms over your head. General instructions  Do not use any products that contain nicotine or tobacco, such as cigarettes and e-cigarettes. These can delay healing. If you need help quitting, ask your health care provider.  Keep all follow-up visits as told by your health care provider. This is important. Contact a health care provider if:  You have more redness, swelling, or pain around your incision.  You have more fluid or blood coming from your incision.  Your incision feels warm to the touch.  You have pus or a bad smell coming from your incision.  You have pain that is not relieved by your pain medicine. Get help right away if you have:  A fever or chills.  Chest pain.  Difficulty breathing. Summary  After the procedure, it is common to have pain, soreness, swelling, or bruising near your incision.  If you were prescribed an antibiotic medicine, take it as told by your health care provider. Do not stop taking the antibiotic even if you start to feel better.  Do not drive for   24 hours if you were given a sedative during your procedure.  Return to your normal activities as told by your health care provider. Ask your health care provider what activities are safe for you. This information is not intended to replace advice given to you by your health care provider. Make sure you discuss any questions you have with your health care provider. Document Revised: 07/02/2017 Document Reviewed: 07/02/2017 Elsevier Patient Education  2021 Elsevier Inc.  Moderate Conscious Sedation, Adult, Care  After This sheet gives you information about how to care for yourself after your procedure. Your health care provider may also give you more specific instructions. If you have problems or questions, contact your health care provider. What can I expect after the procedure? After the procedure, it is common to have:  Sleepiness for several hours.  Impaired judgment for several hours.  Difficulty with balance.  Vomiting if you eat too soon. Follow these instructions at home: For the time period you were told by your health care provider:  Rest.  Do not participate in activities where you could fall or become injured.  Do not drive or use machinery.  Do not drink alcohol.  Do not take sleeping pills or medicines that cause drowsiness.  Do not make important decisions or sign legal documents.  Do not take care of children on your own.      Eating and drinking  Follow the diet recommended by your health care provider.  Drink enough fluid to keep your urine pale yellow.  If you vomit: ? Drink water, juice, or soup when you can drink without vomiting. ? Make sure you have little or no nausea before eating solid foods.   General instructions  Take over-the-counter and prescription medicines only as told by your health care provider.  Have a responsible adult stay with you for the time you are told. It is important to have someone help care for you until you are awake and alert.  Do not smoke.  Keep all follow-up visits as told by your health care provider. This is important. Contact a health care provider if:  You are still sleepy or having trouble with balance after 24 hours.  You feel light-headed.  You keep feeling nauseous or you keep vomiting.  You develop a rash.  You have a fever.  You have redness or swelling around the IV site. Get help right away if:  You have trouble breathing.  You have new-onset confusion at home. Summary  After the procedure,  it is common to feel sleepy, have impaired judgment, or feel nauseous if you eat too soon.  Rest after you get home. Know the things you should not do after the procedure.  Follow the diet recommended by your health care provider and drink enough fluid to keep your urine pale yellow.  Get help right away if you have trouble breathing or new-onset confusion at home. This information is not intended to replace advice given to you by your health care provider. Make sure you discuss any questions you have with your health care provider. Document Revised: 09/16/2019 Document Reviewed: 04/14/2019 Elsevier Patient Education  2021 Elsevier Inc.  

## 2020-06-14 NOTE — Procedures (Signed)
Interventional Radiology Procedure Note  Procedure: Port removal  Indication: Cervical Cancer.  Chemotherapy completed.  Findings: Right chest port removed with difficulty  Complications: None  EBL: < 10 mL  Miachel Roux, MD [phone]

## 2020-06-14 NOTE — H&P (Signed)
Chief Complaint: Patient was seen in consultation today for cervical cancer in remission/Port-a-cath removal.  Referring Physician(s): Heath Lark  Supervising Physician: Mir, Sharen Heck  Patient Status: [facility] - Out-pt  History of Present Illness: Anne Harris is a 60 y.o. female with a past medical history of hypertension, pulmonary nodules, cervical cancer, and anemia. She was unfortunately diagnosed with cervical cancer in 11/2019. Her cancer is managed by Dr. Alvy Bimler. She had a Port-a-cath placed in IR 12/22/2019. She has undergone systemic chemotherapy as management and is currently in remission at this time.  IR consulted by Dr. Alvy Bimler for possible Port-a-cath removal. Patient awake and alert sitting in bed with no complaints at this time. Denies fever, chills, chest pain, dyspnea, abdominal pain, or headache.   Past Medical History:  Diagnosis Date  . Abnormal Pap smear of cervix   . Anemia   . 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 Hypertension    followed by pcp  . Mediastinal adenopathy   . Obesity, Class III, BMI 40-49.9 (morbid obesity) ([place])   . Pancytopenia, acquired ([place])   . Pulmonary nodules    bilateral  . Wears glasses     Past Surgical History:  Procedure Laterality Date  . BRONCHIAL BIOPSY  12/15/2019   Procedure: BRONCHIAL BIOPSIES;  Surgeon: Garner Nash, DO;  Location: [place] [facility];  Service: Pulmonary;;  . BRONCHIAL NEEDLE ASPIRATION BIOPSY  12/15/2019   Procedure: BRONCHIAL NEEDLE ASPIRATION BIOPSIES;  Surgeon: Garner Nash, DO;  Location: [place] [facility];  Service: Pulmonary;;  . BRONCHIAL WASHINGS  12/15/2019   Procedure: BRONCHIAL WASHINGS;  Surgeon: Garner Nash, DO;  Location: [place];  Service: Pulmonary;;  . CERVICAL BIOPSY    . ENDOBRONCHIAL ULTRASOUND N/A 12/15/2019   Procedure:  ENDOBRONCHIAL ULTRASOUND;  Surgeon: Garner Nash, DO;  Location: [place];  Service: Pulmonary;  Laterality: N/A;  . IR IMAGING GUIDED PORT INSERTION  12/22/2019  . OPERATIVE ULTRASOUND N/A 02/27/2020   Procedure: OPERATIVE ULTRASOUND;  Surgeon: Gery Pray, MD;  Location: [facility];  Service: Urology;  Laterality: N/A;  . OPERATIVE ULTRASOUND N/A 03/05/2020   Procedure: OPERATIVE ULTRASOUND;  Surgeon: Gery Pray, MD;  Location: [facility];  Service: Urology;  Laterality: N/A;  . OPERATIVE ULTRASOUND N/A 03/14/2020   Procedure: OPERATIVE ULTRASOUND;  Surgeon: Gery Pray, MD;  Location: [facility];  Service: Urology;  Laterality: N/A;  . OPERATIVE ULTRASOUND N/A 03/23/2020   Procedure: OPERATIVE ULTRASOUND;  Surgeon: Gery Pray, MD;  Location: [facility];  Service: Urology;  Laterality: N/A;  . OPERATIVE ULTRASOUND N/A 03/27/2020   Procedure: OPERATIVE ULTRASOUND;  Surgeon: Gery Pray, MD;  Location: [facility];  Service: Urology;  Laterality: N/A;  . TANDEM RING INSERTION N/A 02/27/2020   Procedure: TANDEM RING INSERTION;  Surgeon: Gery Pray, MD;  Location: [facility];  Service: Urology;  Laterality: N/A;  . TANDEM RING INSERTION N/A 03/05/2020   Procedure: TANDEM RING INSERTION;  Surgeon: Gery Pray, MD;  Location: [facility];  Service: Urology;  Laterality: N/A;  . TANDEM RING INSERTION N/A 03/14/2020   Procedure: TANDEM RING INSERTION FOR HIGH DOSE RADIATION THERAPY;  Surgeon: Gery Pray, MD;  Location: [facility];  Service: Urology;  Laterality: N/A;  . TANDEM RING INSERTION N/A 03/23/2020   Procedure: TANDEM RING  INSERTION;  Surgeon: Gery Pray, MD;  [facility];  Service: Urology;  Laterality: N/A;  . TANDEM RING INSERTION N/A 03/27/2020   Procedure: TANDEM RING INSERTION;  Surgeon: Gery Pray, MD;   Location: [facility];  Service: Urology;  Laterality: N/A;  ULTRASOUND AND TECH    Allergies: Nsaids  Medications: Prior to Admission medications   Medication Sig Start Date End Date Taking? Authorizing Provider  acetaminophen (TYLENOL) 500 MG tablet Take 1,000 mg by mouth every 6 (six) hours as needed for moderate pain. Patient not taking: Reported on 06/07/2020    [provider]  amLODipine (NORVASC) 5 MG tablet Take 1 tablet (5 mg total) by mouth daily. 12/30/19 03/29/20  Gildardo Pounds, NP  diphenoxylate-atropine (LOMOTIL) 2.5-0.025 MG tablet Take 1 tablet by mouth 4 (four) times daily as needed for diarrhea or loose stools. Patient not taking: Reported on 06/07/2020 01/25/20   Heath Lark, MD  HYDROcodone-acetaminophen (NORCO/VICODIN) 5-325 MG tablet Take 1 tablet by mouth every 12 (twelve) hours as needed. Patient not taking: Reported on 06/07/2020 02/23/20   Heath Lark, MD  loperamide (IMODIUM) 2 MG capsule Take by mouth as needed for diarrhea or loose stools. Patient not taking: Reported on 06/07/2020    [provider]  ondansetron (ZOFRAN ODT) 4 MG disintegrating tablet Take 1 tablet (4 mg total) by mouth every 8 (eight) hours as needed for nausea or vomiting. Patient not taking: Reported on 06/07/2020 01/01/20   Henderly, Britni A, PA-C  prochlorperazine (COMPAZINE) 10 MG tablet Take 1 tablet (10 mg total) by mouth every 6 (six) hours as needed (Nausea or vomiting). 12/14/19 05/22/20  Heath Lark, MD     Family History  Problem Relation Age of Onset  . Colon cancer Mother 30  . Ovarian cancer Neg Hx   . Uterine cancer Neg Hx   . Breast cancer Neg Hx   . Cervical cancer Neg Hx     Social History   Socioeconomic History  . Marital status: Single    Spouse name: Not on file  . Number of children: 6  . Years of education: Not on file  . Highest education level: High school graduate  Occupational History  . Occupation: care giver  Tobacco Use  .  Smoking status: Never Smoker  . Smokeless tobacco: Never Used  Vaping Use  . Vaping Use: Never used  Substance and Sexual Activity  . Alcohol use: Yes    Comment: social  . Drug use: Never  . Sexual activity: Not Currently  Other Topics Concern  . Not on file  Social History Narrative  . Not on file   Social Determinants of Health   Financial Resource Strain: Not on file  Food Insecurity: Not on file  Transportation Needs: No Transportation Needs  . Lack of Transportation (Medical): No  . Lack of Transportation (Non-Medical): No  Physical Activity: Not on file  Stress: Not on file  Social Connections: Not on file     Review of Systems: A 12 point ROS discussed and pertinent positives are indicated in the HPI above.  All other systems are negative.  Review of Systems  Constitutional: Negative for chills and fever.  Respiratory: Negative for shortness of breath and wheezing.   Cardiovascular: Negative for chest pain and palpitations.  Gastrointestinal: Negative for abdominal pain.  Neurological: Negative for headaches.  Psychiatric/Behavioral: Negative for behavioral problems and confusion.    Vital Signs: BP (!) 150/84   Pulse 78  Temp 98.4 F (36.9 C) (Oral)   Resp 16   SpO2 99%   Physical Exam Vitals and nursing note reviewed.  Constitutional:      General: She is not in acute distress.    Appearance: Normal appearance.  Cardiovascular:     Rate and Rhythm: Normal rate and regular rhythm.     Heart sounds: Normal heart sounds. No murmur heard.   Pulmonary:     Effort: Pulmonary effort is normal. No respiratory distress.     Breath sounds: Normal breath sounds. No wheezing.  Skin:    General: Skin is warm and dry.     Comments: Right chest at site of Port-a-cath well healed without tenderness, erythema, drainage, or active bleeding.  Neurological:     Mental Status: She is alert and oriented to person, place, and time.      MD Evaluation Airway:  WNL Heart: WNL Abdomen: WNL Chest/ Lungs: WNL ASA  Classification: 3 Mallampati/Airway Score: Two   Imaging: NM PET Image Restage (PS) Skull Base to Thigh  Result Date: 05/21/2020 CLINICAL DATA:  Subsequent treatment strategy for cervical cancer. EXAM: NUCLEAR MEDICINE PET SKULL BASE TO THIGH TECHNIQUE: 10.64 mCi F-18 FDG was injected intravenously. Full-ring PET imaging was performed from the skull base to thigh after the radiotracer. CT data was obtained and used for attenuation correction and anatomic localization. Fasting blood glucose: 93 mg/dl COMPARISON:  PET-CT 11/29/2019. Abdominopelvic CT 01/01/2020. Cardiac CT 05/01/2008. FINDINGS: Mediastinal blood pool activity: SUV max 2.1 NECK: No hypermetabolic cervical lymph nodes are identified.There are no lesions of the pharyngeal mucosal space. Hypermetabolic activity again demonstrated associated with the mandibular and maxillary teeth consistent with periodontal disease. Incidental CT findings: none CHEST: Again demonstrated are multiple hypermetabolic mediastinal and hilar lymph nodes bilaterally. These lymph nodes are partially calcified on CT, measuring up to 2.2 cm in the precarinal station (image 58/4). This node is hypermetabolic with an SUV max of 11.2 (previously 15.6). The overall extent this hypermetabolic adenopathy has improved. Small hypermetabolic right internal mammary node has slightly improved (SUV max 5.1, previously 7.8). No axillary adenopathy. Grossly stable scattered subpleural nodularity in both lungs without significant hypermetabolic activity. Largest nodules include a 6 mm nodule associated with the right minor fissure on image 38/8 and a 5 mm left lower lobe nodule on image 40/8. Incidental CT findings: Right IJ Port-A-Cath extends to the upper right atrium. ABDOMEN/PELVIS: The previously demonstrated large hypermetabolic cervical mass has resolved. There are no residual hypermetabolic pelvic lymph nodes. Small mildly  hypermetabolic portacaval nodes are likely reactive (SUV max 5.2). There is no hypermetabolic activity within the liver, spleen or adrenal glands. Incidental CT findings: Probable exophytic left fundal fibroid measuring 5.0 cm on image 149/4 is grossly stable. This demonstrates low-level peripheral metabolic activity. No significant residual distension of the endometrial cavity. Calcified gallstones are again noted. SKELETON: Mildly heterogeneous marrow activity has improved. No focal hypermetabolic activity to suggest metastatic disease. Incidental CT findings: none IMPRESSION: 1. Compared with the previous PET-CT of 6 months ago, the large hypermetabolic cervical mass has resolved consistent with response to therapy. 2. The previously demonstrated partially calcified mediastinal and hilar adenopathy has mildly improved. 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 3. Stable incidental findings including a probable exophytic fundal fibroid, cholelithiasis and periodontal disease. Electronically Signed   By: Richardean Sale M.D.   On: 05/21/2020 15:58  Labs:  CBC: Recent Labs    01/31/20 0945 02/27/20 0829 04/05/20 1245 06/14/20 1329  WBC 2.8* 4.7 6.3 5.4  HGB 9.1* 10.9* 9.9* 10.6*  HCT 29.5* 35.0* 30.5* 34.1*  PLT 155 269 193 225    COAGS: Recent Labs    12/15/19 1207  INR 1.1  APTT 27    BMP: Recent Labs    01/17/20 0952 01/24/20 1017 01/31/20 0945 02/27/20 0829 04/05/20 1245  NA 138 139 139 144 141  K 3.6 3.5 3.4* 3.6 3.3*  CL 104 103 103 105 108  CO2 25 29 28 25 25   GLUCOSE 100* 109* 95 97 96  BUN 5* 7 6 <5* 5*  CALCIUM 9.3 9.3 9.4 9.4 8.9  CREATININE 0.75 0.83 0.78 0.78 0.73  GFRNONAA >60 >60 >60 >60 >60  GFRAA >60 >60 >60 >60  --     LIVER FUNCTION TESTS: Recent Labs    12/15/19 1207 12/22/19 1225  12/29/19 0746 01/01/20 1113  BILITOT 0.5 0.7 0.3 0.6  AST 19 22 13* 12*  ALT 13 13 8 10   ALKPHOS 63 76 72 61  PROT 7.2 8.6* 6.9 7.1  ALBUMIN 3.5 4.3 3.1* 3.2*     Assessment and Plan:  History of cervical cancer s/p systemic chemotherapy (via Port-a-cath placed in IR 12/22/2019), currently in remission. Plan for Port-a-cath removal today in IR. Patient is NPO. Afebrile.  Risks and benefits of Port-a-catheter removal were discussed with the patient including, but not limited to bleeding and infection. All of the patient's questions were answered, patient is agreeable to proceed. Consent signed and in chart.   Thank you for this interesting consult.  I greatly enjoyed meeting Anne Harris and look forward to participating in their care.  A copy of this report was sent to the requesting provider on this date.  Electronically Signed: Earley Abide, PA-C 06/14/2020, 1:53 PM   I spent a total of 25 Minutes in face to face in clinical consultation, greater than 50% of which was counseling/coordinating care for cervical cancer in remission/Port-a-cath removal.

## 2020-07-27 ENCOUNTER — Ambulatory Visit: Payer: BC Managed Care – PPO | Admitting: Nurse Practitioner

## 2020-08-29 ENCOUNTER — Encounter: Payer: Self-pay | Admitting: Radiation Oncology

## 2020-09-06 ENCOUNTER — Encounter: Payer: Self-pay | Admitting: Radiation Oncology

## 2020-09-06 ENCOUNTER — Ambulatory Visit
Admission: RE | Admit: 2020-09-06 | Discharge: 2020-09-06 | Disposition: A | Discharge status: Home / Self Care | Payer: Medicaid Other | Source: Ambulatory Visit | Attending: Radiation Oncology | Admitting: Radiation Oncology

## 2020-09-06 ENCOUNTER — Other Ambulatory Visit: Payer: Self-pay

## 2020-09-06 VITALS — BP 151/89 | HR 86 | Temp 97.8°F | Resp 20 | Ht 65.0 in | Wt 238.8 lb

## 2020-09-06 DIAGNOSIS — R590 Localized enlarged lymph nodes: Secondary | ICD-10-CM | POA: Diagnosis not present

## 2020-09-06 DIAGNOSIS — Z8541 Personal history of malignant neoplasm of cervix uteri: Secondary | ICD-10-CM | POA: Insufficient documentation

## 2020-09-06 DIAGNOSIS — Z79899 Other long term (current) drug therapy: Secondary | ICD-10-CM | POA: Insufficient documentation

## 2020-09-06 DIAGNOSIS — Z923 Personal history of irradiation: Secondary | ICD-10-CM | POA: Insufficient documentation

## 2020-09-06 DIAGNOSIS — C539 Malignant neoplasm of cervix uteri, unspecified: Secondary | ICD-10-CM

## 2020-09-06 NOTE — Progress Notes (Signed)
Radiation Oncology         [phone] ________________________________  Name: Anne Harris MRN: [id number]  Date: 09/06/2020  DOB: 01-May-1961  Follow-Up Visit Note  CC: Anne Pounds, NP  Anne Pounds, NP    ICD-10-CM   1. 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 PET scan on 05/21/2020 showed resolution of the large hypermetabolic cervical mass, consistent with response to therapy. The previously demonstrated partially calcified mediastinal and hilar adenopathy had mildly improved. 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  The patient was last seen by Dr. Berline Harris on 06/08/2020, at which time there was no evidence of disease on exam.  She missed her 1 month follow-up appointment in radiation oncology and therefore did not receive her vaginal dilator.  When she saw Dr. Berline Harris she was given a vaginal dilator and given instructions as well as the importance of using this after her radiation therapy however the patient only used it once.  She reports not feeling comfortable using the equipment.  I did stress to her the importance of using this equipment and she will try restarting.  She has 3 different sizes to the use.  On review of systems, she reports no new medical issues.. She denies  denies any problems with diarrhea or constipation.  She denies any rectal bleeding or urination difficulties.  She denies any vaginal discharge or bleeding..  ALLERGIES:  is allergic to nsaids.  Meds: Current Outpatient Medications  Medication Sig Dispense Refill  . acetaminophen (TYLENOL) 500 MG tablet Take 1,000 mg by mouth every 6 (six) hours as needed for moderate pain.    Marland Kitchen amLODipine (NORVASC) 5 MG tablet Take 1 tablet (5 mg total) by mouth daily. 90 tablet 1  . diphenoxylate-atropine (LOMOTIL) 2.5-0.025 MG tablet Take 1 tablet by mouth 4 (four) times daily as needed for diarrhea or loose stools. (Patient not taking: No sig reported) 60 tablet 0  . HYDROcodone-acetaminophen (NORCO/VICODIN) 5-325 MG tablet Take 1 tablet by mouth every 12 (twelve) hours as needed. (Patient not taking: No sig reported) 60 tablet 0  . loperamide (IMODIUM) 2 MG capsule Take by mouth as needed for diarrhea or loose stools. (Patient not taking: No sig reported)    . ondansetron (ZOFRAN ODT) 4 MG disintegrating tablet Take 1 tablet (4 mg total) by mouth every 8 (eight) hours as needed for nausea or vomiting. (Patient not taking: No sig reported) 20 tablet 0   No current facility-administered medications for this encounter.    Physical Findings: The patient is in no acute distress. Patient is alert and oriented.  height is 5\' 5"  (1.651 m) and weight is 238 lb 12.8 oz (108.3 kg). Her temperature is 97.8 F (36.6 C). Her blood pressure is 151/89 (abnormal) and her pulse is 86. Her respiration is 20 and oxygen saturation is 100%. No significant  changes. Lungs are clear to auscultation bilaterally. Heart has regular rate and rhythm. No palpable cervical, supraclavicular, or axillary adenopathy. Abdomen soft, non-tender, normal bowel sounds. On pelvic examination the external genitalia were unremarkable. A speculum exam was performed. There are no mucosal lesions noted in the vaginal vault.  The cervix is flush with  the upper vaginal area.  On bimanual and rectovaginal examination there were no pelvic masses appreciated.  Rectal sphincter tone good.  Lab Findings: Lab Results  Component Value Date   WBC 5.4 06/14/2020   HGB 10.6 (L) 06/14/2020   HCT 34.1 (L) 06/14/2020   MCV 85.9 06/14/2020   PLT 225 06/14/2020    Radiographic Findings: No results found.  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  PET scan showed no residual activity post treatment  Plan:  The patient will follow up with Dr. Berline Harris in three months and with radiation oncology in six months.  She will be due for a Pap smear 1 year out from her treatment  Total time spent in this encounter was 20 minutes which included reviewing the patient's most recent PET scan, follow-ups, physical examination, and documentation. ____________________________________   Blair Promise, PhD, MD  This document serves as a record of services personally performed by Gery Pray, MD. It was created on his behalf by Clerance Lav, a trained medical scribe. The creation of this record is based on the scribe's personal observations and the provider's statements to them. This document has been checked and approved by the attending provider.

## 2020-09-06 NOTE — Progress Notes (Signed)
Anne Harris is here today for follow up post radiation to the pelvic.  They completed their radiation on: 02/09/20  Does the patient complain of any of the following:  . Pain:Denie any pain . Abdominal bloating: no . Diarrhea/Constipation: no  . Nausea/Vomiting: no . Vaginal Discharge: no . Blood in Urine or Stool: no . Urinary Issues (dysuria/incomplete emptying/ incontinence/ increased frequency/urgency): Denies any urinary issues. . Does patient report using vaginal dilator 2-3 times a week and/or sexually active 2-3 weeks: Patient denies using vaginal dilator or being sexually active.  Marland Kitchen Post radiation skin changes: No    Additional comments if applicable:   Vitals:   09/06/20 0848  BP: (!) 151/89  Pulse: 86  Resp: 20  Temp: 97.8 F (36.6 C)  SpO2: 100%  Weight: 238 lb 12.8 oz (108.3 kg)  Height: 5\' 5"  (1.651 m)

## 2020-09-19 ENCOUNTER — Other Ambulatory Visit: Payer: Self-pay | Admitting: Nurse Practitioner

## 2020-09-19 DIAGNOSIS — I10 Essential (primary) hypertension: Secondary | ICD-10-CM

## 2020-09-19 NOTE — Telephone Encounter (Signed)
Patient needs appointment for further refills. Courtesy refill. Requested Prescriptions  Pending Prescriptions Disp Refills  . amLODipine (NORVASC) 5 MG tablet [Pharmacy Med Name: AMLODIPINE BESYLATE 5MG  TABLETS] 30 tablet 0    Sig: TAKE 1 TABLET(5 MG) BY MOUTH DAILY     Cardiovascular:  Calcium Channel Blockers Failed - 09/19/2020  3:08 AM      Failed - Last BP in normal range    BP Readings from Last 1 Encounters:  09/06/20 (!) 151/89         Failed - Valid encounter within last 6 months    Recent Outpatient Visits          8 months ago Encounter to establish care   [place] Rober[place], Vernia Buff, NP

## 2020-10-18 ENCOUNTER — Other Ambulatory Visit: Payer: Self-pay | Admitting: Nurse Practitioner

## 2020-10-18 DIAGNOSIS — I10 Essential (primary) hypertension: Secondary | ICD-10-CM

## 2020-10-18 NOTE — Telephone Encounter (Signed)
Requested medications are due for refill today yes  Requested medications are on the active medication list yes  Last refill 09/19/20  Last visit 12/2019  Future visit scheduled no  Notes to clinic Has already had a curtesy refill and there is no upcoming appointment scheduled.

## 2020-10-26 IMAGING — MG MM DIGITAL DIAGNOSTIC UNILAT*L* W/ TOMO W/ CAD
8 series · 9 of 24 positions shown · non-contrast
Comparison: Recent screening mammography

CLINICAL DATA: Patient was called back for focal asymmetries in the
upper outer breast

EXAM:
DIGITAL DIAGNOSTIC LEFT MAMMOGRAM WITH TOMO
ULTRASOUND LEFT BREAST

[L MLO synth-2D (1 of 2)]
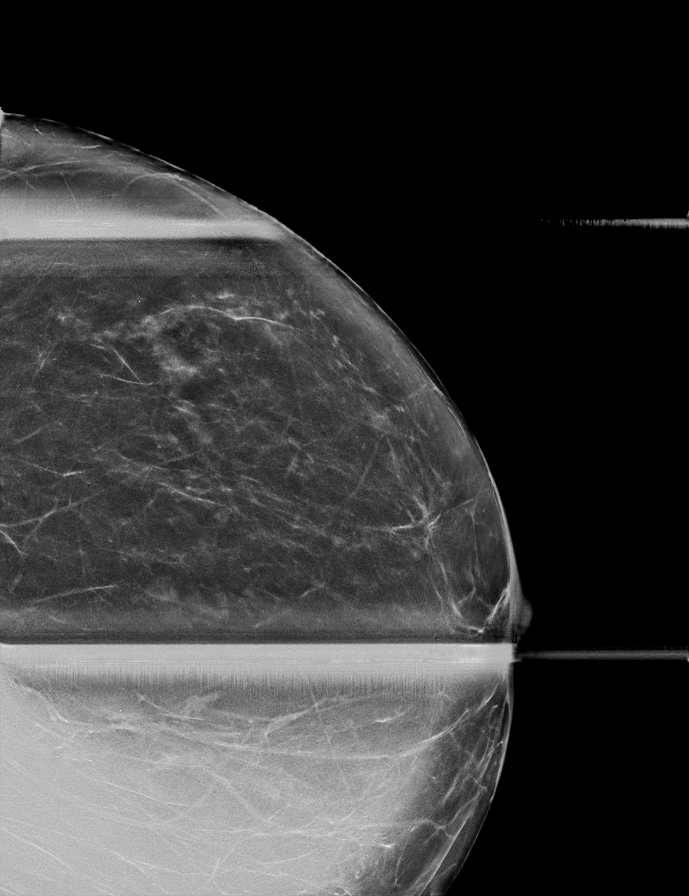

[L CC synth-2D (1 of 2)]
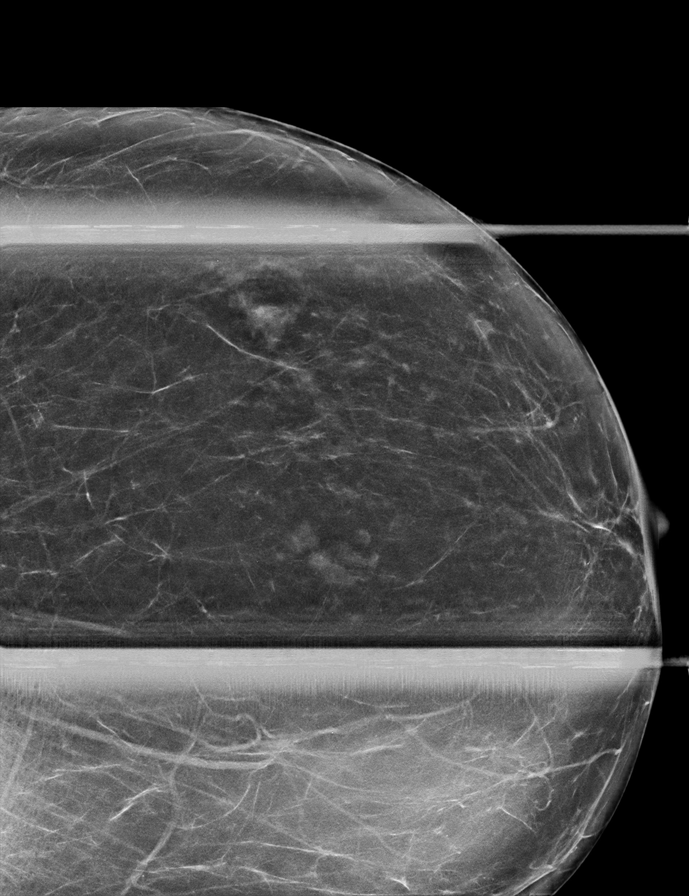

[L CC synth-2D (2 of 2)]
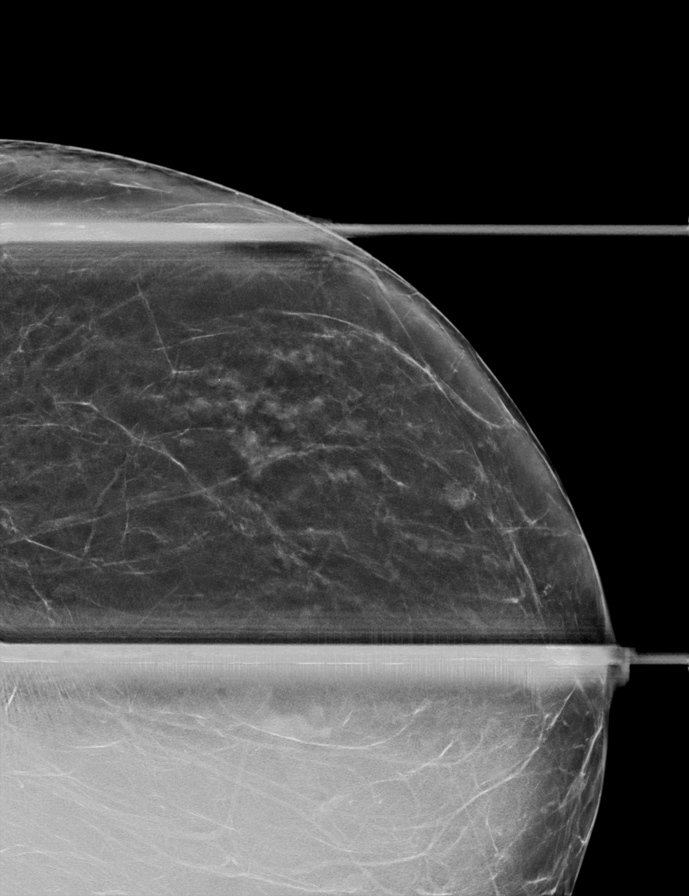

[L MLO synth-2D (2 of 2)]
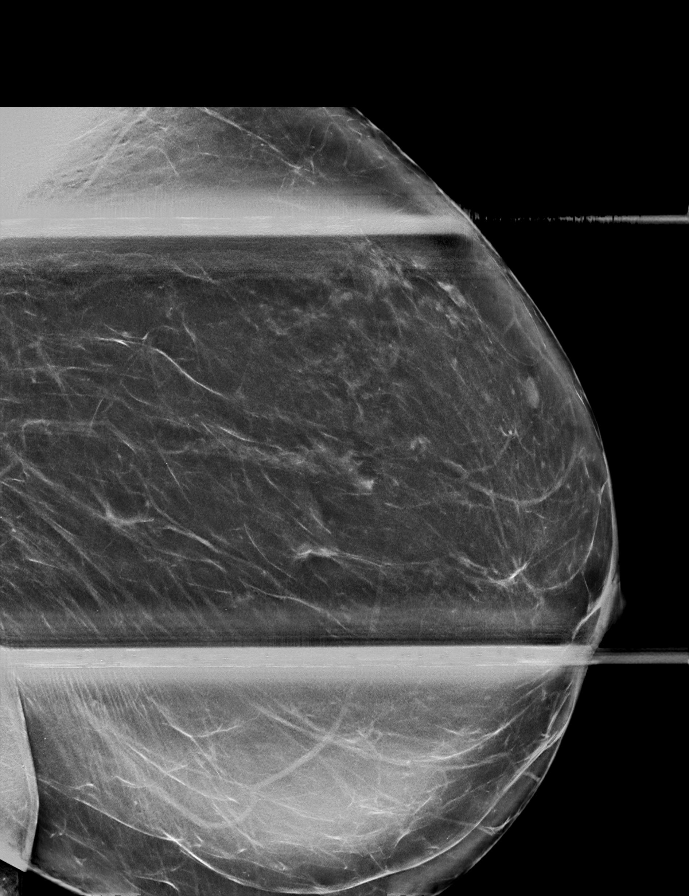

[L MLO tomo · 2 of 75 frames shown (1 of 2)]
[frame 25/75]
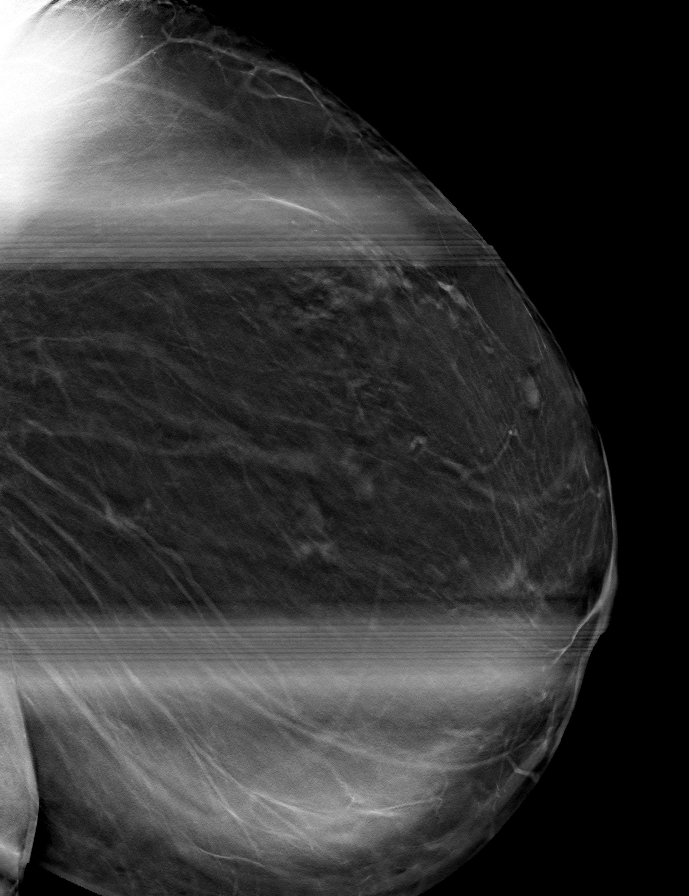
[frame 38/75]
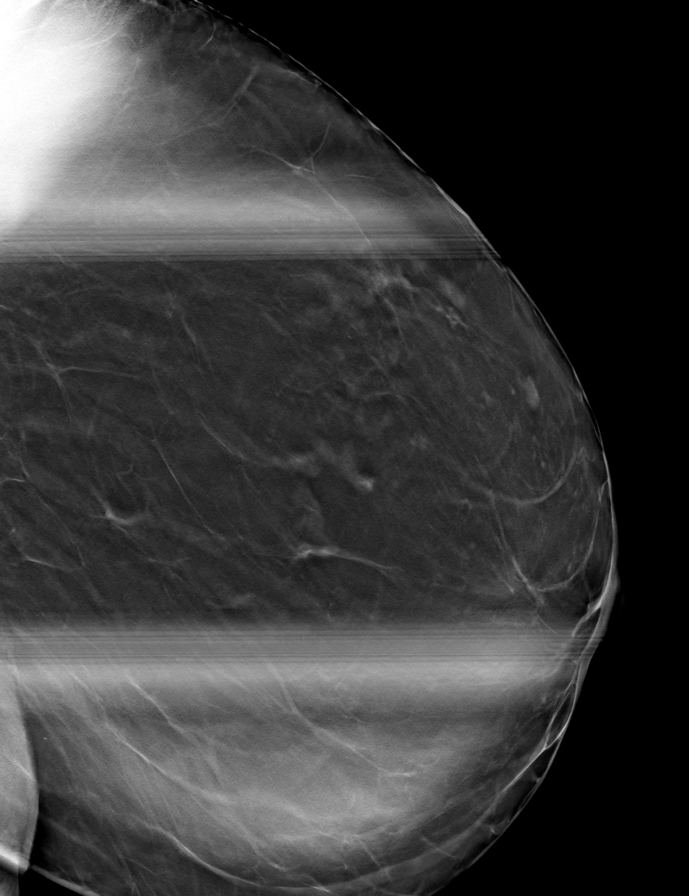

[L MLO tomo (2 of 2) · tomo slice 34/67.0]
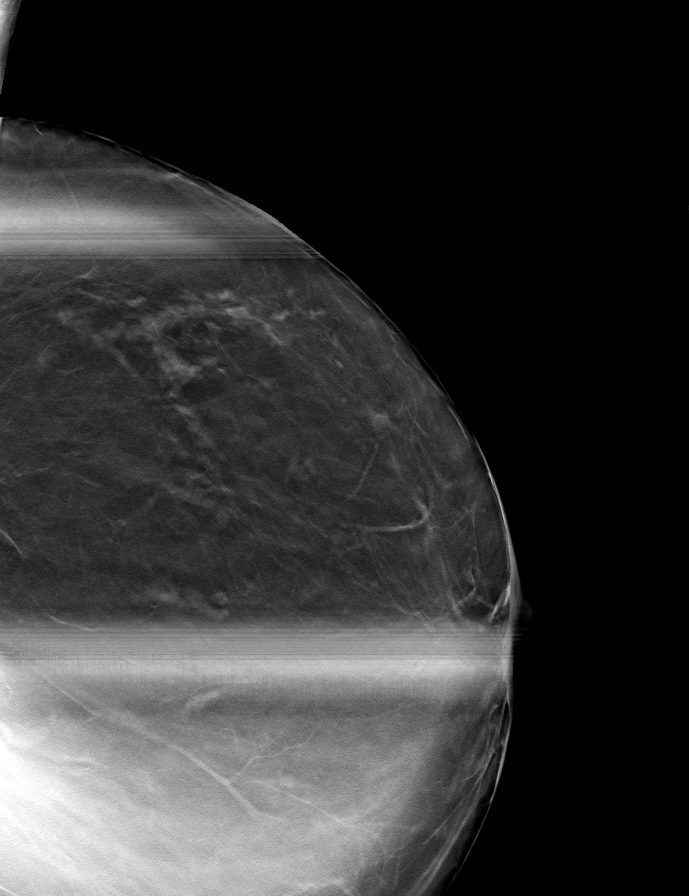

[L CC tomo (1 of 2) · tomo slice 33/66.0]
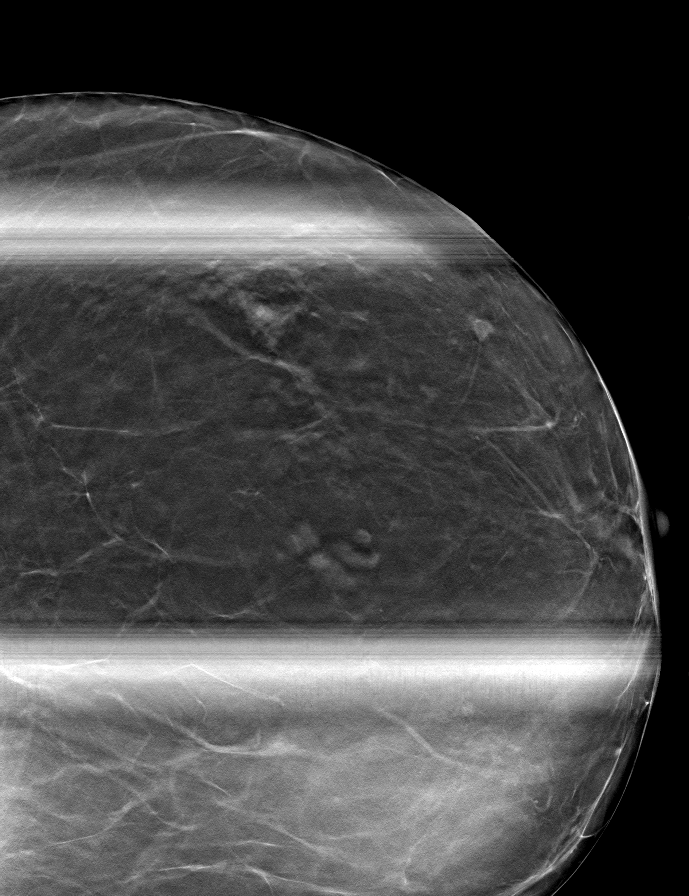

[L CC tomo (2 of 2) · tomo slice 31/62.0]
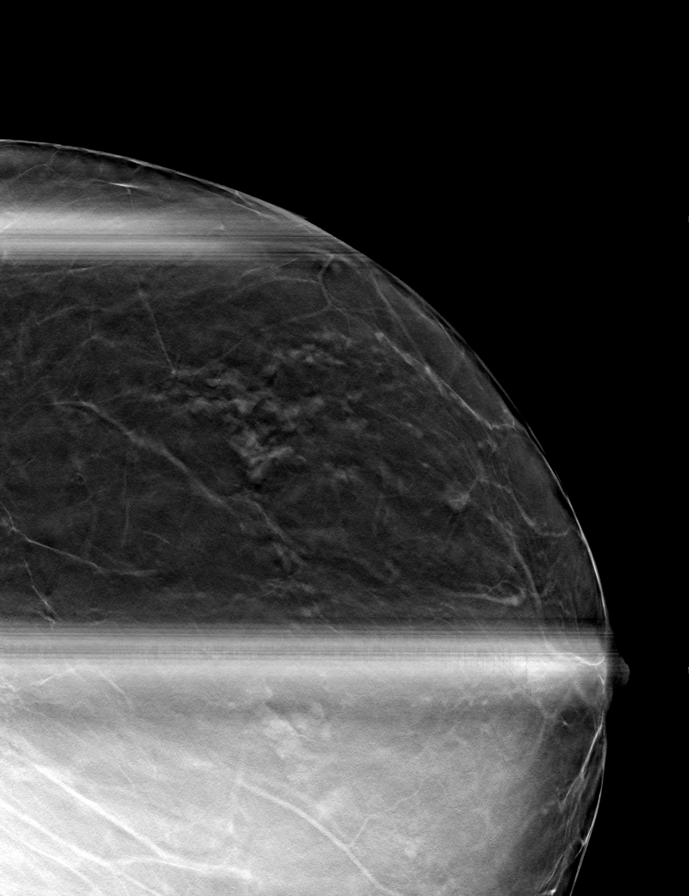

[9 of 24 positions shown; findings below may reference images not displayed]

ACR Breast Density Category b: There are scattered areas of
fibroglandular density.
FINDINGS: The focal asymmetries in the upper outer left breast persists on
additional imaging.

On physical exam, no suspicious lumps are identified.

Targeted ultrasound is performed, showing no sonographic correlate
for the left breast focal asymmetries.
IMPRESSION: Probably benign left breast focal asymmetries scattered throughout
the upper outer quadrant.

RECOMMENDATION:
Six-month follow-up mammography of the left breast to ensure
stability of the scattered focal asymmetries in the upper outer left
breast.

I have discussed the findings and recommendations with the patient.
If applicable, a reminder letter will be sent to the patient
regarding the next appointment.

BI-RADS CATEGORY  3: Probably benign.

## 2020-11-27 ENCOUNTER — Telehealth: Payer: Self-pay | Admitting: *Deleted

## 2020-11-27 NOTE — Telephone Encounter (Signed)
Return the patient's call moved appt from 6/29 to 7/8

## 2020-11-28 ENCOUNTER — Ambulatory Visit: Payer: BC Managed Care – PPO | Admitting: Gynecologic Oncology

## 2020-11-29 ENCOUNTER — Telehealth: Payer: Self-pay | Admitting: *Deleted

## 2020-11-29 NOTE — Telephone Encounter (Signed)
Per Melissa APP called and left the patient a message to call the office back. Patient's appt needs to be moved from 7/8 to 7/12

## 2020-11-29 NOTE — Telephone Encounter (Signed)
Patient called back and appat moved

## 2020-12-07 ENCOUNTER — Ambulatory Visit: Payer: Medicaid Other | Admitting: Gynecologic Oncology

## 2020-12-11 ENCOUNTER — Encounter: Payer: Self-pay | Admitting: Gynecologic Oncology

## 2020-12-11 ENCOUNTER — Other Ambulatory Visit: Payer: Self-pay

## 2020-12-11 ENCOUNTER — Inpatient Hospital Stay: Payer: Medicaid Other | Attending: Gynecologic Oncology | Admitting: Gynecologic Oncology

## 2020-12-11 VITALS — BP 179/94 | HR 86 | Temp 98.1°F | Resp 16 | Ht 65.0 in | Wt 254.0 lb

## 2020-12-11 DIAGNOSIS — Z923 Personal history of irradiation: Secondary | ICD-10-CM | POA: Insufficient documentation

## 2020-12-11 DIAGNOSIS — Z6841 Body Mass Index (BMI) 40.0 and over, adult: Secondary | ICD-10-CM | POA: Diagnosis not present

## 2020-12-11 DIAGNOSIS — Z79899 Other long term (current) drug therapy: Secondary | ICD-10-CM | POA: Diagnosis not present

## 2020-12-11 DIAGNOSIS — C539 Malignant neoplasm of cervix uteri, unspecified: Secondary | ICD-10-CM

## 2020-12-11 DIAGNOSIS — Z9221 Personal history of antineoplastic chemotherapy: Secondary | ICD-10-CM | POA: Diagnosis not present

## 2020-12-11 DIAGNOSIS — Z8541 Personal history of malignant neoplasm of cervix uteri: Secondary | ICD-10-CM | POA: Insufficient documentation

## 2020-12-11 DIAGNOSIS — I10 Essential (primary) hypertension: Secondary | ICD-10-CM | POA: Insufficient documentation

## 2020-12-11 NOTE — Patient Instructions (Signed)
Great to see you!  I do not see or feel any evidence that the cancer has come back.  You will see Dr. Sondra Come in 3 months and have a Pap test at that time.  I will see you back in January for your next follow-up.  Our schedule is not out past December, so if you have not heard from [place] in late November or early December, please call to get your next visit scheduled.  You develop any symptoms such as vaginal bleeding, discharge, pelvic pain, weight loss that is unintentional, or change to your bowel function before your next visit with me, please call to come in sooner.

## 2020-12-11 NOTE — Progress Notes (Signed)
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  On 6/10 she underwent Pap smear.  Last Pap smear had been 30 years prior and normal per patient.     11/21/2019 Pathology Results   A. CERVIX, BIOPSY:  -  Squamous cell carcinoma    11/29/2019 PET scan   1. Highly hypermetabolic cervical mass, with fluid distended endometrial cavity. Maximum SUV of the cervical mass is 25.8. 2. Left adnexal lesion versus more likely fundal fibroid, maximum SUV 6.0. Surveillance suggested. 3. There is low-grade activity in several abdominal lymph nodes including a borderline enlarged peripancreatic lymph node. However, there is striking activity in numerous enlarged thoracic lymph nodes, some of which have associated calcifications. It would be unusual to have this degree of thoracic adenopathy from cervical cancer without having a commensurate level of abdominopelvic adenopathy. This along with the calcifications of the lymph nodes raises the possibility of active granulomatous process (such as sarcoidosis) as a potential cause for the striking thoracic adenopathy. Concomitant lymphoma might also have a similar appearance. Sampling of thoracic lymph nodes may be helpful in differentiating metastatic disease other entities such as sarcoid or Lymphoma.  4. Mild pleural/subpleural nodularity in the lungs, merit surveillance. 5. Cholelithiasis.   11/30/2019 Cancer Staging   Staging form: Cervix Uteri, AJCC Version 9 - Clinical stage from 11/30/2019: FIGO Stage IIB (cT2b, cN0, cM0) - Signed by Heath Lark, MD on 11/30/2019    12/02/2019 Imaging   Ct abdomen and pelvis 1. Cervical soft  tissue mass as described in keeping with known malignancy. 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 2. Mildly rounded subcentimeter aortocaval and right pelvic sidewall lymph nodes. 3. A 5.3 x 3.9 cm left fundal exophytic soft tissue suboptimally characterized but possibly a fibroid. 4. Cholelithiasis. 5. No bowel obstruction. Normal appendix. 6. Small bilateral lung base subpleural nodules similar to prior PET CT. Clinical correlation and follow-up recommended.   12/22/2019 Procedure   Successful placement of a right internal jugular approach power injectable Port-A-Cath. The catheter is ready for immediate use.   12/28/2019 - 03/27/2020 Radiation Therapy   12/28/2019 to 02/09/2020 followed by five HDR brachytherapy treatments from 02/27/2020 to 03/27/2020   Site: Cervix Technique: 3D Total Dose (Gy): 45/45 Dose per Fx (Gy): 1.8 Completed Fx: 25/25 Beam Energies: 6X, 10X, 15X   Site: Cervix boost Technique: 3D Total Dose (Gy): 9/9 Dose per Fx (Gy): 1.8 Completed Fx: 5/5 Beam Energies: 15X   12/29/2019 - 01/27/2020 Chemotherapy   The patient had cisplatin for chemotherapy treatment.     05/22/2020 PET scan   1. Compared with the previous PET-CT of 6 months ago, the large hypermetabolic cervical mass has resolved consistent with response to therapy. 2. The previously demonstrated partially calcified mediastinal and hilar adenopathy has mildly improved. 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 3. Stable incidental findings including a probable exophytic fundal fibroid, cholelithiasis and periodontal disease.    Radiation Therapy   Pelvic radiation and HDR brachytherapy   06/15/2020 Procedure   Successful right IJ vein Port-A-Cath explant.     Interval History: Presents  today for  surveillance visit.  She notes overall doing well since her last exam.  She denies any vaginal bleeding or discharge.  She has been using her vaginal dilator once or twice a week.  She endorses normal bowel and bladder function.  She reports a good appetite without nausea or emesis.  She has been cooking a lot at home and has gained about 20 pounds.  She is hoping to take a beach trip in the near future.  Past Medical/Surgical History: Past Medical History:  Diagnosis Date   Abnormal Pap smear of cervix    Anemia    Cervical cancer [facility]) oncologist-- dr Alvy Bimler and dr Sondra Come   dx 05/ 2021, SCC cervical cancer, Stage 2B,  completed chemo 01-27-2020  and completed extenal beam radiation 02-09-2020   History of radiation therapy 02/27/2020-03/27/2020   Cervical- HDR- brachytherapy- Dr. Sondra Come   History of radiation therapy 12/28/2019-02/09/2020   Cervical IMRT   Dr Gery Pray   Hypertension    followed by pcp   Mediastinal adenopathy    Obesity, Class III, BMI 40-49.9 (morbid obesity) ([place])    Pancytopenia, acquired ([place])    Pulmonary nodules    bilateral   Wears glasses     Past Surgical History:  Procedure Laterality Date   BRONCHIAL BIOPSY  12/15/2019   Procedure: BRONCHIAL BIOPSIES;  Surgeon: Garner Nash, DO;  Location: [facility];  Service: Pulmonary;;   BRONCHIAL NEEDLE ASPIRATION BIOPSY  12/15/2019   Procedure: BRONCHIAL NEEDLE ASPIRATION BIOPSIES;  Surgeon: Garner Nash, DO;  Location: [facility];  Service: Pulmonary;;   BRONCHIAL WASHINGS  12/15/2019   Procedure: BRONCHIAL WASHINGS;  Surgeon: Garner Nash, DO;  Location: [facility];  Service: Pulmonary;;   CERVICAL BIOPSY     ENDOBRONCHIAL ULTRASOUND N/A 12/15/2019   Procedure: ENDOBRONCHIAL ULTRASOUND;  Surgeon: Garner Nash, DO;  Location: [facility];  Service: Pulmonary;  Laterality: N/A;   IR IMAGING GUIDED PORT INSERTION  12/22/2019   IR REMOVAL TUN ACCESS W/ PORT W/O FL MOD SED  06/14/2020    OPERATIVE ULTRASOUND N/A 02/27/2020   Procedure: OPERATIVE ULTRASOUND;  Surgeon: Gery Pray, MD;  Location: [facility];  Service: Urology;  Laterality: N/A;   OPERATIVE ULTRASOUND N/A 03/05/2020   Procedure: OPERATIVE ULTRASOUND;  Surgeon: Gery Pray, MD;  Location: [facility];  Service: Urology;  Laterality: N/A;   OPERATIVE ULTRASOUND N/A 03/14/2020   Procedure: OPERATIVE ULTRASOUND;  Surgeon: Gery Pray, MD;  Location: [facility];  Service: Urology;  Laterality: N/A;   OPERATIVE ULTRASOUND N/A 03/23/2020   Procedure: OPERATIVE ULTRASOUND;  Surgeon: Gery Pray, MD;  Location: [facility];  Service: Urology;  Laterality: N/A;   OPERATIVE ULTRASOUND N/A 03/27/2020   Procedure: OPERATIVE ULTRASOUND;  Surgeon: Gery Pray, MD;  Location: [facility];  Service: Urology;  Laterality: N/A;   TANDEM RING INSERTION N/A 02/27/2020   Procedure: TANDEM RING INSERTION;  Surgeon: Gery Pray, MD;  Location: [facility];  Service: Urology;  Laterality: N/A;   TANDEM RING INSERTION N/A 03/05/2020   Procedure: TANDEM RING INSERTION;  Surgeon: Gery Pray, MD;  Location: [facility];  Service: Urology;  Laterality: N/A;   TANDEM RING INSERTION N/A 03/14/2020   Procedure: TANDEM RING INSERTION FOR HIGH DOSE RADIATION THERAPY;  Surgeon: Gery Pray, MD;  Location: [facility];  Service: Urology;  Laterality: N/A;   TANDEM RING INSERTION N/A 03/23/2020   Procedure: TANDEM RING  INSERTION;  Surgeon: Gery Pray, MD;  Location: [facility];  Service: Urology;  Laterality: N/A;   TANDEM RING INSERTION N/A 03/27/2020   Procedure: TANDEM RING INSERTION;  Surgeon: Gery Pray, MD;  Location: [facility];  Service: Urology;  Laterality: N/A;  ULTRASOUND AND TECH    Family History  Problem Relation Age of Onset   Colon cancer Mother 68   Ovarian  cancer Neg Hx    Uterine cancer Neg Hx    Breast cancer Neg Hx    Cervical cancer Neg Hx     Social History   Socioeconomic History   Marital status: Single    Spouse name: Not on file   Number of children: 6   Years of education: Not on file   Highest education level: High school graduate  Occupational History   Occupation: care giver  Tobacco Use   Smoking status: Never   Smokeless tobacco: Never  Vaping Use   Vaping Use: Never used  Substance and Sexual Activity   Alcohol use: Yes    Comment: social   Drug use: Never   Sexual activity: Not Currently  Other Topics Concern   Not on file  Social History Narrative   Not on file   Social Determinants of Health   Financial Resource Strain: Not on file  Food Insecurity: Not on file  Transportation Needs: Not on file  Physical Activity: Not on file  Stress: Not on file  Social Connections: Not on file    Current Medications:  Current Outpatient Medications:    acetaminophen (TYLENOL) 500 MG tablet, Take 1,000 mg by mouth every 6 (six) hours as needed for moderate pain., Disp: , Rfl:    amLODipine (NORVASC) 5 MG tablet, TAKE 1 TABLET(5 MG) BY MOUTH DAILY, Disp: 30 tablet, Rfl: 0   diphenoxylate-atropine (LOMOTIL) 2.5-0.025 MG tablet, Take 1 tablet by mouth 4 (four) times daily as needed for diarrhea or loose stools. (Patient not taking: No sig reported), Disp: 60 tablet, Rfl: 0   HYDROcodone-acetaminophen (NORCO/VICODIN) 5-325 MG tablet, Take 1 tablet by mouth every 12 (twelve) hours as needed. (Patient not taking: No sig reported), Disp: 60 tablet, Rfl: 0   loperamide (IMODIUM) 2 MG capsule, Take by mouth as needed for diarrhea or loose stools. (Patient not taking: No sig reported), Disp: , Rfl:    ondansetron (ZOFRAN ODT) 4 MG disintegrating tablet, Take 1 tablet (4 mg total) by mouth every 8 (eight) hours as needed for nausea or vomiting. (Patient not taking: No sig reported), Disp: 20 tablet, Rfl: 0  Review of  Systems: Denies appetite changes, fevers, chills, fatigue, unexplained weight changes. Denies hearing loss, neck lumps or masses, mouth sores, ringing in ears or voice changes. Denies cough or wheezing.  Denies shortness of breath. Denies chest pain or palpitations. Denies leg swelling. Denies abdominal distention, pain, blood in stools, constipation, diarrhea, nausea, vomiting, or early satiety. Denies pain with intercourse, dysuria, frequency, hematuria or incontinence. Denies hot flashes, pelvic pain, vaginal bleeding or vaginal discharge.   Denies joint pain, back pain or muscle pain/cramps. Denies itching, rash, or wounds. Denies dizziness, headaches, numbness or seizures. Denies swollen lymph nodes or glands, denies easy bruising or bleeding. Denies anxiety, depression, confusion, or decreased concentration.  Physical Exam: BP (!) 179/94 (BP Location: Left Arm, Patient Position: Sitting)   Pulse 86   Temp 98.1 F (36.7 C) (Tympanic)   Resp 16   Ht 5\' 5"  (1.651 m)   Wt 254 lb (  115.2 kg)   SpO2 100%   BMI 42.27 kg/m  General: Alert, oriented, no acute distress. HEENT: Posterior oropharynx clear, sclera anicteric. Chest: Unlabored breathing on room air. Abdomen: Obese, soft, nontender.  Normoactive bowel sounds.  No masses or hepatosplenomegaly appreciated.   Extremities: Grossly normal range of motion.  Warm, well perfused.  No edema bilaterally. Skin: No rashes or lesions noted. Lymphatics: No cervical, supraclavicular, or inguinal adenopathy. GU: Normal appearing external genitalia without erythema, excoriation, or lesions.  Speculum exam reveals mildly atrophic vaginal mucosa, no discharge or bleeding.  Cervix is almost completely flush with the vaginal apex, no lesions or masses noted, somewhat atrophic with radiation changes.  Bimanual exam reveals smooth cervix and vagina, no masses or nodularity.  Rectovaginal exam confirms these findings, no parametrial involvement or  nodularity.  Laboratory & Radiologic Studies: No new  Assessment & Plan: Anne Harris is a 60 y.o. woman with Stage IIB SCC of the cervix who presents for surveillance visit.  The patient is doing very well and is NED on exam today.  She is approximately 9 months out from completion of definitive chemoradiation.  She is asymptomatic.  She has gained about 35 pounds in the last 6 months which she attributes to cooking a lot and eating more at home.  We discussed some strategies for decreasing her caloric intake.  Encouraged her to continue using vaginal dilators at home and we discussed the importance of vaginal dilator use to help prevent vaginal agglutination.  Per NCCN surveillance recommendations, we will continue visits every 3 months alternating between my office and radiation oncology.  She will get yearly Pap test, which she will be due for at her October visit with Dr. Sondra Come.  I will plan to see her back in January.  We discussed signs and symptoms that would be concerning for disease recurrence and she knows to call if she develops any of these.  Patient was given a survivorship care plan today.  32 minutes of total time was spent for this patient encounter, including preparation, face-to-face counseling with the patient and coordination of care, and documentation of the encounter.  Jeral Pinch, MD  Division of Gynecologic Oncology  Department of Obstetrics and Gynecology  [facility]

## 2020-12-27 ENCOUNTER — Other Ambulatory Visit: Payer: Self-pay

## 2020-12-27 ENCOUNTER — Encounter: Payer: Self-pay | Admitting: Physician Assistant

## 2020-12-27 ENCOUNTER — Ambulatory Visit: Payer: Medicaid Other | Attending: Physician Assistant | Admitting: Physician Assistant

## 2020-12-27 VITALS — BP 153/81 | HR 74 | Ht 65.0 in | Wt 255.8 lb

## 2020-12-27 DIAGNOSIS — C539 Malignant neoplasm of cervix uteri, unspecified: Secondary | ICD-10-CM | POA: Diagnosis not present

## 2020-12-27 DIAGNOSIS — Z79899 Other long term (current) drug therapy: Secondary | ICD-10-CM | POA: Diagnosis not present

## 2020-12-27 DIAGNOSIS — Z7182 Exercise counseling: Secondary | ICD-10-CM | POA: Diagnosis not present

## 2020-12-27 DIAGNOSIS — Z13220 Encounter for screening for lipoid disorders: Secondary | ICD-10-CM | POA: Diagnosis not present

## 2020-12-27 DIAGNOSIS — D638 Anemia in other chronic diseases classified elsewhere: Secondary | ICD-10-CM | POA: Diagnosis not present

## 2020-12-27 DIAGNOSIS — Z131 Encounter for screening for diabetes mellitus: Secondary | ICD-10-CM | POA: Diagnosis not present

## 2020-12-27 DIAGNOSIS — I10 Essential (primary) hypertension: Secondary | ICD-10-CM | POA: Insufficient documentation

## 2020-12-27 MED ORDER — AMLODIPINE BESYLATE 5 MG PO TABS
ORAL_TABLET | ORAL | 1 refills | Status: DC
Start: 2020-12-27 — End: 2021-04-23
  Filled 2020-12-27: qty 30, 30d supply, fill #0
  Filled 2021-02-08: qty 30, 30d supply, fill #1
  Filled 2021-03-13: qty 30, 30d supply, fill #2
  Filled 2021-04-16: qty 30, 30d supply, fill #3

## 2020-12-27 NOTE — Progress Notes (Signed)
Needs refill on BP medication out for 2 months.

## 2020-12-27 NOTE — Progress Notes (Signed)
Patient ID: Anne Harris, female   DOB: Oct 21, 1960, 60 y.o.   MRN: [id number]     Anne Harris, is a 60 y.o. female  [id number]  [id number]  DOB - 1960/09/29  Subjective:  Chief Complaint and HPI: Anne Harris is a 60 y.o. female here today for BP check and bloodwork.   Being followed by gyn for squamous cell carcinoma of the cervix.  Last check up was this month and she received a favorable report.    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  Out of BP meds for 2 months.  Denies CP/SOB/HA/dizziness  ED/Hospital notes reviewed.   Social History: Family history:  ROS:   Constitutional:  No f/c, No night sweats, No unexplained weight loss. EENT:  No vision changes, No blurry vision, No hearing changes. No mouth, throat, or ear problems.  Respiratory: No cough, No SOB Cardiac: No CP, no palpitations GI:  No abd pain, No N/V/D. GU: No Urinary s/sx Musculoskeletal: No joint pain Neuro: No headache, no dizziness, no motor weakness.  Skin: No rash Endocrine:  No polydipsia. No polyuria.  Psych: Denies SI/HI  No problems updated.  ALLERGIES: Allergies  Allergen Reactions   Nsaids     Not recommended per oncology due to HTN and potential for ARI    PAST MEDICAL HISTORY: Past Medical History:  Diagnosis Date   Abnormal Pap smear of cervix    Anemia    Cervical cancer [facility]) oncologist-- dr Alvy Bimler and dr Sondra Come   dx 05/ 2021, SCC cervical cancer, Stage 2B,  completed chemo 01-27-2020  and completed extenal beam radiation 02-09-2020   History of radiation therapy 02/27/2020-03/27/2020   Cervical- HDR- brachytherapy- Dr. Sondra Come   History of radiation therapy 12/28/2019-02/09/2020   Cervical IMRT   Dr Gery Pray   Hypertension    followed by pcp   Mediastinal adenopathy    Obesity, Class III, BMI 40-49.9 (morbid obesity) (HCC)    Pancytopenia, acquired ([place])    Pulmonary nodules    bilateral   Wears glasses     MEDICATIONS AT  HOME: Prior to Admission medications   Medication Sig Start Date End Date Taking? Authorizing Provider  acetaminophen (TYLENOL) 500 MG tablet Take 1,000 mg by mouth every 6 (six) hours as needed for moderate pain.   Yes [provider]  amLODipine (NORVASC) 5 MG tablet TAKE 1 TABLET(5 MG) BY MOUTH DAILY 12/27/20   Freeman Caldron M, PA-C  diphenoxylate-atropine (LOMOTIL) 2.5-0.025 MG tablet Take 1 tablet by mouth 4 (four) times daily as needed for diarrhea or loose stools. Patient not taking: No sig reported 01/25/20   Heath Lark, MD  HYDROcodone-acetaminophen (NORCO/VICODIN) 5-325 MG tablet Take 1 tablet by mouth every 12 (twelve) hours as needed. Patient not taking: No sig reported 02/23/20   Heath Lark, MD  loperamide (IMODIUM) 2 MG capsule Take by mouth as needed for diarrhea or loose stools. Patient not taking: No sig reported    [provider]  ondansetron (ZOFRAN ODT) 4 MG disintegrating tablet Take 1 tablet (4 mg total) by mouth every 8 (eight) hours as needed for nausea or vomiting. Patient not taking: No sig reported 01/01/20   Henderly, Britni A, PA-C  prochlorperazine (COMPAZINE) 10 MG tablet Take 1 tablet (10 mg total) by mouth every 6 (six) hours as needed (Nausea or vomiting). 12/14/19 05/22/20  Heath Lark, MD     Objective:  EXAM:   Vitals:   12/27/20 0904  BP: (!) 153/81  Pulse: 74  SpO2: 100%  Weight: 255 lb 12.8 oz (116 kg)  Height: '5\' 5"'$  (1.651 m)    General appearance : A&OX3. NAD. Non-toxic-appearing HEENT: Atraumatic and Normocephalic.  PERRLA. EOM intact.  Chest/Lungs:  Breathing-non-labored, Good air entry bilaterally, breath sounds normal without rales, rhonchi, or wheezing  CVS: S1 S2 regular, no murmurs, gallops, rubs  Extremities: Bilateral Lower Ext shows no edema, both legs are warm to touch with = pulse throughout Neurology:  CN II-XII grossly intact, Non focal.   Psych:  TP linear. J/I WNL. Normal speech. Appropriate eye contact and  affect.  Skin:  No Rash  Data Review No results found for: HGBA1C   Assessment & Plan   1. Essential hypertension Uncontrolled but off meds Resume amlodipine.  Check BP OOO(she can do this at work)to ensure control. We have discussed target BP range and blood pressure goal. I have advised patient to check BP regularly and to call us back or report to clinic if the numbers are consistently higher than 140/90. We discussed the importance of compliance with medical therapy and DASH diet recommended, consequences of uncontrolled hypertension discussed.  - Comprehensive metabolic panel - CBC with Differential/Platelet - amLODipine (NORVASC) 5 MG tablet; TAKE 1 TABLET(5 MG) BY MOUTH DAILY  Dispense: 90 tablet; Refill: 1  2. Anemia due to chronic illness - CBC with Differential/Platelet  3. Squamous cell carcinoma of cervix (HCC) Currently in remission  4. Screening for diabetes mellitus I have had a lengthy discussion and provided education about insulin resistance and the intake of too much sugar/refined carbohydrates.  I have advised the patient to work at a goal of eliminating sugary drinks, candy, desserts, sweets, refined sugars, processed foods, and white carbohydrates.  The patient expresses understanding.  - Hemoglobin A1c  5. Screening cholesterol level - Lipid panel     Patient have been counseled extensively about nutrition and exercise  Return in about 6 months (around 06/29/2021) for with PCP;  chronic conditions.  The patient was given clear instructions to go to ER or return to medical center if symptoms don't improve, worsen or new problems develop. The patient verbalized understanding. The patient was told to call to get lab results if they haven't heard anything in the next week.     Freeman Caldron, PA-C [facility] and [facility], [place]   12/27/2020, 9:18 AM

## 2020-12-28 LAB — COMPREHENSIVE METABOLIC PANEL
ALT: 9 IU/L (ref 0–32)
AST: 19 IU/L (ref 0–40)
Albumin/Globulin Ratio: 1.6 (ref 1.2–2.2)
Albumin: 4.1 g/dL (ref 3.8–4.8)
Alkaline Phosphatase: 98 IU/L (ref 44–121)
BUN/Creatinine Ratio: 11 — ABNORMAL LOW (ref 12–28)
BUN: 10 mg/dL (ref 8–27)
Bilirubin Total: 0.3 mg/dL (ref 0.0–1.2)
CO2: 24 mmol/L (ref 20–29)
Calcium: 9.4 mg/dL (ref 8.7–10.3)
Chloride: 103 mmol/L (ref 96–106)
Creatinine, Ser: 0.87 mg/dL (ref 0.57–1.00)
Globulin, Total: 2.6 g/dL (ref 1.5–4.5)
Glucose: 103 mg/dL — ABNORMAL HIGH (ref 65–99)
Potassium: 4 mmol/L (ref 3.5–5.2)
Sodium: 141 mmol/L (ref 134–144)
Total Protein: 6.7 g/dL (ref 6.0–8.5)
eGFR: 76 mL/min/{1.73_m2} (ref >59)

## 2020-12-28 LAB — CBC WITH DIFFERENTIAL/PLATELET
Basophils Absolute: 0 10*3/uL (ref 0.0–0.2)
Basos: 1 %
EOS (ABSOLUTE): 0.1 10*3/uL (ref 0.0–0.4)
Eos: 3 %
Hematocrit: 36.8 % (ref 34.0–46.6)
Hemoglobin: 11.6 g/dL (ref 11.1–15.9)
Immature Grans (Abs): 0 10*3/uL (ref 0.0–0.1)
Immature Granulocytes: 0 %
Lymphocytes Absolute: 0.5 10*3/uL — ABNORMAL LOW (ref 0.7–3.1)
Lymphs: 10 %
MCH: 25.2 pg — ABNORMAL LOW (ref 26.6–33.0)
MCHC: 31.5 g/dL (ref 31.5–35.7)
MCV: 80 fL (ref 79–97)
Monocytes Absolute: 0.3 10*3/uL (ref 0.1–0.9)
Monocytes: 5 %
Neutrophils Absolute: 4.2 10*3/uL (ref 1.4–7.0)
Neutrophils: 81 %
Platelets: 233 10*3/uL (ref 150–450)
RBC: 4.61 x10E6/uL (ref 3.77–5.28)
RDW: 15.3 % (ref 11.7–15.4)
WBC: 5.2 10*3/uL (ref 3.4–10.8)

## 2020-12-28 LAB — LIPID PANEL
Chol/HDL Ratio: 2.6 ratio (ref 0.0–4.4)
Cholesterol, Total: 168 mg/dL (ref 100–199)
HDL: 65 mg/dL (ref >39)
LDL Chol Calc (NIH): 91 mg/dL (ref 0–99)
Triglycerides: 58 mg/dL (ref 0–149)
VLDL Cholesterol Cal: 12 mg/dL (ref 5–40)

## 2020-12-28 LAB — HEMOGLOBIN A1C
Est. average glucose Bld gHb Est-mCnc: 117 mg/dL
Hgb A1c MFr Bld: 5.7 % — ABNORMAL HIGH (ref 4.8–5.6)

## 2021-01-02 ENCOUNTER — Other Ambulatory Visit: Payer: Self-pay

## 2021-02-08 ENCOUNTER — Other Ambulatory Visit: Payer: Self-pay

## 2021-02-13 ENCOUNTER — Other Ambulatory Visit: Payer: Self-pay

## 2021-02-27 NOTE — Progress Notes (Signed)
Radiation Oncology         [phone] ________________________________  Name: Anne Harris MRN: [id number]  Date: 02/28/2021  DOB: 27-Oct-1960  Follow-Up Visit Note  CC: Gildardo Pounds, NP  Gildardo Pounds, NP    ICD-10-CM   1. Squamous cell carcinoma of cervix (HCC)  C53.9 Cytology - PAP    Cytology - PAP      Diagnosis: Stage IIB squamous cell carcinoma of the cervix   Interval Since Last Radiation: 11 months and 3 days   Radiation Treatment Dates: 12/28/2019 to 02/09/2020 followed by five HDR brachytherapy treatments from 02/27/2020 to 03/27/2020   Site: Cervix Technique: 3D Total Dose (Gy): 45/45 Dose per Fx (Gy): 1.8 Completed Fx: 25/25 Beam Energies: 6X, 10X, 15X   Site: Cervix boost Technique: 3D Total Dose (Gy): 9/9 Dose per Fx (Gy): 1.8 Completed Fx: 5/5 Beam Energies: 15X  Narrative:  The patient returns today for routine follow-up, she was last seen here for folow-up on 09/06/20. Since here last visit, the patient followed up with Dr. Berline Lopes on 12/11/20. During which time, the patient denied vaginal bleeding or discharge and reported using her vaginal dilator once or twice a week. The patient was otherwise noted to display no evidence of cancer recurrence on exam.       The patient has not received any additional imaging since she was last seen.     She denies any new issues.  She specifically denies any abdominal bloating pelvic pain or vaginal bleeding.  She denies any rectal bleeding or hematuria.  She is using her vaginal dilator approximately once a week and have recommended she increase this frequency.  She denies any spotting after using her dilator.  The patient's weight has been stable over the past 3 months with no further weight gain.                        Allergies:  is allergic to nsaids.  Meds: Current Outpatient Medications  Medication Sig Dispense Refill   acetaminophen (TYLENOL) 500 MG tablet Take 1,000 mg by mouth every 6  (six) hours as needed for moderate pain.     amLODipine (NORVASC) 5 MG tablet TAKE 1 TABLET(5 MG) BY MOUTH DAILY 90 tablet 1   No current facility-administered medications for this encounter.    Physical Findings: The patient is in no acute distress. Patient is alert and oriented.  height is 5\' 5"  (1.651 m) and weight is 254 lb (115.2 kg). Her temperature is 97.8 F (36.6 C). Her blood pressure is 145/83 (abnormal) and her pulse is 87. Her respiration is 20 and oxygen saturation is 100%. .  No significant changes. Lungs are clear to auscultation bilaterally. Heart has regular rate and rhythm. No palpable cervical, supraclavicular, or axillary adenopathy. Abdomen soft, non-tender, normal bowel sounds.  On pelvic examination the external genitalia unremarkable.  A speculum exam is performed.  There are no mucosal lesions noted in the vaginal vault.  The cervical os is flush with the vaginal apex.  A Pap smear is obtained of the cervical os region.  Some mild bleeding noted with Pap smear.  On bimanual and rectovaginal examination there are no pelvic masses shaded or nodularity.  Rectal sphincter tone normal.   Lab Findings: Lab Results  Component Value Date   WBC 5.2 12/27/2020   HGB 11.6 12/27/2020   HCT 36.8 12/27/2020   MCV 80 12/27/2020   PLT 233 12/27/2020  Radiographic Findings: No results found.  Impression:  Stage IIB squamous cell carcinoma of the cervix   No evidence of recurrence on clinical exam today, Pap smear pending.  Patient does not report any long-term effects from her high-dose radiation therapy and radiosensitizing chemotherapy.  Plan: Patient will follow up with Dr. Berline Lopes in early January.  She will follow-up in radiation oncology in early April 2023.   25 minutes of total time was spent for this patient encounter, including preparation, face-to-face counseling with the patient and coordination of care, physical exam, and documentation of the  encounter. ____________________________________  Blair Promise, PhD, MD   This document serves as a record of services personally performed by Gery Pray, MD. It was created on his behalf by Roney Mans, a trained medical scribe. The creation of this record is based on the scribe's personal observations and the provider's statements to them. This document has been checked and approved by the attending provider.

## 2021-02-28 ENCOUNTER — Ambulatory Visit
Admission: RE | Admit: 2021-02-28 | Discharge: 2021-02-28 | Disposition: A | Discharge status: Home / Self Care | Payer: Medicaid Other | Source: Ambulatory Visit | Attending: Radiation Oncology | Admitting: Radiation Oncology

## 2021-02-28 ENCOUNTER — Other Ambulatory Visit: Payer: Self-pay

## 2021-02-28 ENCOUNTER — Other Ambulatory Visit (HOSPITAL_COMMUNITY)
Admission: RE | Admit: 2021-02-28 | Discharge: 2021-02-28 | Disposition: A | Discharge status: Home / Self Care | Payer: Medicaid Other | Source: Ambulatory Visit | Attending: Radiation Oncology | Admitting: Radiation Oncology

## 2021-02-28 VITALS — BP 145/83 | HR 87 | Temp 97.8°F | Resp 20 | Ht 65.0 in | Wt 254.0 lb

## 2021-02-28 DIAGNOSIS — Z923 Personal history of irradiation: Secondary | ICD-10-CM | POA: Insufficient documentation

## 2021-02-28 DIAGNOSIS — Z79899 Other long term (current) drug therapy: Secondary | ICD-10-CM | POA: Diagnosis not present

## 2021-02-28 DIAGNOSIS — C539 Malignant neoplasm of cervix uteri, unspecified: Secondary | ICD-10-CM | POA: Diagnosis not present

## 2021-02-28 DIAGNOSIS — Z9221 Personal history of antineoplastic chemotherapy: Secondary | ICD-10-CM | POA: Insufficient documentation

## 2021-02-28 DIAGNOSIS — Z8541 Personal history of malignant neoplasm of cervix uteri: Secondary | ICD-10-CM | POA: Diagnosis present

## 2021-02-28 NOTE — Progress Notes (Signed)
Anne Harris is here today for follow up post radiation to the pelvic.   They completed external beam radiation on: 02/09/20 and HDR brachytherapy on 03/27/2020   Does the patient complain of any of the following:   Pain: Patient denies Abdominal bloating: Patient denies Diarrhea/Constipation:  Denies any issues Nausea/Vomiting: Patient denies Wt Readings from Last 3 Encounters:  02/28/21 254 lb (115.2 kg)  12/27/20 255 lb 12.8 oz (116 kg)  12/11/20 254 lb (115.2 kg)   Vaginal Discharge: Patient denies Blood in Urine or Stool: Patient denies Urinary Issues (dysuria/incomplete emptying/ incontinence/ increased frequency/urgency): Patient denies Does patient report using vaginal dilator 2-3 times a week and/or sexually active 2-3 weeks:  Reports she's using the dilators twice a week. Denies any pain or difficulty with insertion Post radiation skin changes:  Patient denies Last GYN-onc visit: Saw Dr. Jeral Pinch on 11/29/2020     Additional comments if applicable: Denies any new issues or concerns. Reports feeling fatigued when having to work overtime, but otherwise reports she's doing well

## 2021-03-05 ENCOUNTER — Telehealth: Payer: Self-pay | Admitting: Oncology

## 2021-03-05 LAB — CYTOLOGY - PAP
Comment: NEGATIVE
Diagnosis: UNDETERMINED — AB
High risk HPV: NEGATIVE

## 2021-03-05 NOTE — Telephone Encounter (Signed)
Called Anne Harris and advised her of the PAP results showing ASC-US, HPV negative and that the plan is for her to follow up with Dr. Berline Lopes in 6 months.  She verbalized understanding and agreement.

## 2021-03-06 ENCOUNTER — Telehealth: Payer: Self-pay | Admitting: Radiology

## 2021-03-06 NOTE — Telephone Encounter (Signed)
-----   Message from Gery Pray, MD sent at 03/06/2021 10:44 AM EDT ----- Anne Harris, please inform patient that her pap smear is ok.  Thanks, jk ----- Message ----- From: Interface, Lab In Three [facility] One Sent: 03/05/2021   1:53 PM EDT To: Gery Pray, MD

## 2021-03-06 NOTE — Telephone Encounter (Signed)
LMOM to notify patient that pap smear is ok per Dr Sondra Come.

## 2021-03-13 ENCOUNTER — Other Ambulatory Visit: Payer: Self-pay

## 2021-03-14 ENCOUNTER — Other Ambulatory Visit: Payer: Self-pay

## 2021-04-16 ENCOUNTER — Other Ambulatory Visit: Payer: Self-pay

## 2021-04-17 ENCOUNTER — Other Ambulatory Visit: Payer: Self-pay

## 2021-04-18 ENCOUNTER — Ambulatory Visit: Payer: Self-pay

## 2021-04-18 NOTE — Telephone Encounter (Signed)
Pt. Reports she started having tingling and swelling 2 days ago. No pain. States she is on her feet a lot with "my job." Ambulating without difficulty. Requesting to be worked in for an appointment. Please advise pt.     Answer Assessment - Initial Assessment Questions 1. SYMPTOM: "What is the main symptom you are concerned about?" (e.g., weakness, numbness)     Tingling right knee 2. ONSET: "When did this start?" (minutes, hours, days; while sleeping)     2 days ago 3. LAST NORMAL: "When was the last time you (the patient) were normal (no symptoms)?"     3 days ago 4. PATTERN "Does this come and go, or has it been constant since it started?"  "Is it present now?"     Comes and goes 5. CARDIAC SYMPTOMS: "Have you had any of the following symptoms: chest pain, difficulty breathing, palpitations?"     No 6. NEUROLOGIC SYMPTOMS: "Have you had any of the following symptoms: headache, dizziness, vision loss, double vision, changes in speech, unsteady on your feet?"     No 7. OTHER SYMPTOMS: "Do you have any other symptoms?"     Swelling 8. PREGNANCY: "Is there any chance you are pregnant?" "When was your last menstrual period?"     No  Protocols used: Neurologic Deficit-A-AH

## 2021-04-19 NOTE — Telephone Encounter (Signed)
Attempt to call patient  Anne Harris to return call for available appt options-   Monday at [place]- [phone] Tuesday- virtual apt. With [name]- [place]

## 2021-04-19 NOTE — Telephone Encounter (Signed)
Schedule for televisit on Tuesday 11-22. Thanks!

## 2021-04-23 ENCOUNTER — Encounter: Payer: Self-pay | Admitting: Nurse Practitioner

## 2021-04-23 ENCOUNTER — Ambulatory Visit: Payer: Medicaid Other | Attending: Nurse Practitioner | Admitting: Nurse Practitioner

## 2021-04-23 ENCOUNTER — Other Ambulatory Visit: Payer: Self-pay

## 2021-04-23 ENCOUNTER — Telehealth: Payer: Self-pay | Admitting: Nurse Practitioner

## 2021-04-23 DIAGNOSIS — M25561 Pain in right knee: Secondary | ICD-10-CM

## 2021-04-23 DIAGNOSIS — I10 Essential (primary) hypertension: Secondary | ICD-10-CM | POA: Diagnosis not present

## 2021-04-23 MED ORDER — DICLOFENAC SODIUM 1 % EX GEL
2.0000 g | Freq: Four times a day (QID) | CUTANEOUS | 1 refills | Status: AC
  Filled 2021-04-23: qty 200, 25d supply, fill #0

## 2021-04-23 MED ORDER — AMLODIPINE BESYLATE 10 MG PO TABS
ORAL_TABLET | ORAL | 0 refills | Status: DC
  Filled 2021-04-23: qty 90, 90d supply, fill #0

## 2021-04-23 NOTE — Progress Notes (Signed)
Virtual Visit via Telephone Note Due to national recommendations of social distancing due to [place] 19, telehealth visit is felt to be most appropriate for this patient at this time.  I discussed the limitations, risks, security and privacy concerns of performing an evaluation and management service by telephone and the availability of in person appointments. I also discussed with the patient that there may be a patient responsible charge related to this service. The patient expressed understanding and agreed to proceed.    I connected with Anne Harris on 04/23/21  at   1:30 PM EST  EDT by telephone and verified that I am speaking with the correct person using two identifiers.  Location of Patient: Private Residence   Location of Provider: [place] participating in Telemedicine visit: [name] FNP-BC [place]    History of Present Illness: Telemedicine visit for: Right knee pain  Onset of the symptoms was several weeks ago. Inciting event: none known. Current symptoms include  tingling and mild discomfort in the anterior knee . States it feels like her knee is falling asleep. She denies swelling, warmth, or pain. Symptoms last for a few minutes and go away on their own. Pain is aggravated by  nothing .  Patient states she  has had no prior knee problems however an xray of the right knee several years ago shows right knee effusion. Evaluation to date: none. Treatment to date: none.     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 Sondra Come   History of radiation therapy 12/28/2019-02/09/2020   Cervical IMRT   Dr Gery Pray    Hypertension    followed by pcp   Mediastinal adenopathy    Obesity, Class III, BMI 40-49.9 (morbid obesity) ([place])    Pancytopenia, acquired ([place])    Pulmonary nodules    bilateral   Wears glasses     Past Surgical History:  Procedure Laterality Date   BRONCHIAL BIOPSY  12/15/2019   Procedure: BRONCHIAL BIOPSIES;  Surgeon: Garner Nash, DO;  Location: [place] [facility];  Service: Pulmonary;;   BRONCHIAL NEEDLE ASPIRATION BIOPSY  12/15/2019   Procedure: BRONCHIAL NEEDLE ASPIRATION BIOPSIES;  Surgeon: Garner Nash, DO;  Location: [place] [facility];  Service: Pulmonary;;   BRONCHIAL WASHINGS  12/15/2019   Procedure: BRONCHIAL WASHINGS;  Surgeon: Garner Nash, DO;  Location: [place] [facility];  Service: Pulmonary;;   CERVICAL BIOPSY     ENDOBRONCHIAL ULTRASOUND N/A 12/15/2019   Procedure: ENDOBRONCHIAL ULTRASOUND;  Surgeon: Garner Nash, DO;  Location: [place];  Service: Pulmonary;  Laterality: N/A;   IR IMAGING GUIDED PORT INSERTION  12/22/2019   IR REMOVAL TUN ACCESS W/ PORT W/O FL MOD SED  06/14/2020   OPERATIVE ULTRASOUND N/A 02/27/2020   Procedure: OPERATIVE ULTRASOUND;  Surgeon: Gery Pray, MD;  Location: [facility];  Service: Urology;  Laterality: N/A;   OPERATIVE ULTRASOUND N/A 03/05/2020   Procedure: OPERATIVE ULTRASOUND;  Surgeon: Gery Pray, MD;  Location: [facility];  Service: Urology;  Laterality: N/A;   OPERATIVE ULTRASOUND N/A 03/14/2020   Procedure: OPERATIVE ULTRASOUND;  Surgeon: Gery Pray, MD;  Location: [facility];  Service: Urology;  Laterality: N/A;   OPERATIVE ULTRASOUND N/A 03/23/2020   Procedure: OPERATIVE ULTRASOUND;  Surgeon: Gery Pray, MD;  Location: [facility];  Service: Urology;  Laterality: N/A;   OPERATIVE ULTRASOUND N/A 03/27/2020   Procedure: OPERATIVE ULTRASOUND;  Surgeon: Gery Pray, MD;  Location: [facility];  Service: Urology;  Laterality: N/A;   TANDEM  RING INSERTION N/A 02/27/2020   Procedure: TANDEM RING INSERTION;  Surgeon: Gery Pray, MD;  Location: [facility];  Service: Urology;  Laterality: N/A;   TANDEM RING INSERTION N/A 03/05/2020   Procedure: TANDEM RING INSERTION;  Surgeon: Gery Pray, MD;  Location: [facility];  Service: Urology;  Laterality: N/A;   TANDEM RING INSERTION N/A 03/14/2020   Procedure: TANDEM RING INSERTION FOR HIGH DOSE RADIATION THERAPY;  Surgeon: Gery Pray, MD;  Location: [facility];  Service: Urology;  Laterality: N/A;   TANDEM RING INSERTION N/A 03/23/2020   Procedure: TANDEM RING INSERTION;  Surgeon: Gery Pray, MD;  Location: [facility];  Service: Urology;  Laterality: N/A;   TANDEM RING INSERTION N/A 03/27/2020   Procedure: TANDEM RING INSERTION;  Surgeon: Gery Pray, MD;  Location: [facility];  Service: Urology;  Laterality: N/A;  ULTRASOUND AND TECH    Family History  Problem Relation Age of Onset   Colon cancer Mother 57   Ovarian cancer Neg Hx    Uterine cancer Neg Hx    Breast cancer Neg Hx    Cervical cancer Neg Hx     Social History   Socioeconomic History   Marital status: Single    Spouse name: Not on file   Number of children: 6   Years of education: Not on file   Highest education level: High school graduate  Occupational History   Occupation: care giver  Tobacco Use   Smoking status: Never   Smokeless tobacco: Never  Vaping Use   Vaping Use: Never used  Substance and Sexual Activity   Alcohol use: Yes    Comment: social   Drug use: Never   Sexual activity: Not Currently  Other Topics Concern   Not on file  Social History Narrative   Not on file   Social Determinants of Health   Financial Resource Strain: Not on file  Food Insecurity: Not on file  Transportation Needs: Not on file  Physical Activity: Not on file  Stress: Not on file  Social Connections: Not on file      Observations/Objective: Awake, alert and oriented x 3   Review of Systems  Constitutional:  Negative for fever, malaise/fatigue and weight loss.  HENT: Negative.  Negative for nosebleeds.   Eyes: Negative.  Negative for blurred vision, double vision and photophobia.  Respiratory: Negative.  Negative for cough and shortness of breath.   Cardiovascular: Negative.  Negative for chest pain, palpitations and leg swelling.  Gastrointestinal: Negative.  Negative for heartburn, nausea and vomiting.  Musculoskeletal:  Positive for joint pain. Negative for myalgias.  Neurological: Negative.  Negative for dizziness, focal weakness, seizures and headaches.  Psychiatric/Behavioral: Negative.  Negative for suicidal ideas.    Assessment and Plan: Diagnoses and all orders for this visit:  Acute pain of right knee -     diclofenac Sodium (VOLTAREN) 1 % GEL; Apply 2 g topically 4 (four) times daily.  Recommend knee brace/sleeve and weight loss   HTN Blood pressure poorly controlled. She does not monitor at home but  reports readings at work are elevated.  Refilled amlodipine today at 10mg    Follow Up Instructions Return in about 3 months (around 07/24/2021).     I discussed the assessment and treatment plan with the patient. The patient was provided an opportunity to ask questions and all were answered. The patient agreed with the plan and demonstrated an understanding of the instructions.   The patient was advised to call back or seek an in-person evaluation if the symptoms worsen or if the condition fails to improve as anticipated.  I provided 10 minutes of non-face-to-face time during this encounter including median intraservice time, reviewing previous notes, labs, imaging, medications and explaining diagnosis and management.  Gildardo Pounds, FNP-BC

## 2021-04-23 NOTE — Telephone Encounter (Signed)
1st call at 115pm 2nd call at 130pm No answer LVM

## 2021-04-29 ENCOUNTER — Other Ambulatory Visit: Payer: Self-pay | Admitting: Nurse Practitioner

## 2021-04-29 ENCOUNTER — Other Ambulatory Visit: Payer: Self-pay

## 2021-04-29 ENCOUNTER — Ambulatory Visit: Payer: Medicaid Other | Attending: Nurse Practitioner

## 2021-04-29 DIAGNOSIS — I10 Essential (primary) hypertension: Secondary | ICD-10-CM

## 2021-04-30 LAB — CMP14+EGFR
ALT: 10 IU/L (ref 0–32)
AST: 14 IU/L (ref 0–40)
Albumin/Globulin Ratio: 1.7 (ref 1.2–2.2)
Albumin: 4.2 g/dL (ref 3.8–4.8)
Alkaline Phosphatase: 99 IU/L (ref 44–121)
BUN/Creatinine Ratio: 15 (ref 12–28)
BUN: 12 mg/dL (ref 8–27)
Bilirubin Total: 0.3 mg/dL (ref 0.0–1.2)
CO2: 22 mmol/L (ref 20–29)
Calcium: 9.3 mg/dL (ref 8.7–10.3)
Chloride: 105 mmol/L (ref 96–106)
Creatinine, Ser: 0.82 mg/dL (ref 0.57–1.00)
Globulin, Total: 2.5 g/dL (ref 1.5–4.5)
Glucose: 102 mg/dL — ABNORMAL HIGH (ref 70–99)
Potassium: 4.1 mmol/L (ref 3.5–5.2)
Sodium: 143 mmol/L (ref 134–144)
Total Protein: 6.7 g/dL (ref 6.0–8.5)
eGFR: 81 mL/min/{1.73_m2} (ref >59)

## 2021-06-06 ENCOUNTER — Telehealth: Payer: Self-pay | Admitting: *Deleted

## 2021-06-06 NOTE — Telephone Encounter (Signed)
Shirley from radiation called and scheduled the patient for a follow up appt. Appt scheduled and Enid Derry will contact the patient

## 2021-06-06 NOTE — Telephone Encounter (Signed)
CALLED PATIENT TO INFORM OF FU APPT. WITH DR. Berline Harris ON 06-27-21- ARRIVAL TIME- 1:45 PM, LVM FOR A RETURN CALL

## 2021-06-27 ENCOUNTER — Other Ambulatory Visit: Payer: Self-pay

## 2021-06-27 ENCOUNTER — Encounter: Payer: Self-pay | Admitting: Gynecologic Oncology

## 2021-06-27 ENCOUNTER — Inpatient Hospital Stay: Payer: Medicaid Other | Attending: Gynecologic Oncology | Admitting: Gynecologic Oncology

## 2021-06-27 VITALS — BP 148/97 | HR 84 | Temp 98.5°F | Resp 17 | Wt 263.2 lb

## 2021-06-27 DIAGNOSIS — Z923 Personal history of irradiation: Secondary | ICD-10-CM | POA: Insufficient documentation

## 2021-06-27 DIAGNOSIS — Z8541 Personal history of malignant neoplasm of cervix uteri: Secondary | ICD-10-CM | POA: Diagnosis not present

## 2021-06-27 DIAGNOSIS — Z9221 Personal history of antineoplastic chemotherapy: Secondary | ICD-10-CM | POA: Insufficient documentation

## 2021-06-27 DIAGNOSIS — C539 Malignant neoplasm of cervix uteri, unspecified: Secondary | ICD-10-CM

## 2021-06-27 DIAGNOSIS — I10 Essential (primary) hypertension: Secondary | ICD-10-CM | POA: Insufficient documentation

## 2021-06-27 DIAGNOSIS — Z79899 Other long term (current) drug therapy: Secondary | ICD-10-CM | POA: Insufficient documentation

## 2021-06-27 DIAGNOSIS — Z6841 Body Mass Index (BMI) 40.0 and over, adult: Secondary | ICD-10-CM | POA: Insufficient documentation

## 2021-06-27 NOTE — Patient Instructions (Signed)
It was good to see you today!  I do not see or feel any evidence of cancer on your exam.  I will see you back in 6 months.  My schedule is not out that far in advance, so please call back in late May or early June to schedule a visit with me in late July or early August.  You develop any of the symptoms that we discussed, such as vaginal bleeding or pelvic pain, please call to see me sooner.

## 2021-06-27 NOTE — Progress Notes (Signed)
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  On 6/10 she underwent Pap smear.  Last Pap smear had been 30 years prior and normal per patient.     11/21/2019 Pathology Results   A. CERVIX, BIOPSY:  -  Squamous cell carcinoma    11/29/2019 PET scan   1. Highly hypermetabolic cervical mass, with fluid distended endometrial cavity. Maximum SUV of the cervical mass is 25.8. 2. Left adnexal lesion versus more likely fundal fibroid, maximum SUV 6.0. Surveillance suggested. 3. There is low-grade activity in several abdominal lymph nodes including a borderline enlarged peripancreatic lymph node. However, there is striking activity in numerous enlarged thoracic lymph nodes, some of which have associated calcifications. It would be unusual to have this degree of thoracic adenopathy from cervical cancer without having a commensurate level of abdominopelvic adenopathy. This along with the calcifications of the lymph nodes raises the possibility of active granulomatous process (such as sarcoidosis) as a potential cause for the striking thoracic adenopathy. Concomitant lymphoma might also have a similar appearance. Sampling of thoracic lymph nodes may be helpful in differentiating metastatic disease other entities such as sarcoid or Lymphoma.  4. Mild pleural/subpleural nodularity in the lungs, merit surveillance. 5. Cholelithiasis.   11/30/2019 Cancer Staging   Staging form: Cervix Uteri, AJCC Version 9 - Clinical stage from 11/30/2019: FIGO Stage IIB (cT2b, cN0, cM0) - Signed by Heath Lark, MD on 11/30/2019    12/02/2019 Imaging   Ct abdomen and pelvis 1. Cervical soft  tissue mass as described in keeping with known malignancy. 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 2. Mildly rounded subcentimeter aortocaval and right pelvic sidewall lymph nodes. 3. A 5.3 x 3.9 cm left fundal exophytic soft tissue suboptimally characterized but possibly a fibroid. 4. Cholelithiasis. 5. No bowel obstruction. Normal appendix. 6. Small bilateral lung base subpleural nodules similar to prior PET CT. Clinical correlation and follow-up recommended.   12/22/2019 Procedure   Successful placement of a right internal jugular approach power injectable Port-A-Cath. The catheter is ready for immediate use.   12/28/2019 - 03/27/2020 Radiation Therapy   12/28/2019 to 02/09/2020 followed by five HDR brachytherapy treatments from 02/27/2020 to 03/27/2020   Site: Cervix Technique: 3D Total Dose (Gy): 45/45 Dose per Fx (Gy): 1.8 Completed Fx: 25/25 Beam Energies: 6X, 10X, 15X   Site: Cervix boost Technique: 3D Total Dose (Gy): 9/9 Dose per Fx (Gy): 1.8 Completed Fx: 5/5 Beam Energies: 15X   12/29/2019 - 01/27/2020 Chemotherapy   The patient had cisplatin for chemotherapy treatment.     05/22/2020 PET scan   1. Compared with the previous PET-CT of 6 months ago, the large hypermetabolic cervical mass has resolved consistent with response to therapy. 2. The previously demonstrated partially calcified mediastinal and hilar adenopathy has mildly improved. 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 3. Stable incidental findings including a probable exophytic fundal fibroid, cholelithiasis and periodontal disease.    Radiation Therapy   Pelvic radiation and HDR brachytherapy   06/15/2020 Procedure   Successful right IJ vein Port-A-Cath explant.     Interval History: Patient  reports overall  doing well.  She denies any vaginal bleeding or discharge.  Denies any abdominal or pelvic pain.  Reports regular bowel and bladder function.  Continues to use her vaginal dilator 3 times a week.  Began having right leg pain (tingling in her knee) several weeks ago.  Is seeing her PCP next week for this.  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 Sondra Come   History of radiation therapy 12/28/2019-02/09/2020   Cervical IMRT   Dr Gery Pray   Hypertension    followed by pcp   Mediastinal adenopathy    Obesity, Class III, BMI 40-49.9 (morbid obesity) ([place])    Pancytopenia, acquired ([place])    Pulmonary nodules    bilateral   Wears glasses     Past Surgical History:  Procedure Laterality Date   BRONCHIAL BIOPSY  12/15/2019   Procedure: BRONCHIAL BIOPSIES;  Surgeon: Garner Nash, DO;  Location: [place] [facility];  Service: Pulmonary;;   BRONCHIAL NEEDLE ASPIRATION BIOPSY  12/15/2019   Procedure: BRONCHIAL NEEDLE ASPIRATION BIOPSIES;  Surgeon: Garner Nash, DO;  Location: [place] [facility];  Service: Pulmonary;;   BRONCHIAL WASHINGS  12/15/2019   Procedure: BRONCHIAL WASHINGS;  Surgeon: Garner Nash, DO;  Location: [place] [facility];  Service: Pulmonary;;   CERVICAL BIOPSY     ENDOBRONCHIAL ULTRASOUND N/A 12/15/2019   Procedure: ENDOBRONCHIAL ULTRASOUND;  Surgeon: Garner Nash, DO;  Location: [place];  Service: Pulmonary;  Laterality: N/A;   IR IMAGING GUIDED PORT INSERTION  12/22/2019   IR REMOVAL TUN ACCESS W/ PORT W/O FL MOD SED  06/14/2020   OPERATIVE ULTRASOUND N/A 02/27/2020   Procedure: OPERATIVE ULTRASOUND;  Surgeon: Gery Pray, MD;  Location:  [facility];  Service: Urology;  Laterality: N/A;   OPERATIVE ULTRASOUND N/A 03/05/2020   Procedure: OPERATIVE ULTRASOUND;  Surgeon: Gery Pray, MD;  Location: [facility];  Service: Urology;  Laterality: N/A;   OPERATIVE ULTRASOUND N/A 03/14/2020   Procedure: OPERATIVE ULTRASOUND;  Surgeon: Gery Pray, MD;  Location: [facility];  Service: Urology;  Laterality: N/A;   OPERATIVE ULTRASOUND N/A 03/23/2020   Procedure: OPERATIVE ULTRASOUND;  Surgeon: Gery Pray, MD;  Location: [facility];  Service: Urology;  Laterality: N/A;   OPERATIVE ULTRASOUND N/A 03/27/2020   Procedure: OPERATIVE ULTRASOUND;  Surgeon: Gery Pray, MD;  Location: [facility];  Service: Urology;  Laterality: N/A;   TANDEM RING INSERTION N/A 02/27/2020   Procedure: TANDEM RING INSERTION;  Surgeon: Gery Pray, MD;  Location: [facility];  Service: Urology;  Laterality: N/A;   TANDEM RING INSERTION N/A 03/05/2020   Procedure: TANDEM RING INSERTION;  Surgeon: Gery Pray, MD;  Location: [facility];  Service: Urology;  Laterality: N/A;   TANDEM RING INSERTION N/A 03/14/2020   Procedure: TANDEM RING INSERTION FOR HIGH DOSE RADIATION THERAPY;  Surgeon: Gery Pray, MD;  Location: [facility];  Service: Urology;  Laterality: N/A;   TANDEM RING INSERTION N/A 03/23/2020   Procedure: TANDEM RING INSERTION;  Surgeon: Gery Pray, MD;  Location: [facility];  Service: Urology;  Laterality: N/A;   TANDEM RING  INSERTION N/A 03/27/2020   Procedure: TANDEM RING INSERTION;  Surgeon: Gery Pray, MD;  Location: [facility];  Service: Urology;  Laterality: N/A;  ULTRASOUND AND TECH    Family History  Problem Relation Age of Onset   Colon cancer Mother 54   Ovarian cancer Neg Hx    Uterine cancer Neg Hx    Breast cancer Neg Hx    Cervical cancer Neg Hx     Social History    Socioeconomic History   Marital status: Single    Spouse name: Not on file   Number of children: 6   Years of education: Not on file   Highest education level: High school graduate  Occupational History   Occupation: care giver  Tobacco Use   Smoking status: Never   Smokeless tobacco: Never  Vaping Use   Vaping Use: Never used  Substance and Sexual Activity   Alcohol use: Yes    Comment: social   Drug use: Never   Sexual activity: Not Currently  Other Topics Concern   Not on file  Social History Narrative   Not on file   Social Determinants of Health   Financial Resource Strain: Not on file  Food Insecurity: Not on file  Transportation Needs: Not on file  Physical Activity: Not on file  Stress: Not on file  Social Connections: Not on file    Current Medications:  Current Outpatient Medications:    acetaminophen (TYLENOL) 500 MG tablet, Take 1,000 mg by mouth every 6 (six) hours as needed for moderate pain., Disp: , Rfl:    amLODipine (NORVASC) 10 MG tablet, TAKE 1 TABLET(5 MG) BY MOUTH DAILY, Disp: 90 tablet, Rfl: 0  Review of Systems: Denies appetite changes, fevers, chills, fatigue, unexplained weight changes. Denies hearing loss, neck lumps or masses, mouth sores, ringing in ears or voice changes. Denies cough or wheezing.  Denies shortness of breath. Denies chest pain or palpitations. Denies leg swelling. Denies abdominal distention, pain, blood in stools, constipation, diarrhea, nausea, vomiting, or early satiety. Denies pain with intercourse, dysuria, frequency, hematuria or incontinence. Denies hot flashes, pelvic pain, vaginal bleeding or vaginal discharge.   Denies joint pain, back pain or muscle pain/cramps. Denies itching, rash, or wounds. Denies dizziness, headaches, numbness or seizures. Denies swollen lymph nodes or glands, denies easy bruising or bleeding. Denies anxiety, depression, confusion, or decreased concentration.  Physical Exam: BP (!)  148/97 (BP Location: Left Arm, Patient Position: Sitting)    Pulse 84    Temp 98.5 F (36.9 C) (Oral)    Resp 17    Wt 263 lb 3 oz (119.4 kg)    SpO2 100%    BMI 43.80 kg/m  General: Alert, oriented, no acute distress. HEENT: Normocephalic, atraumatic, sclera anicteric. Chest: Clear to auscultation bilaterally.  No wheezes or rhonchi. Cardiovascular: Regular rate and rhythm, no murmurs. Abdomen: Obese, soft, nontender.  Normoactive bowel sounds.  No masses or hepatosplenomegaly appreciated.   Extremities: Grossly normal range of motion.  Warm, well perfused.  No edema bilaterally. Skin: No rashes or lesions noted. Lymphatics: No cervical, supraclavicular, or inguinal adenopathy. GU: Normal appearing external genitalia without erythema, excoriation, or lesions.  Speculum exam reveals atrophic vaginal mucosa, radiation changes present.  No lesions on the cervix, no bleeding or discharge.  Bimanual exam reveals cervix smooth, flush with vaginal apex, no nodularity.  Rectovaginal exam confirms findings.  Laboratory & Radiologic Studies: Pap 01/2021 - ASCUS, HR HPV negative  Assessment & Plan: Anne Harris  is a 61 y.o. woman with Stage IIB SCC of the cervix who presents for surveillance visit. Tentative chemoradiation completed in 03/2020.   The patient is doing very well and is NED on exam today.    She has continued to gain weight and is up since her last visit.  She describes making poor food choices but would like to work on starting to lose weight.    Congratulated her on frequent vaginal dilator use and urged her to continue with this.   Per NCCN surveillance recommendations, we will continue visits every 3 months alternating between my office and radiation oncology.  She will get yearly Pap test, which she will be due for at her October.  I will plan to see her back in 6 months.  She was asked to call the office in late May or June to schedule a visit with me in late July.  We  discussed signs and symptoms that would be concerning for disease recurrence and she knows to call if she develops any of these.  32 minutes of total time was spent for this patient encounter, including preparation, face-to-face counseling with the patient and coordination of care, and documentation of the encounter.  Jeral Pinch, MD  Division of Gynecologic Oncology  Department of Obstetrics and Gynecology  [facility]

## 2021-07-01 ENCOUNTER — Encounter: Payer: Self-pay | Admitting: Nurse Practitioner

## 2021-07-01 ENCOUNTER — Ambulatory Visit: Payer: Medicaid Other | Attending: Nurse Practitioner | Admitting: Nurse Practitioner

## 2021-07-01 ENCOUNTER — Other Ambulatory Visit: Payer: Self-pay

## 2021-07-01 VITALS — BP 132/86 | HR 68 | Ht 65.0 in | Wt 265.1 lb

## 2021-07-01 DIAGNOSIS — I10 Essential (primary) hypertension: Secondary | ICD-10-CM

## 2021-07-01 DIAGNOSIS — Z1159 Encounter for screening for other viral diseases: Secondary | ICD-10-CM | POA: Diagnosis not present

## 2021-07-01 DIAGNOSIS — M25561 Pain in right knee: Secondary | ICD-10-CM | POA: Diagnosis not present

## 2021-07-01 DIAGNOSIS — Z1231 Encounter for screening mammogram for malignant neoplasm of breast: Secondary | ICD-10-CM

## 2021-07-01 DIAGNOSIS — Z1211 Encounter for screening for malignant neoplasm of colon: Secondary | ICD-10-CM

## 2021-07-01 DIAGNOSIS — R7303 Prediabetes: Secondary | ICD-10-CM | POA: Diagnosis not present

## 2021-07-01 DIAGNOSIS — G8929 Other chronic pain: Secondary | ICD-10-CM

## 2021-07-01 MED ORDER — BLOOD PRESSURE MONITOR DEVI: 0 refills | Status: DC

## 2021-07-01 MED ORDER — GABAPENTIN 300 MG PO CAPS
300.0000 mg | ORAL_CAPSULE | Freq: Every day | ORAL | 3 refills | Status: DC
  Filled 2021-07-01: qty 30, 30d supply, fill #0
  Filled 2021-07-31: qty 30, 30d supply, fill #1
  Filled 2021-08-30: qty 30, 30d supply, fill #2
  Filled 2021-10-03: qty 30, 30d supply, fill #3
  Filled 2021-11-04: qty 30, 30d supply, fill #4

## 2021-07-01 NOTE — Progress Notes (Signed)
Assessment & Plan:  [name] was seen today for knee pain.  Diagnoses and all orders for this visit:  Primary hypertension Continue all antihypertensives as prescribed.  Remember to bring in your blood pressure log with you for your follow up appointment.  DASH/Mediterranean Diets are healthier choices for HTN.    Chronic pain of right knee -     gabapentin (NEURONTIN) 300 MG capsule; Take 1 capsule (300 mg total) by mouth at bedtime. -     DG Knee Complete 4 Views Right; Future Needs to work on significant weight loss.  We had a long discussion regarding dietary modifications.  Current BMI is 44 and likely contributing to her knee pain.  Prediabetes -     Hemoglobin A1c  Need for hepatitis C screening test -     HCV Ab w Reflex to Quant PCR  Colon cancer screening -     Ambulatory referral to Gastroenterology  Breast cancer screening by mammogram -     MS DIGITAL SCREENING TOMO BILATERAL; Future -     US BREAST LTD UNI LEFT INC AXILLA; Future  Morbid (severe) obesity due to excess calories (HCC) Discussed diet and exercise for person with BMI >40. Instructed: You must burn more calories than you eat. Losing 5 percent of your body weight should be considered a success. In the longer term, losing more than 15 percent of your body weight and staying at this weight is an extremely good result. However, keep in mind that even losing 5 percent of your body weight leads to important health benefits, so try not to get discouraged if you're not able to lose more than this.     Patient has been counseled on age-appropriate routine health concerns for screening and prevention. These are reviewed and up-to-date. Referrals have been placed accordingly. Immunizations are up-to-date or declined.    Subjective:   Chief Complaint  Patient presents with   Knee Pain   HPI Anne Harris 61 y.o. female presents to office today for follow-up to chronic right knee pain and  hypertension. She has a past medical history of Abnormal Pap smear of cervix, Anemia, Cervical cancer (oncologist-- dr Alvy Bimler and dr Sondra Come), History of radiation therapy (02/27/2020-03/27/2020), History of radiation therapy (12/28/2019-02/09/2020), Hypertension, Mediastinal adenopathy, Obesity, Class III, BMI 40-49.9 currently pancytopenia, acquired, Pulmonary nodules, and Wears glasses.   HTN Blood pressure is well controlled with amlodipine 10 mg daily.  She does not have a monitoring device to check her blood pressure at home.  At this time as she has Medicaid we will order her a blood pressure machine.  BP Readings from Last 3 Encounters:  07/01/21 132/86  06/27/21 (!) 148/97  02/28/21 (!) 145/83     Knee Pain Onset of the symptoms was a few months ago. Inciting event: none known. Current symptoms include tingling and mild discomfort in the anterior knee . States it feels like her knee is falling asleep. She denies swelling, warmth, or pain. Symptoms last for a few minutes and go away on their own. Pain is aggravated by  nothing .  Patient states she  has had no prior knee problems however an xray of the right knee several years ago shows right knee effusion. Evaluation to date: none. Treatment to date: diclofenac with no relief of symptoms.   Prediabetes Currently controlled with diet only.  Lab Results  Component Value Date   HGBA1C 5.7 (H) 12/27/2020     Review of Systems  Constitutional:  Negative for fever, malaise/fatigue and weight loss.  HENT: Negative.  Negative for nosebleeds.   Eyes: Negative.  Negative for blurred vision, double vision and photophobia.  Respiratory: Negative.  Negative for cough and shortness of breath.   Cardiovascular: Negative.  Negative for chest pain, palpitations and leg swelling.  Gastrointestinal: Negative.  Negative for heartburn, nausea and vomiting.  Musculoskeletal:  Positive for joint pain. Negative for myalgias.  Neurological: Negative.   Negative for dizziness, focal weakness, seizures and headaches.  Psychiatric/Behavioral: Negative.  Negative for suicidal ideas.    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 Sondra Come   History of radiation therapy 12/28/2019-02/09/2020   Cervical IMRT   Dr Gery Pray   Hypertension    followed by pcp   Mediastinal adenopathy    Obesity, Class III, BMI 40-49.9 (morbid obesity) ([place])    Pancytopenia, acquired ([place])    Pulmonary nodules    bilateral   Wears glasses     Past Surgical History:  Procedure Laterality Date   BRONCHIAL BIOPSY  12/15/2019   Procedure: BRONCHIAL BIOPSIES;  Surgeon: Garner Nash, DO;  Location: [facility];  Service: Pulmonary;;   BRONCHIAL NEEDLE ASPIRATION BIOPSY  12/15/2019   Procedure: BRONCHIAL NEEDLE ASPIRATION BIOPSIES;  Surgeon: Garner Nash, DO;  Location: [place] [facility];  Service: Pulmonary;;   BRONCHIAL WASHINGS  12/15/2019   Procedure: BRONCHIAL WASHINGS;  Surgeon: Garner Nash, DO;  Location: [facility];  Service: Pulmonary;;   CERVICAL BIOPSY     ENDOBRONCHIAL ULTRASOUND N/A 12/15/2019   Procedure: ENDOBRONCHIAL ULTRASOUND;  Surgeon: Garner Nash, DO;  Location: [place];  Service: Pulmonary;  Laterality: N/A;   IR IMAGING GUIDED PORT INSERTION  12/22/2019   IR REMOVAL TUN ACCESS W/ PORT W/O FL MOD SED  06/14/2020   OPERATIVE ULTRASOUND N/A 02/27/2020   Procedure: OPERATIVE ULTRASOUND;  Surgeon: Gery Pray, MD;  Location: [facility];  Service: Urology;  Laterality: N/A;   OPERATIVE ULTRASOUND N/A 03/05/2020   Procedure: OPERATIVE ULTRASOUND;  Surgeon: Gery Pray, MD;  Location: [facility];   Service: Urology;  Laterality: N/A;   OPERATIVE ULTRASOUND N/A 03/14/2020   Procedure: OPERATIVE ULTRASOUND;  Surgeon: Gery Pray, MD;  Location: [facility];  Service: Urology;  Laterality: N/A;   OPERATIVE ULTRASOUND N/A 03/23/2020   Procedure: OPERATIVE ULTRASOUND;  Surgeon: Gery Pray, MD;  Location: [facility];  Service: Urology;  Laterality: N/A;   OPERATIVE ULTRASOUND N/A 03/27/2020   Procedure: OPERATIVE ULTRASOUND;  Surgeon: Gery Pray, MD;  Location: [facility];  Service: Urology;  Laterality: N/A;   TANDEM RING INSERTION N/A 02/27/2020   Procedure: TANDEM RING INSERTION;  Surgeon: Gery Pray, MD;  Location: [facility];  Service: Urology;  Laterality: N/A;   TANDEM RING INSERTION N/A 03/05/2020   Procedure: TANDEM RING INSERTION;  Surgeon: Gery Pray, MD;  Location: [facility];  Service: Urology;  Laterality: N/A;   TANDEM RING INSERTION N/A 03/14/2020   Procedure: TANDEM RING INSERTION FOR HIGH DOSE RADIATION THERAPY;  Surgeon: Gery Pray, MD;  Location: [facility];  Service: Urology;  Laterality: N/A;   TANDEM  RING INSERTION N/A 03/23/2020   Procedure: TANDEM RING INSERTION;  Surgeon: Gery Pray, MD;  Location: [facility];  Service: Urology;  Laterality: N/A;   TANDEM RING INSERTION N/A 03/27/2020   Procedure: TANDEM RING INSERTION;  Surgeon: Gery Pray, MD;  Location: [facility];  Service: Urology;  Laterality: N/A;  ULTRASOUND AND TECH    Family History  Problem Relation Age of Onset   Colon cancer Mother 79   Ovarian cancer Neg Hx    Uterine cancer Neg Hx    Breast cancer Neg Hx    Cervical cancer Neg Hx     Social History Reviewed with no changes to be made today.   Outpatient Medications Prior to Visit  Medication Sig Dispense Refill   acetaminophen (TYLENOL) 500 MG tablet Take 1,000 mg by mouth every 6 (six) hours as  needed for moderate pain.     amLODipine (NORVASC) 10 MG tablet TAKE 1 TABLET(5 MG) BY MOUTH DAILY 90 tablet 0   No facility-administered medications prior to visit.    Allergies  Allergen Reactions   Nsaids     Not recommended per oncology due to HTN and potential for ARI       Objective:    BP 132/86    Pulse 68    Ht 5\' 5"  (1.651 m)    Wt 265 lb 2 oz (120.3 kg)    SpO2 100%    BMI 44.12 kg/m  Wt Readings from Last 3 Encounters:  07/01/21 265 lb 2 oz (120.3 kg)  06/27/21 263 lb 3 oz (119.4 kg)  02/28/21 254 lb (115.2 kg)    Physical Exam Vitals and nursing note reviewed.  Constitutional:      Appearance: She is well-developed.  HENT:     Head: Normocephalic and atraumatic.  Cardiovascular:     Rate and Rhythm: Normal rate and regular rhythm.     Heart sounds: Normal heart sounds. No murmur heard.   No friction rub. No gallop.  Pulmonary:     Effort: Pulmonary effort is normal. No tachypnea or respiratory distress.     Breath sounds: Normal breath sounds. No decreased breath sounds, wheezing, rhonchi or rales.  Chest:     Chest wall: No tenderness.  Abdominal:     General: Bowel sounds are normal.     Palpations: Abdomen is soft.  Musculoskeletal:        General: Normal range of motion.     Cervical back: Normal range of motion.     Right knee: Normal.     Left knee: Normal.  Skin:    General: Skin is warm and dry.  Neurological:     Mental Status: She is alert and oriented to person, place, and time.     Coordination: Coordination normal.  Psychiatric:        Behavior: Behavior normal. Behavior is cooperative.        Thought Content: Thought content normal.        Judgment: Judgment normal.         Patient has been counseled extensively about nutrition and exercise as well as the importance of adherence with medications and regular follow-up. The patient was given clear instructions to go to ER or return to medical center if symptoms don't improve, worsen  or new problems develop. The patient verbalized understanding.   Follow-up: Return in about 3 months (around 09/29/2021) for HTN.   Gildardo Pounds, FNP-BC [place], [place]   07/01/2021, 2:50 PM

## 2021-07-02 LAB — HEMOGLOBIN A1C
Est. average glucose Bld gHb Est-mCnc: 117 mg/dL
Hgb A1c MFr Bld: 5.7 % — ABNORMAL HIGH (ref 4.8–5.6)

## 2021-07-02 LAB — HCV AB W REFLEX TO QUANT PCR: HCV Ab: <0.1 s/co ratio (ref 0.0–0.9)

## 2021-07-02 LAB — HCV INTERPRETATION

## 2021-07-31 ENCOUNTER — Other Ambulatory Visit: Payer: Self-pay

## 2021-07-31 ENCOUNTER — Other Ambulatory Visit: Payer: Self-pay | Admitting: Nurse Practitioner

## 2021-07-31 DIAGNOSIS — I10 Essential (primary) hypertension: Secondary | ICD-10-CM

## 2021-07-31 MED ORDER — AMLODIPINE BESYLATE 10 MG PO TABS
ORAL_TABLET | ORAL | 2 refills | Status: DC
  Filled 2021-07-31: qty 30, 30d supply, fill #0
  Filled 2021-08-30: qty 30, 30d supply, fill #1
  Filled 2021-10-03: qty 30, 30d supply, fill #2

## 2021-08-01 ENCOUNTER — Other Ambulatory Visit: Payer: Self-pay

## 2021-08-30 ENCOUNTER — Other Ambulatory Visit: Payer: Self-pay

## 2021-08-30 ENCOUNTER — Encounter: Payer: Self-pay | Admitting: Radiology

## 2021-09-03 ENCOUNTER — Telehealth: Payer: Self-pay | Admitting: *Deleted

## 2021-09-03 NOTE — Telephone Encounter (Signed)
Called patient to ask about rescheduling fu for 09-05-21, lvm for a return call ?

## 2021-09-04 ENCOUNTER — Other Ambulatory Visit: Payer: Self-pay

## 2021-09-05 ENCOUNTER — Ambulatory Visit: Payer: Medicaid Other | Admitting: Radiation Oncology

## 2021-09-11 NOTE — Progress Notes (Incomplete)
?Radiation Oncology         [phone] ?________________________________ ? ?Name: Anne Harris MRN: [id number]  ?Date: 09/12/2021  DOB: 03/14/1961 ? ?Follow-Up Visit Note ? ?CC: Gildardo Pounds, NP  Gildardo Pounds, NP ? ?No diagnosis found. ? 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 ?Interval Since Last Radiation: 1 year, 5 months, and 18 days  ? ?Radiation Treatment Dates: 12/28/2019 to 02/09/2020 followed by five HDR brachytherapy treatments from 02/27/2020 to 03/27/2020 ?  ?Site: Cervix ?Technique: 3D ?Total Dose (Gy): 45/45 ?Dose per Fx (Gy): 1.8 ?Completed Fx: 25/25 ?Beam Energies: 6X, 10X, 15X ?  ?Site: Cervix boost ?Technique: 3D ?Total Dose (Gy): 9/9 ?Dose per Fx (Gy): 1.8 ?Completed Fx: 5/5 ?Beam Energies: 15X ? ?Narrative:  The patient returns today for routine follow-up, she was last seen here for follow up on 02/28/21. Since her last visit, the patient followed up with Dr. Berline Lopes on 06/27/21. During which time, the patient denied any symptoms concerning for disease recurrence and was noted as NED on examination. She also reported using her vaginal dilator 3 times per week.       ? ?Otherwise, no significant interval history since the patient was last seen.       ? ?***                  ? ?Allergies:  is allergic to nsaids. ? ?Meds: ?Current Outpatient Medications  ?Medication Sig Dispense Refill  ? acetaminophen (TYLENOL) 500 MG tablet Take 1,000 mg by mouth every 6 (six) hours as needed for moderate pain.    ? amLODipine (NORVASC) 10 MG tablet TAKE 1 TABLET(5 MG) BY MOUTH DAILY 30 tablet 2  ? Blood Pressure Monitor DEVI Please provide patient with insurance approved blood pressure monitor 1 each 0  ? gabapentin (NEURONTIN) 300 MG capsule Take 1 capsule (300 mg total) by mouth at bedtime. 90 capsule 3  ? ?No current facility-administered medications for this encounter.  ? ? ?Physical Findings: ?The patient is in no acute distress. Patient is alert and oriented. ? vitals  were not taken for this visit. .  No significant changes. Lungs are clear to auscultation bilaterally. Heart has regular rate and rhythm. No palpable cervical, supraclavicular, or axillary adenopathy. Abdomen soft, non-tender, normal bowel sounds. ? ?On pelvic examination the external genitalia were unremarkable. A speculum exam was performed. There are no mucosal lesions noted in the vaginal vault. A Pap smear was obtained of the proximal vagina. On bimanual and rectovaginal examination there were no pelvic masses appreciated. *** ? ? ?Lab Findings: ?Lab Results  ?Component Value Date  ? WBC 5.2 12/27/2020  ? HGB 11.6 12/27/2020  ? HCT 36.8 12/27/2020  ? MCV 80 12/27/2020  ? PLT 233 12/27/2020  ? ? ?Radiographic Findings: ?No results found. ? 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 ?The patient is recovering from the effects of radiation.  *** ? ?Plan:  *** ? ? ?*** minutes of total time was spent for this patient encounter, including preparation, face-to-face counseling with the patient and coordination of care, physical exam, and documentation of the encounter. ?____________________________________ ? ?Blair Promise, PhD, MD ? ?This document serves as a record of services personally performed by Gery Pray, MD. It was created on his behalf by Roney Mans, a trained medical scribe. The creation of this record is based on the scribe's personal observations and the provider's statements to them. This document  has been checked and approved by the attending provider. ? ?

## 2021-09-12 ENCOUNTER — Ambulatory Visit
Admission: RE | Admit: 2021-09-12 | Discharge: 2021-09-12 | Disposition: A | Discharge status: Home / Self Care | Payer: Medicaid Other | Source: Ambulatory Visit | Attending: Radiation Oncology | Admitting: Radiation Oncology

## 2021-09-12 ENCOUNTER — Telehealth: Payer: Self-pay | Admitting: *Deleted

## 2021-09-12 NOTE — Telephone Encounter (Signed)
CALLED PATIENT TO ASK ABOUT RESCHEDULING FU APPT. FOR 09-12-21, RESCHEDULED FOR 09-30-21 @ 10:15 AM, LVM FOR A RETURN CALL ?

## 2021-09-28 NOTE — Progress Notes (Signed)
?Radiation Oncology         [phone] ?________________________________ ? ?Name: Anne Harris MRN: [id number]  ?Date: 09/30/2021  DOB: February 25, 1961 ? ?Follow-Up Visit Note ? ?CC: Gildardo Pounds, NP  Gildardo Pounds, NP ? ?  ICD-10-CM   ?1. Squamous cell carcinoma of cervix (HCC)  C53.9   ?  ? ? 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 ?Interval Since Last Radiation: 1 year, 6 months, and 5 days  ? ?Radiation Treatment Dates: 12/28/2019 to 02/09/2020 followed by five HDR brachytherapy treatments from 02/27/2020 to 03/27/2020 ?  ?Site: Cervix ?Technique: 3D ?Total Dose (Gy): 45/45 ?Dose per Fx (Gy): 1.8 ?Completed Fx: 25/25 ?Beam Energies: 6X, 10X, 15X ?  ?Site: Cervix boost ?Technique: 3D ?Total Dose (Gy): 9/9 ?Dose per Fx (Gy): 1.8 ?Completed Fx: 5/5 ?Beam Energies: 15X ? ?Narrative:  The patient returns today for routine follow-up, she was last seen here for follow up on 02/28/21. Since her last visit, the patient followed up with Dr. Berline Lopes on 06/27/21. During which time, the patient denied any symptoms concerning for disease recurrence and was noted as NED on examination. She also reported using her vaginal dilator 3 times per week.       ? ?Otherwise, no significant interval history since the patient was last seen.       ? ?She reports using her vaginal dilator 3 times a week as recommended.  She denies any bleeding after using the dilator.  She denies any abdominal bloating pelvic pain, vaginal bleeding, hematuria or rectal bleeding.  She denies any nausea.  She continues to work full-time.                  ? ?Allergies:  is allergic to nsaids. ? ?Meds: ?Current Outpatient Medications  ?Medication Sig Dispense Refill  ? acetaminophen (TYLENOL) 500 MG tablet Take 1,000 mg by mouth every 6 (six) hours as needed for moderate pain.    ? amLODipine (NORVASC) 10 MG tablet TAKE 1 TABLET(5 MG) BY MOUTH DAILY 30 tablet 2  ? Blood Pressure Monitor DEVI Please provide patient with  insurance approved blood pressure monitor 1 each 0  ? gabapentin (NEURONTIN) 300 MG capsule Take 1 capsule (300 mg total) by mouth at bedtime. 90 capsule 3  ? ?No current facility-administered medications for this encounter.  ? ? ?Physical Findings: ?The patient is in no acute distress. Patient is alert and oriented. ? height is '5\' 5"'$  (1.651 m) and weight is 247 lb 8 oz (112.3 kg). Her temporal temperature is 96.8 ?F (36 ?C) (abnormal). Her blood pressure is 147/81 (abnormal) and her pulse is 84. Her respiration is 18 and oxygen saturation is 100%. .   Lungs are clear to auscultation bilaterally. Heart has regular rate and rhythm. No palpable cervical, supraclavicular, or axillary adenopathy. Abdomen soft, non-tender, normal bowel sounds. ? ?On pelvic examination the external genitalia were unremarkable. A speculum exam was performed. There are no mucosal lesions noted in the vaginal vault.  On bimanual and rectovaginal examination there were no pelvic masses appreciated.  The cervical os is flush with the upper vaginal region.  Minimal radiation changes.  Rectal sphincter tone good. ? ? ?Lab Findings: ?Lab Results  ?Component Value Date  ? WBC 5.2 12/27/2020  ? HGB 11.6 12/27/2020  ? HCT 36.8 12/27/2020  ? MCV 80 12/27/2020  ? PLT 233 12/27/2020  ? ? ?Radiographic Findings: ?No results found. ? ?Impression:  Stage IIB squamous cell  carcinoma of the cervix  ? ?No evidence of recurrence on clinical exam today.  She does not appear to be exhibiting any long-term effects from her radiation and chemotherapy. ? ?She did see her primary care physician concerning weight loss issues.  Recommendations were for her to reduce salt and sugar.  She reports losing approximately 20 pounds with this dietary change. ? ?Plan: She will follow-up with Dr. Berline Lopes in 3 months.  Routine follow-up in radiation oncology in 6 months.  She is due for Pap smear in the fall. ? ? ?25 minutes of total time was spent for this patient encounter,  including preparation, face-to-face counseling with the patient and coordination of care, physical exam, and documentation of the encounter. ?____________________________________ ? ?Blair Promise, PhD, MD ? ?This document serves as a record of services personally performed by Gery Pray, MD. It was created on his behalf by Roney Mans, a trained medical scribe. The creation of this record is based on the scribe's personal observations and the provider's statements to them. This document has been checked and approved by the attending provider. ? ?

## 2021-09-30 ENCOUNTER — Encounter: Payer: Self-pay | Admitting: Radiation Oncology

## 2021-09-30 ENCOUNTER — Other Ambulatory Visit: Payer: Self-pay

## 2021-09-30 ENCOUNTER — Ambulatory Visit
Admission: RE | Admit: 2021-09-30 | Discharge: 2021-09-30 | Disposition: A | Discharge status: Home / Self Care | Payer: Medicaid Other | Source: Ambulatory Visit | Attending: Radiation Oncology | Admitting: Radiation Oncology

## 2021-09-30 DIAGNOSIS — Z8541 Personal history of malignant neoplasm of cervix uteri: Secondary | ICD-10-CM | POA: Diagnosis present

## 2021-09-30 DIAGNOSIS — Z923 Personal history of irradiation: Secondary | ICD-10-CM | POA: Insufficient documentation

## 2021-09-30 DIAGNOSIS — C539 Malignant neoplasm of cervix uteri, unspecified: Secondary | ICD-10-CM

## 2021-09-30 DIAGNOSIS — Z79899 Other long term (current) drug therapy: Secondary | ICD-10-CM | POA: Diagnosis not present

## 2021-09-30 NOTE — Progress Notes (Signed)
Anne Harris is here today for follow up post radiation to the pelvic. ? ?They completed their radiation on: 03/27/20 ? ?Does the patient complain of any of the following: ? ?Pain: No ?Abdominal bloating: No ?Diarrhea/Constipation: No ?Nausea/Vomiting: No ?Vaginal Discharge: No ?Blood in Urine or Stool: No ?Urinary Issues (dysuria/incomplete emptying/ incontinence/ increased frequency/urgency): No ?Does patient report using vaginal dilator 2-3 times a week and/or sexually active 2-3 weeks: Yes ?Post radiation skin changes: No ? ? ?Additional comments if applicable: ?  ?Vitals:  ? 09/30/21 1002  ?BP: (!) 147/81  ?Pulse: 84  ?Resp: 18  ?Temp: (!) 96.8 ?F (36 ?C)  ?TempSrc: Temporal  ?SpO2: 100%  ?Weight: 247 lb 8 oz (112.3 kg)  ?Height: '5\' 5"'$  (1.651 m)  ?  ?

## 2021-10-03 ENCOUNTER — Other Ambulatory Visit: Payer: Self-pay

## 2021-10-22 ENCOUNTER — Telehealth: Payer: Self-pay | Admitting: *Deleted

## 2021-10-22 ENCOUNTER — Telehealth: Payer: Self-pay

## 2021-10-22 NOTE — Telephone Encounter (Signed)
Called patient to inform of fu appt. with Dr. Berline Lopes on 01-02-22 - arrival time- 1:00 pm, lvm for a return call

## 2021-10-22 NOTE — Telephone Encounter (Signed)
Correction to last phone note, appt. with Dr. Berline Lopes is on 01-03-22- arrival time- 1 pm, lvm for a return call

## 2021-10-22 NOTE — Telephone Encounter (Signed)
Follow up appointment made with Dr.Tucker on 01/03/22'@1'$ :30. [place] (RAD-ONC) to notify pt

## 2021-11-04 ENCOUNTER — Other Ambulatory Visit: Payer: Self-pay | Admitting: Family Medicine

## 2021-11-04 ENCOUNTER — Other Ambulatory Visit: Payer: Self-pay

## 2021-11-04 DIAGNOSIS — I10 Essential (primary) hypertension: Secondary | ICD-10-CM

## 2021-11-04 MED ORDER — AMLODIPINE BESYLATE 10 MG PO TABS
ORAL_TABLET | ORAL | 0 refills | Status: DC
  Filled 2021-11-04: qty 30, 30d supply, fill #0

## 2021-11-06 ENCOUNTER — Other Ambulatory Visit: Payer: Self-pay

## 2021-11-07 ENCOUNTER — Other Ambulatory Visit: Payer: Self-pay

## 2021-11-07 ENCOUNTER — Other Ambulatory Visit: Payer: Self-pay | Admitting: Nurse Practitioner

## 2021-11-07 ENCOUNTER — Ambulatory Visit: Payer: Self-pay | Admitting: *Deleted

## 2021-11-07 DIAGNOSIS — G8929 Other chronic pain: Secondary | ICD-10-CM

## 2021-11-07 DIAGNOSIS — I10 Essential (primary) hypertension: Secondary | ICD-10-CM

## 2021-11-07 MED ORDER — AMLODIPINE BESYLATE 10 MG PO TABS
ORAL_TABLET | ORAL | 0 refills | Status: DC
  Filled 2021-11-07: qty 30, 30d supply, fill #0
  Filled 2021-11-07: qty 30, fill #0

## 2021-11-07 MED ORDER — GABAPENTIN 300 MG PO CAPS
300.0000 mg | ORAL_CAPSULE | Freq: Every day | ORAL | 1 refills | Status: DC
  Filled 2021-11-07: qty 30, 30d supply, fill #0
  Filled 2021-11-07: qty 90, 90d supply, fill #0

## 2021-11-07 NOTE — Telephone Encounter (Signed)
Second attempt to call patient- left message to call office. 
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
## 2021-11-07 NOTE — Telephone Encounter (Signed)
Medication has been sent to pharmacy.  °
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
## 2021-11-07 NOTE — Telephone Encounter (Signed)
Attempted to call patient- needs to schedule appointment- left message to call office.

## 2021-11-07 NOTE — Telephone Encounter (Signed)
MyChart message sent to patient to call and schedule for f/u before her next refills, noted on the amlodipine refill.  Requested Prescriptions  Pending Prescriptions Disp Refills  . amLODipine (NORVASC) 10 MG tablet 30 tablet 0    Sig: TAKE 1 TABLET(5 MG) BY MOUTH DAILY     Cardiovascular: Calcium Channel Blockers 2 Failed - 11/07/2021 10:22 AM      Failed - Last BP in normal range    BP Readings from Last 1 Encounters:  09/30/21 (!) 147/81         Passed - Last Heart Rate in normal range    Pulse Readings from Last 1 Encounters:  09/30/21 84         Passed - Valid encounter within last 6 months    Recent Outpatient Visits          4 months ago Primary hypertension   [place], [place] W, NP   6 months ago Acute pain of right knee   [place], Vernia Buff, NP   10 months ago Essential hypertension   [place], [place], [place]   1 year ago Encounter to establish care   [place], [place] W, NP             . gabapentin (NEURONTIN) 300 MG capsule 90 capsule 1    Sig: Take 1 capsule (300 mg total) by mouth at bedtime.     Neurology: Anticonvulsants - gabapentin Passed - 11/07/2021 10:22 AM      Passed - Cr in normal range and within 360 days    Creatinine  Date Value Ref Range Status  04/05/2020 0.73 0.44 - 1.00 mg/dL Final   Creatinine, Ser  Date Value Ref Range Status  04/29/2021 0.82 0.57 - 1.00 mg/dL Final         Passed - Completed PHQ-2 or PHQ-9 in the last 360 days      Passed - Valid encounter within last 12 months    Recent Outpatient Visits          4 months ago Primary hypertension   [place], Vernia Buff, NP   6 months ago Acute pain of right knee   [place], Vernia Buff, NP   10 months ago Essential hypertension   [place], [place], [place]   1 year ago Encounter to establish care   [place], Vernia Buff, NP

## 2021-11-08 ENCOUNTER — Other Ambulatory Visit: Payer: Self-pay

## 2021-11-13 ENCOUNTER — Ambulatory Visit: Payer: Self-pay

## 2021-11-13 ENCOUNTER — Other Ambulatory Visit: Payer: Self-pay | Admitting: Pharmacist

## 2021-11-13 NOTE — Patient Instructions (Addendum)
Ms. Mirian,   It was great talking to you today!  Keep up the great work with reducing salt intake, and focusing on lean proteins, fruits and vegetables, whole grains, and lower sugar intake.   Consider setting an exercise goal between now and your next appointment.   Check your blood pressure once daily, and any time you have concerning symptoms like headache, chest pain, dizziness, shortness of breath, or vision changes.   Our goal is less than 130/80.  To appropriately check your blood pressure, make sure you do the following:  1) Avoid caffeine, exercise, or tobacco products for 30 minutes before checking. Empty your bladder. 2) Sit with your back supported in a flat-backed chair. Rest your arm on something flat (arm of the chair, table, etc). 3) Sit still with your feet flat on the floor, resting, for at least 5 minutes.  4) Check your blood pressure. Take 1-2 readings.  5) Write down these readings and bring with you to any provider appointments.  Bring your home blood pressure machine with you to a provider's office for accuracy comparison at least once a year.   Make sure you take your blood pressure medications before you come to any office visit, even if you were asked to fast for labs.   Take care!  Catie Hedwig Morton, PharmD, [facility] [phone]

## 2021-11-13 NOTE — Chronic Care Management (AMB) (Signed)
Patient appearing on report for [employer] - Hypertension Control report due to last documented ambulatory blood pressure of 147/81 on 09/30/21. Next appointment with PCP is 12/05/21   Outreached patient to discuss hypertension control and medication management as discussed via MyChart yesterday. Left voicemail for patient to return my call at her convenience.   Catie Hedwig Morton, PharmD, [facility] [phone]

## 2021-11-13 NOTE — Chronic Care Management (AMB) (Signed)
Patient appearing on report for [employer] - Hypertension Control report due to last documented ambulatory blood pressure of 147/81 on 09/30/21. Next appointment with PCP is 12/05/21.    Outreached patient to discuss hypertension control and medication management.   Patient is participating in a Managed Medicaid Plan:  Yes  Current antihypertensives: amlodipine 10 mg daily  Patient has an automated upper arm home BP machine.  Current blood pressure readings: 130-140s/90s, though was much better controlled this morning  Current meal patterns: before last appointment with Zelda, was adding salt. However, she has recently shifted to a low salt, low sugar diet. Using Ms. Dash lately. Drinking sugar-free sodas.   Current physical activity: none  Patient denies side effects related to amlodipine   Assessment/Plan: - Currently controlled - - Reviewed goal blood pressure <130/80 - Reviewed appropriate administration of medication regimen - Reviewed appropriate home BP monitoring technique (avoid caffeine, smoking, and exercise for 30 minutes before checking, rest for at least 5 minutes before taking BP, sit with feet flat on the floor and back against a hard surface, uncross legs, and rest arm on flat surface) - Reviewed to check blood pressure periodically, document, and provide at next provider visit - Discussed dietary modifications, such as reduced salt intake, focus on whole grains, vegetables, lean proteins - Discussed goal of 150 minutes of moderate intensity physical activity weekly   We also discussed vaccinations. Recommend she consider and discuss Shingrix vaccine series and Tdap vaccine with her provider or pharmacy.   Catie Hedwig Morton, PharmD, [facility] [phone]

## 2021-12-05 ENCOUNTER — Encounter: Payer: Self-pay | Admitting: Physician Assistant

## 2021-12-05 ENCOUNTER — Other Ambulatory Visit: Payer: Self-pay

## 2021-12-05 ENCOUNTER — Ambulatory Visit: Payer: Medicaid Other | Attending: Physician Assistant | Admitting: Physician Assistant

## 2021-12-05 ENCOUNTER — Ambulatory Visit
Admission: RE | Admit: 2021-12-05 | Discharge: 2021-12-05 | Disposition: A | Discharge status: Home / Self Care | Payer: Medicaid Other | Source: Ambulatory Visit | Attending: Physician Assistant | Admitting: Physician Assistant

## 2021-12-05 VITALS — BP 128/84 | HR 81 | Wt 246.0 lb

## 2021-12-05 DIAGNOSIS — M25552 Pain in left hip: Secondary | ICD-10-CM

## 2021-12-05 DIAGNOSIS — I10 Essential (primary) hypertension: Secondary | ICD-10-CM | POA: Diagnosis not present

## 2021-12-05 DIAGNOSIS — M25561 Pain in right knee: Secondary | ICD-10-CM | POA: Diagnosis not present

## 2021-12-05 DIAGNOSIS — R7303 Prediabetes: Secondary | ICD-10-CM

## 2021-12-05 DIAGNOSIS — G8929 Other chronic pain: Secondary | ICD-10-CM

## 2021-12-05 DIAGNOSIS — D638 Anemia in other chronic diseases classified elsewhere: Secondary | ICD-10-CM

## 2021-12-05 MED ORDER — GABAPENTIN 300 MG PO CAPS
300.0000 mg | ORAL_CAPSULE | Freq: Every day | ORAL | 1 refills | Status: DC
  Filled 2021-12-05: qty 90, 90d supply, fill #0
  Filled 2022-03-06: qty 90, 90d supply, fill #1

## 2021-12-05 MED ORDER — AMLODIPINE BESYLATE 10 MG PO TABS
ORAL_TABLET | ORAL | 1 refills | Status: DC
  Filled 2021-12-05: qty 30, 30d supply, fill #0
  Filled 2021-12-05: qty 90, 90d supply, fill #0
  Filled 2022-01-05: qty 30, 30d supply, fill #1
  Filled 2022-02-10: qty 30, 30d supply, fill #2
  Filled 2022-03-11: qty 30, 30d supply, fill #3
  Filled 2022-04-14: qty 30, 30d supply, fill #4
  Filled 2022-05-15: qty 30, 30d supply, fill #5

## 2021-12-05 MED ORDER — AMLODIPINE BESYLATE 10 MG PO TABS
ORAL_TABLET | ORAL | 0 refills | Status: DC
  Filled 2021-12-05: qty 30, fill #0

## 2021-12-05 NOTE — Progress Notes (Signed)
Patient ID: Anne Harris, female   DOB: [birth year], 61 y.o.   MRN: [id number]   Anne Harris, is a 61 y.o. female  EGB:[id number]  [id number]  DOB - [birth year]  Chief Complaint  Patient presents with   Medication Refill       Subjective:   Anne Harris is a 61 y.o. female here today for med RF for BP and chronic knee pain.  Almost 2 years post chemo/radiation for cervical CA.  L hip pain for a few months.  Worse in morning and after periods of stillness.  Better with movement and activity.  Tylenol helps some.  Concerned due to h/o CA.  No CP/SOB/HA/Dizziness.  No fevers/night sweats.  Has an appt with oncology next month for regular interval follow up.    No problems updated.  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 Sondra Come   History of radiation therapy 12/28/2019-02/09/2020   Cervix   Dr Gery Pray   Hypertension    followed by pcp   Mediastinal adenopathy    Obesity, Class III, BMI 40-49.9 (morbid obesity) (HCC)    Pancytopenia, acquired ([place])    Pulmonary nodules    bilateral   Wears glasses     MEDICATIONS AT HOME: Prior to Admission medications   Medication Sig Start Date End Date Taking? Authorizing Provider  acetaminophen (TYLENOL) 500 MG tablet Take 1,000 mg by mouth every 6 (six) hours as needed for moderate pain.   Yes [provider]  Blood Pressure Monitor DEVI Please provide patient with insurance approved blood pressure monitor 07/01/21  Yes Gildardo Pounds, NP   amLODipine (NORVASC) 10 MG tablet TAKE 1 TABLET BY MOUTH ONCE DAILY. 12/05/21   Argentina Donovan, PA-C  gabapentin (NEURONTIN) 300 MG capsule Take 1 capsule (300 mg total) by mouth once nightly at bedtime. 12/05/21   Argentina Donovan, PA-C  prochlorperazine (COMPAZINE) 10 MG tablet Take 1 tablet (10 mg total) by mouth every 6 (six) hours as needed (Nausea or vomiting). 12/14/19 05/22/20  Heath Lark, MD    ROS: Neg HEENT Neg resp Neg cardiac Neg GI Neg GU Neg psych Neg neuro  Objective:   Vitals:   12/05/21 0931  BP: 128/84  Pulse: 81  SpO2: 97%  Weight: 246 lb (111.6 kg)   Exam General appearance : Awake, alert, not in any distress. Speech Clear. Not toxic looking.  No limp.  Normal gait HEENT: Atraumatic and Normocephalic, pupils equally reactive to light and accomodation Neck: Supple, no JVD. No cervical lymphadenopathy.  Chest: Good air entry bilaterally, CTAB.  No rales/rhonchi/wheezing CVS: S1 S2 regular, no murmurs.  Extremities: B/L Lower Ext shows no edema, both legs are warm to touch Neurology: Awake alert, and oriented X 3, CN II-XII intact, Non focal Skin: No Rash  Data Review Lab Results  Component Value Date   HGBA1C 5.7 (H) 07/01/2021   HGBA1C 5.7 (H) 12/27/2020    Assessment & Plan   1. Chronic pain of  right knee - gabapentin (NEURONTIN) 300 MG capsule; Take 1 capsule (300 mg total) by mouth once nightly at bedtime.  Dispense: 90 capsule; Refill: 1  2. Essential hypertension Controlled- - Comprehensive metabolic panel - amLODipine (NORVASC) 10 MG tablet; TAKE 1 TABLET BY MOUTH ONCE DAILY.  Dispense: 90 tablet; Refill: 1  3. Prediabetes - Hemoglobin A1c - Comprehensive metabolic panel  4. Anemia due to chronic illness - Comprehensive metabolic panel - CBC with Differential/Platelet  5. Left hip pain This has been going on for a few months-likely osteoarthritis - DG Hip Unilat W OR W/O Pelvis 2-3 Views Left; Future    Return in about 6  months (around 06/07/2022) for PCP for chonic conditions.  The patient was given clear instructions to go to ER or return to medical center if symptoms don't improve, worsen or new problems develop. The patient verbalized understanding. The patient was told to call to get lab results if they haven't heard anything in the next week.      Freeman Caldron, PA-C [facility], [place]   12/05/2021, 9:53 AM

## 2021-12-06 LAB — COMPREHENSIVE METABOLIC PANEL
ALT: 9 IU/L (ref 0–32)
AST: 15 IU/L (ref 0–40)
Albumin/Globulin Ratio: 1.5 (ref 1.2–2.2)
Albumin: 4.1 g/dL (ref 3.8–4.8)
Alkaline Phosphatase: 100 IU/L (ref 44–121)
BUN/Creatinine Ratio: 9 — ABNORMAL LOW (ref 12–28)
BUN: 9 mg/dL (ref 8–27)
Bilirubin Total: 0.3 mg/dL (ref 0.0–1.2)
CO2: 25 mmol/L (ref 20–29)
Calcium: 9.4 mg/dL (ref 8.7–10.3)
Chloride: 105 mmol/L (ref 96–106)
Creatinine, Ser: 0.97 mg/dL (ref 0.57–1.00)
Globulin, Total: 2.7 g/dL (ref 1.5–4.5)
Glucose: 94 mg/dL (ref 70–99)
Potassium: 4.4 mmol/L (ref 3.5–5.2)
Sodium: 142 mmol/L (ref 134–144)
Total Protein: 6.8 g/dL (ref 6.0–8.5)
eGFR: 66 mL/min/{1.73_m2} (ref >59)

## 2021-12-06 LAB — CBC WITH DIFFERENTIAL/PLATELET
Basophils Absolute: 0 10*3/uL (ref 0.0–0.2)
Basos: 1 %
EOS (ABSOLUTE): 0.1 10*3/uL (ref 0.0–0.4)
Eos: 2 %
Hematocrit: 36.8 % (ref 34.0–46.6)
Hemoglobin: 11.9 g/dL (ref 11.1–15.9)
Immature Grans (Abs): 0 10*3/uL (ref 0.0–0.1)
Immature Granulocytes: 0 %
Lymphocytes Absolute: 0.6 10*3/uL — ABNORMAL LOW (ref 0.7–3.1)
Lymphs: 11 %
MCH: 26.2 pg — ABNORMAL LOW (ref 26.6–33.0)
MCHC: 32.3 g/dL (ref 31.5–35.7)
MCV: 81 fL (ref 79–97)
Monocytes Absolute: 0.3 10*3/uL (ref 0.1–0.9)
Monocytes: 5 %
Neutrophils Absolute: 4.6 10*3/uL (ref 1.4–7.0)
Neutrophils: 81 %
Platelets: 236 10*3/uL (ref 150–450)
RBC: 4.55 x10E6/uL (ref 3.77–5.28)
RDW: 14.2 % (ref 11.7–15.4)
WBC: 5.7 10*3/uL (ref 3.4–10.8)

## 2021-12-06 LAB — HEMOGLOBIN A1C
Est. average glucose Bld gHb Est-mCnc: 111 mg/dL
Hgb A1c MFr Bld: 5.5 % (ref 4.8–5.6)

## 2021-12-24 ENCOUNTER — Other Ambulatory Visit: Payer: Self-pay

## 2022-01-03 ENCOUNTER — Other Ambulatory Visit: Payer: Self-pay

## 2022-01-03 ENCOUNTER — Inpatient Hospital Stay: Payer: Medicaid Other | Attending: Gynecologic Oncology | Admitting: Gynecologic Oncology

## 2022-01-03 ENCOUNTER — Encounter: Payer: Self-pay | Admitting: Gynecologic Oncology

## 2022-01-03 VITALS — BP 146/94 | HR 81 | Temp 97.5°F | Resp 16 | Wt 244.2 lb

## 2022-01-03 DIAGNOSIS — Z923 Personal history of irradiation: Secondary | ICD-10-CM | POA: Insufficient documentation

## 2022-01-03 DIAGNOSIS — Z9221 Personal history of antineoplastic chemotherapy: Secondary | ICD-10-CM | POA: Insufficient documentation

## 2022-01-03 DIAGNOSIS — Z8541 Personal history of malignant neoplasm of cervix uteri: Secondary | ICD-10-CM | POA: Diagnosis present

## 2022-01-03 DIAGNOSIS — C539 Malignant neoplasm of cervix uteri, unspecified: Secondary | ICD-10-CM

## 2022-01-03 NOTE — Progress Notes (Signed)
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  On 6/10 she underwent Pap smear.  Last Pap smear had been 30 years prior and normal per patient.     11/21/2019 Pathology Results   A. CERVIX, BIOPSY:  -  Squamous cell carcinoma    11/29/2019 PET scan   1. Highly hypermetabolic cervical mass, with fluid distended endometrial cavity. Maximum SUV of the cervical mass is 25.8. 2. Left adnexal lesion versus more likely fundal fibroid, maximum SUV 6.0. Surveillance suggested. 3. There is low-grade activity in several abdominal lymph nodes including a borderline enlarged peripancreatic lymph node. However, there is striking activity in numerous enlarged thoracic lymph nodes, some of which have associated calcifications. It would be unusual to have this degree of thoracic adenopathy from cervical cancer without having a commensurate level of abdominopelvic adenopathy. This along with the calcifications of the lymph nodes raises the possibility of active granulomatous process (such as sarcoidosis) as a potential cause for the striking thoracic adenopathy. Concomitant lymphoma might also have a similar appearance. Sampling of thoracic lymph nodes may be helpful in differentiating metastatic disease other entities such as sarcoid or Lymphoma.  4. Mild pleural/subpleural nodularity in the lungs, merit surveillance. 5. Cholelithiasis.   11/30/2019 Cancer Staging   Staging form: Cervix Uteri, AJCC Version 9 - Clinical stage from 11/30/2019: FIGO Stage IIB (cT2b, cN0, cM0) - Signed by Heath Lark, MD on 11/30/2019   12/02/2019 Imaging   Ct abdomen and pelvis 1. Cervical soft tissue  mass as described in keeping with known malignancy. 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 2. Mildly rounded subcentimeter aortocaval and right pelvic sidewall lymph nodes. 3. A 5.3 x 3.9 cm left fundal exophytic soft tissue suboptimally characterized but possibly a fibroid. 4. Cholelithiasis. 5. No bowel obstruction. Normal appendix. 6. Small bilateral lung base subpleural nodules similar to prior PET CT. Clinical correlation and follow-up recommended.   12/22/2019 Procedure   Successful placement of a right internal jugular approach power injectable Port-A-Cath. The catheter is ready for immediate use.   12/28/2019 - 03/27/2020 Radiation Therapy   12/28/2019 to 02/09/2020 followed by five HDR brachytherapy treatments from 02/27/2020 to 03/27/2020   Site: Cervix Technique: 3D Total Dose (Gy): 45/45 Dose per Fx (Gy): 1.8 Completed Fx: 25/25 Beam Energies: 6X, 10X, 15X   Site: Cervix boost Technique: 3D Total Dose (Gy): 9/9 Dose per Fx (Gy): 1.8 Completed Fx: 5/5 Beam Energies: 15X   12/29/2019 - 01/27/2020 Chemotherapy   The patient had cisplatin for chemotherapy treatment.     05/22/2020 PET scan   1. Compared with the previous PET-CT of 6 months ago, the large hypermetabolic cervical mass has resolved consistent with response to therapy. 2. The previously demonstrated partially calcified mediastinal and hilar adenopathy has mildly improved. 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 3. Stable incidental findings including a probable exophytic fundal fibroid, cholelithiasis and periodontal disease.    Radiation Therapy   Pelvic radiation and HDR brachytherapy   06/15/2020 Procedure   Successful right IJ vein Port-A-Cath explant.     Interval History: Patient reports  overall doing well.   She denies any vaginal bleeding or discharge.  She denies abdominal or pelvic pain.  She reports baseline bowel and bladder function.  She has been having some left hip pain intermittently.  Denies pain if she is up and moving, but if she sits still for a period of time, she will have pain when she gets back up again.  Saw her primary care provider a couple weeks ago and was told that x-rays confirm likely related to arthritis.  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  Was seen earlier today for follow-up.  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 Sondra Come   History of radiation therapy 12/28/2019-02/09/2020   Cervix   Dr Gery Pray   Hypertension    followed by pcp   Mediastinal adenopathy    Obesity, Class III, BMI 40-49.9 (morbid obesity) ([place])    Pancytopenia, acquired ([place])    Pulmonary nodules    bilateral   Wears glasses     Past Surgical History:  Procedure Laterality Date   BRONCHIAL BIOPSY  12/15/2019   Procedure: BRONCHIAL BIOPSIES;  Surgeon: Garner Nash, DO;  Location: [place] [facility];  Service: Pulmonary;;   BRONCHIAL NEEDLE ASPIRATION BIOPSY  12/15/2019   Procedure: BRONCHIAL NEEDLE ASPIRATION BIOPSIES;  Surgeon: Garner Nash, DO;  Location: [place] [facility];  Service: Pulmonary;;   BRONCHIAL WASHINGS  12/15/2019   Procedure: BRONCHIAL WASHINGS;  Surgeon: Garner Nash, DO;  Location: [place] [facility];  Service: Pulmonary;;   CERVICAL BIOPSY     ENDOBRONCHIAL ULTRASOUND N/A 12/15/2019   Procedure: ENDOBRONCHIAL ULTRASOUND;  Surgeon: Garner Nash, DO;  Location: [place];  Service: Pulmonary;   Laterality: N/A;   IR IMAGING GUIDED PORT INSERTION  12/22/2019   IR REMOVAL TUN ACCESS W/ PORT W/O FL MOD SED  06/14/2020   OPERATIVE ULTRASOUND N/A 02/27/2020   Procedure: OPERATIVE ULTRASOUND;  Surgeon: Gery Pray, MD;  Location: [facility];  Service: Urology;  Laterality: N/A;   OPERATIVE ULTRASOUND N/A 03/05/2020   Procedure: OPERATIVE ULTRASOUND;  Surgeon: Gery Pray, MD;  Location[facility];  Service: Urology;  Laterality: N/A;   OPERATIVE ULTRASOUND N/A 03/14/2020   Procedure: OPERATIVE ULTRASOUND;  Surgeon: Gery Pray, MD;  Location: [facility];  Service: Urology;  Laterality: N/A;   OPERATIVE ULTRASOUND N/A 03/23/2020   Procedure: OPERATIVE ULTRASOUND;  Surgeon: Gery Pray, MD;  Location: [facility];  Service: Urology;  Laterality: N/A;   OPERATIVE ULTRASOUND N/A 03/27/2020   Procedure: OPERATIVE ULTRASOUND;  Surgeon: Gery Pray, MD;  Location: [facility];  Service: Urology;  Laterality: N/A;   TANDEM RING INSERTION N/A 02/27/2020   Procedure: TANDEM RING INSERTION;  Surgeon: Gery Pray, MD;  Location: [facility];  Service: Urology;  Laterality: N/A;   TANDEM RING INSERTION N/A 03/05/2020   Procedure: TANDEM RING INSERTION;  Surgeon: Gery Pray, MD;  Location: [facility];  Service: Urology;  Laterality: N/A;   TANDEM RING INSERTION N/A 03/14/2020   Procedure: TANDEM RING INSERTION FOR HIGH DOSE RADIATION THERAPY;  Surgeon: Gery Pray,  MD;  Location: [place];  Service: Urology;  Laterality: N/A;   TANDEM RING INSERTION N/A 03/23/2020   Procedure: TANDEM RING INSERTION;  Surgeon: Gery Pray, MD;  Location: [facility];  Service: Urology;  Laterality: N/A;   TANDEM RING INSERTION N/A 03/27/2020   Procedure: TANDEM RING INSERTION;  Surgeon: Gery Pray, MD;  Location: [facility];  Service: Urology;   Laterality: N/A;  ULTRASOUND AND TECH    Family History  Problem Relation Age of Onset   Colon cancer Mother 12   Ovarian cancer Neg Hx    Uterine cancer Neg Hx    Breast cancer Neg Hx    Cervical cancer Neg Hx     Social History   Socioeconomic History   Marital status: Single    Spouse name: Not on file   Number of children: 6   Years of education: Not on file   Highest education level: High school graduate  Occupational History   Occupation: care giver  Tobacco Use   Smoking status: Never   Smokeless tobacco: Never  Vaping Use   Vaping Use: Never used  Substance and Sexual Activity   Alcohol use: Yes    Comment: social   Drug use: Never   Sexual activity: Not Currently  Other Topics Concern   Not on file  Social History Narrative   Not on file   Social Determinants of Health   Financial Resource Strain: Not on file  Food Insecurity: Not on file  Transportation Needs: No Transportation Needs (11/10/2019)   PRAPARE - Hydrologist (Medical): No    Lack of Transportation (Non-Medical): No  Physical Activity: Not on file  Stress: Not on file  Social Connections: Not on file    Current Medications:  Current Outpatient Medications:    acetaminophen (TYLENOL) 500 MG tablet, Take 1,000 mg by mouth every 6 (six) hours as needed for moderate pain., Disp: , Rfl:    amLODipine (NORVASC) 10 MG tablet, TAKE 1 TABLET BY MOUTH ONCE DAILY., Disp: 90 tablet, Rfl: 1   Blood Pressure Monitor DEVI, Please provide patient with insurance approved blood pressure monitor, Disp: 1 each, Rfl: 0   gabapentin (NEURONTIN) 300 MG capsule, Take 1 capsule (300 mg total) by mouth once nightly at bedtime., Disp: 90 capsule, Rfl: 1  Review of Systems: Denies appetite changes, fevers, chills, fatigue, unexplained weight changes. Denies hearing loss, neck lumps or masses, mouth sores, ringing in ears or voice changes. Denies cough or wheezing.  Denies shortness of  breath. Denies chest pain or palpitations. Denies leg swelling. Denies abdominal distention, pain, blood in stools, constipation, diarrhea, nausea, vomiting, or early satiety. Denies pain with intercourse, dysuria, frequency, hematuria or incontinence. Denies hot flashes, pelvic pain, vaginal bleeding or vaginal discharge.   Denies joint pain, back pain or muscle pain/cramps. Denies itching, rash, or wounds. Denies dizziness, headaches, numbness or seizures. Denies swollen lymph nodes or glands, denies easy bruising or bleeding. Denies anxiety, depression, confusion, or decreased concentration.  Physical Exam: BP (!) 146/94 (BP Location: Right Arm, Patient Position: Sitting)   Pulse 81   Temp (!) 97.5 F (36.4 C) (Tympanic)   Resp 16   Wt 244 lb 4 oz (110.8 kg)   SpO2 100%   BMI 40.65 kg/m  General: Alert, oriented, no acute distress. HEENT: Normocephalic, atraumatic, sclera anicteric. Chest: Clear to auscultation bilaterally.  No wheezes or rhonchi. Cardiovascular: Regular rate and rhythm, no murmurs. Abdomen:  Obese, soft, nontender.  Normoactive bowel sounds.  No masses or hepatosplenomegaly appreciated.   Extremities: Grossly normal range of motion.  Warm, well perfused.  No edema bilaterally. Skin: No rashes or lesions noted. Lymphatics: No cervical, supraclavicular, or inguinal adenopathy. GU: Normal appearing external genitalia without erythema, excoriation, or lesions.  Speculum exam reveals atrophic vaginal mucosa, radiation changes present.  No lesions on the cervix, no bleeding or discharge.  Bimanual exam reveals cervix smooth, flush with vaginal apex, no nodularity.  Rectovaginal exam confirms findings.  Laboratory & Radiologic Studies: Pap 01/2021 - ASCUS, HR HPV negative  Assessment & Plan: Anne Harris is a 61 y.o. woman with history of Stage IIB SCC of the cervix who presents for surveillance visit. Definitive chemoradiation completed in 03/2020.   The  patient is doing very well and is NED on exam today.     She continues to use her vaginal dilator twice a week.  We will continue with yearly Pap tests, which she will be due for at her October (she will have this at her visit with Dr. Sondra Come on 11/2).    Per NCCN surveillance recommendations, we will continue visits every 3 months alternating between my office and radiation oncology.  I will plan to see her back in 6 months.  She was asked to call the office in December to schedule a visit with me in February.  If she remains NED at that time, we will discuss transitioning to visit every 6 months. We discussed signs and symptoms that would be concerning for disease recurrence and she knows to call if she develops any of these.  20 minutes of total time was spent for this patient encounter, including preparation, face-to-face counseling with the patient and coordination of care, and documentation of the encounter.  Jeral Pinch, MD  Division of Gynecologic Oncology  Department of Obstetrics and Gynecology  [facility]

## 2022-01-03 NOTE — Patient Instructions (Signed)
It was good to see you today. I don't see or feel any evidence of cancer recurrence on your exam.  You will have a pap test with Dr. Sondra Come at your November visit.   I will see you in 6 months. Please call the office in December to schedule a visit with me in February 2024.    As always, please call to see me sooner if you develop any symptoms concerning for cancer recurrence.

## 2022-01-06 ENCOUNTER — Other Ambulatory Visit: Payer: Self-pay

## 2022-02-10 ENCOUNTER — Other Ambulatory Visit: Payer: Self-pay

## 2022-02-11 ENCOUNTER — Other Ambulatory Visit: Payer: Self-pay

## 2022-02-12 ENCOUNTER — Other Ambulatory Visit: Payer: Self-pay

## 2022-03-06 ENCOUNTER — Other Ambulatory Visit: Payer: Self-pay

## 2022-03-11 ENCOUNTER — Other Ambulatory Visit: Payer: Self-pay

## 2022-04-02 NOTE — Progress Notes (Signed)
Radiation Oncology         [phone] ________________________________  Name: Anne Harris MRN: [id number]  Date: 04/03/2022  DOB: September 13, 1960  Follow-Up Visit Note  CC: Gildardo Pounds, NP  Gildardo Pounds, NP  No diagnosis found.  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 Since her last visit, the patient followed up with Dr. Berline Lopes on 01/03/22. During which time, the patient reported some recent intermittent right hip pain after sitting for long periods of time. She saw her PCP the month prior for this and had an x-ray performed on 12/05/21 which showed findings consistent with arthritis of the left hip. She otherwise denied any symptoms concerning for disease recurrence and was NED on examination.  ***                              Allergies:  is allergic to nsaids.  Meds: Current Outpatient Medications  Medication Sig Dispense Refill   acetaminophen (TYLENOL) 500 MG tablet Take 1,000 mg by mouth every 6 (six) hours as needed for moderate pain.     amLODipine (NORVASC) 10 MG tablet TAKE 1 TABLET BY MOUTH ONCE DAILY. 90 tablet 1   Blood Pressure Monitor DEVI Please provide patient with insurance approved blood pressure monitor 1 each 0   gabapentin (NEURONTIN) 300 MG capsule Take 1 capsule (300 mg total) by mouth once nightly at bedtime. 90 capsule 1   No current facility-administered medications for this encounter.     Physical Findings: The patient is in no acute distress. Patient is alert and oriented.  vitals were not taken for this visit. .  No significant changes. Lungs are clear to auscultation bilaterally. Heart has regular rate and rhythm. No palpable cervical, supraclavicular, or axillary adenopathy. Abdomen soft, non-tender, normal bowel sounds.  On pelvic examination the external genitalia were unremarkable. A speculum exam was performed. There are no mucosal lesions noted in the vaginal vault. A Pap smear was obtained of the proximal vagina. On bimanual and rectovaginal examination there were no pelvic masses appreciated. ***    Lab Findings: Lab Results  Component Value Date   WBC 5.7 12/05/2021   HGB 11.9 12/05/2021   HCT 36.8 12/05/2021   MCV 81 12/05/2021   PLT 236 12/05/2021    Radiographic Findings: No results found.  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  ***  Plan:  ***   *** minutes of total time was spent for this patient encounter, including preparation, face-to-face counseling with the patient and coordination of care, physical exam, and documentation of the encounter. ____________________________________  Blair Promise, PhD, MD  This document serves as a record of services personally performed by Gery Pray, MD. It was created on his behalf by Roney Mans, a trained medical scribe. The creation of  this record is based on the scribe's personal observations and the provider's statements to them. This document has been checked and approved by the attending provider.

## 2022-04-03 ENCOUNTER — Other Ambulatory Visit (HOSPITAL_COMMUNITY)
Admission: RE | Admit: 2022-04-03 | Discharge: 2022-04-03 | Disposition: A | Discharge status: Home / Self Care | Payer: Medicaid Other | Source: Ambulatory Visit | Attending: Radiation Oncology | Admitting: Radiation Oncology

## 2022-04-03 ENCOUNTER — Ambulatory Visit
Admission: RE | Admit: 2022-04-03 | Discharge: 2022-04-03 | Disposition: A | Discharge status: Home / Self Care | Payer: Medicaid Other | Source: Ambulatory Visit | Attending: Radiation Oncology | Admitting: Radiation Oncology

## 2022-04-03 ENCOUNTER — Other Ambulatory Visit: Payer: Self-pay

## 2022-04-03 ENCOUNTER — Encounter: Payer: Self-pay | Admitting: Radiation Oncology

## 2022-04-03 VITALS — BP 153/86 | HR 81 | Temp 96.0°F | Resp 18 | Ht 65.0 in | Wt 259.2 lb

## 2022-04-03 DIAGNOSIS — C539 Malignant neoplasm of cervix uteri, unspecified: Secondary | ICD-10-CM | POA: Diagnosis present

## 2022-04-03 NOTE — Progress Notes (Signed)
CIRCE CHILTON is here today for follow up post radiation to the pelvic.  They completed their radiation on: 02/09/20  Does the patient complain of any of the following:  Pain: No Abdominal bloating: No Diarrhea/Constipation: No Nausea/Vomiting: No Vaginal Discharge: No Blood in Urine or Stool: No Urinary Issues (dysuria/incomplete emptying/ incontinence/ increased frequency/urgency): No Does patient report using vaginal dilator 2-3 times a week and/or sexually active 2-3 weeks: Yes Post radiation skin changes: No    Additional comments if applicable:   BP (!) 037/54 (BP Location: Left Wrist, Patient Position: Sitting)   Pulse 81   Temp (!) 96 F (35.6 C) (Temporal)   Resp 18   Ht '5\' 5"'$  (1.651 m)   Wt 259 lb 4 oz (117.6 kg)   SpO2 100%   BMI 43.14 kg/m

## 2022-04-07 LAB — CYTOLOGY - PAP
Diagnosis: NEGATIVE
Diagnosis: REACTIVE

## 2022-04-11 ENCOUNTER — Telehealth: Payer: Self-pay

## 2022-04-11 NOTE — Telephone Encounter (Signed)
Call placed to patient to make aware of normal PAP results. Patient voiced understanding.

## 2022-04-14 ENCOUNTER — Other Ambulatory Visit: Payer: Self-pay

## 2022-04-15 ENCOUNTER — Other Ambulatory Visit: Payer: Self-pay

## 2022-05-15 ENCOUNTER — Other Ambulatory Visit: Payer: Self-pay

## 2022-05-16 ENCOUNTER — Other Ambulatory Visit: Payer: Self-pay

## 2022-06-08 ENCOUNTER — Other Ambulatory Visit: Payer: Self-pay | Admitting: Physician Assistant

## 2022-06-08 DIAGNOSIS — G8929 Other chronic pain: Secondary | ICD-10-CM

## 2022-06-08 DIAGNOSIS — I10 Essential (primary) hypertension: Secondary | ICD-10-CM

## 2022-06-09 ENCOUNTER — Other Ambulatory Visit: Payer: Self-pay

## 2022-06-09 MED ORDER — AMLODIPINE BESYLATE 10 MG PO TABS
10.0000 mg | ORAL_TABLET | Freq: Every day | ORAL | 0 refills | Status: DC
  Filled 2022-06-09: qty 90, 90d supply, fill #0

## 2022-06-09 MED ORDER — GABAPENTIN 300 MG PO CAPS
300.0000 mg | ORAL_CAPSULE | Freq: Every day | ORAL | 0 refills | Status: DC
  Filled 2022-06-09: qty 90, 90d supply, fill #0

## 2022-06-09 NOTE — Telephone Encounter (Signed)
Requested Prescriptions  Pending Prescriptions Disp Refills   gabapentin (NEURONTIN) 300 MG capsule 90 capsule 0    Sig: Take 1 capsule (300 mg total) by mouth once nightly at bedtime.     Neurology: Anticonvulsants - gabapentin Passed - 06/08/2022 12:30 PM      Passed - Cr in normal range and within 360 days    Creatinine  Date Value Ref Range Status  04/05/2020 0.73 0.44 - 1.00 mg/dL Final   Creatinine, Ser  Date Value Ref Range Status  12/05/2021 0.97 0.57 - 1.00 mg/dL Final         Passed - Completed PHQ-2 or PHQ-9 in the last 360 days      Passed - Valid encounter within last 12 months    Recent Outpatient Visits           6 months ago Prediabetes   [facility], [place], [place]   11 months ago Primary hypertension   [place], Vernia Buff, NP   1 year ago Acute pain of right knee   [place], Vernia Buff, NP   1 year ago Essential hypertension   [place], [place], [place]   2 years ago Encounter to establish care   [place], NP               amLODipine (NORVASC) 10 MG tablet 90 tablet 0    Sig: TAKE 1 TABLET BY MOUTH ONCE DAILY.     Cardiovascular: Calcium Channel Blockers 2 Failed - 06/08/2022 12:30 PM      Failed - Last BP in normal range    BP Readings from Last 1 Encounters:  04/03/22 (!) 153/86         Failed - Valid encounter within last 6 months    Recent Outpatient Visits           6 months ago Prediabetes   [place], [place]   11 months ago Primary hypertension   [place], Vernia Buff, NP   1 year ago Acute pain of right knee   [place], Vernia Buff, NP   1 year ago Essential hypertension   [place], [place]   2 years ago Encounter to establish care   [place], NP              Passed - Last Heart Rate in normal range    Pulse Readings from Last 1 Encounters:  04/03/22 81

## 2022-06-13 ENCOUNTER — Other Ambulatory Visit: Payer: Self-pay

## 2022-09-04 ENCOUNTER — Other Ambulatory Visit: Payer: Self-pay

## 2022-09-04 ENCOUNTER — Other Ambulatory Visit: Payer: Self-pay | Admitting: Nurse Practitioner

## 2022-09-04 DIAGNOSIS — G8929 Other chronic pain: Secondary | ICD-10-CM

## 2022-09-04 DIAGNOSIS — I10 Essential (primary) hypertension: Secondary | ICD-10-CM

## 2022-09-04 MED ORDER — GABAPENTIN 300 MG PO CAPS
300.0000 mg | ORAL_CAPSULE | Freq: Every day | ORAL | 0 refills | Status: DC
  Filled 2022-09-04: qty 90, 90d supply, fill #0

## 2022-09-04 MED ORDER — AMLODIPINE BESYLATE 10 MG PO TABS
10.0000 mg | ORAL_TABLET | Freq: Every day | ORAL | 0 refills | Status: DC
  Filled 2022-09-04: qty 7, 7d supply, fill #0

## 2022-09-04 NOTE — Telephone Encounter (Signed)
Left a voicemail for pt. To call in and make an appt with [place] for her yearly physical.

## 2022-09-08 ENCOUNTER — Other Ambulatory Visit: Payer: Self-pay

## 2022-09-08 ENCOUNTER — Ambulatory Visit: Payer: Medicaid Other | Attending: Internal Medicine | Admitting: Internal Medicine

## 2022-09-08 ENCOUNTER — Telehealth: Payer: Self-pay | Admitting: *Deleted

## 2022-09-08 ENCOUNTER — Encounter: Payer: Self-pay | Admitting: Internal Medicine

## 2022-09-08 VITALS — BP 132/85 | HR 73 | Temp 98.1°F | Ht 65.0 in | Wt 261.0 lb

## 2022-09-08 DIAGNOSIS — D170 Benign lipomatous neoplasm of skin and subcutaneous tissue of head, face and neck: Secondary | ICD-10-CM | POA: Diagnosis not present

## 2022-09-08 DIAGNOSIS — I10 Essential (primary) hypertension: Secondary | ICD-10-CM

## 2022-09-08 DIAGNOSIS — Z1231 Encounter for screening mammogram for malignant neoplasm of breast: Secondary | ICD-10-CM

## 2022-09-08 DIAGNOSIS — Z8541 Personal history of malignant neoplasm of cervix uteri: Secondary | ICD-10-CM | POA: Diagnosis not present

## 2022-09-08 DIAGNOSIS — Z9189 Other specified personal risk factors, not elsewhere classified: Secondary | ICD-10-CM | POA: Diagnosis not present

## 2022-09-08 DIAGNOSIS — Z0000 Encounter for general adult medical examination without abnormal findings: Secondary | ICD-10-CM

## 2022-09-08 DIAGNOSIS — Z0001 Encounter for general adult medical examination with abnormal findings: Secondary | ICD-10-CM

## 2022-09-08 DIAGNOSIS — Z6841 Body Mass Index (BMI) 40.0 and over, adult: Secondary | ICD-10-CM

## 2022-09-08 DIAGNOSIS — Z1211 Encounter for screening for malignant neoplasm of colon: Secondary | ICD-10-CM

## 2022-09-08 DIAGNOSIS — Z23 Encounter for immunization: Secondary | ICD-10-CM

## 2022-09-08 DIAGNOSIS — M1612 Unilateral primary osteoarthritis, left hip: Secondary | ICD-10-CM | POA: Diagnosis not present

## 2022-09-08 MED ORDER — GABAPENTIN 300 MG PO CAPS
300.0000 mg | ORAL_CAPSULE | Freq: Every day | ORAL | 0 refills | Status: DC
  Filled 2022-12-03: qty 90, 90d supply, fill #0

## 2022-09-08 MED ORDER — AMLODIPINE BESYLATE 10 MG PO TABS
10.0000 mg | ORAL_TABLET | Freq: Every day | ORAL | 0 refills | Status: DC
  Filled 2022-09-08: qty 90, 90d supply, fill #0

## 2022-09-08 NOTE — Telephone Encounter (Signed)
Patient called and scheduled a follow up appt with [facility]

## 2022-09-08 NOTE — Patient Instructions (Signed)
Preventive Care 40-64 Years Old, Female Preventive care refers to lifestyle choices and visits with your health care provider that can promote health and wellness. Preventive care visits are also called wellness exams. What can I expect for my preventive care visit? Counseling Your health care provider may ask you questions about your: Medical history, including: Past medical problems. Family medical history. Pregnancy history. Current health, including: Menstrual cycle. Method of birth control. Emotional well-being. Home life and relationship well-being. Sexual activity and sexual health. Lifestyle, including: Alcohol, nicotine or tobacco, and drug use. Access to firearms. Diet, exercise, and sleep habits. Work and work environment. Sunscreen use. Safety issues such as seatbelt and bike helmet use. Physical exam Your health care provider will check your: Height and weight. These may be used to calculate your BMI (body mass index). BMI is a measurement that tells if you are at a healthy weight. Waist circumference. This measures the distance around your waistline. This measurement also tells if you are at a healthy weight and may help predict your risk of certain diseases, such as type 2 diabetes and high blood pressure. Heart rate and blood pressure. Body temperature. Skin for abnormal spots. What immunizations do I need?  Vaccines are usually given at various ages, according to a schedule. Your health care provider will recommend vaccines for you based on your age, medical history, and lifestyle or other factors, such as travel or where you work. What tests do I need? Screening Your health care provider may recommend screening tests for certain conditions. This may include: Lipid and cholesterol levels. Diabetes screening. This is done by checking your blood sugar (glucose) after you have not eaten for a while (fasting). Pelvic exam and Pap test. Hepatitis B test. Hepatitis C  test. HIV (human immunodeficiency virus) test. STI (sexually transmitted infection) testing, if you are at risk. Lung cancer screening. Colorectal cancer screening. Mammogram. Talk with your health care provider about when you should start having regular mammograms. This may depend on whether you have a family history of breast cancer. BRCA-related cancer screening. This may be done if you have a family history of breast, ovarian, tubal, or peritoneal cancers. Bone density scan. This is done to screen for osteoporosis. Talk with your health care provider about your test results, treatment options, and if necessary, the need for more tests. Follow these instructions at home: Eating and drinking  Eat a diet that includes fresh fruits and vegetables, whole grains, lean protein, and low-fat dairy products. Take vitamin and mineral supplements as recommended by your health care provider. Do not drink alcohol if: Your health care provider tells you not to drink. You are pregnant, may be pregnant, or are planning to become pregnant. If you drink alcohol: Limit how much you have to 0-1 drink a day. Know how much alcohol is in your drink. In the [place] one drink equals one 12 oz bottle of beer (355 mL), one 5 oz glass of wine (148 mL), or one 1 oz glass of hard liquor (44 mL). Lifestyle Brush your teeth every morning and night with fluoride toothpaste. Floss one time each day. Exercise for at least 30 minutes 5 or more days each week. Do not use any products that contain nicotine or tobacco. These products include cigarettes, chewing tobacco, and vaping devices, such as e-cigarettes. If you need help quitting, ask your health care provider. Do not use drugs. If you are sexually active, practice safe sex. Use a condom or other form of protection to   prevent STIs. If you do not wish to become pregnant, use a form of birth control. If you plan to become pregnant, see your health care provider for a  prepregnancy visit. Take aspirin only as told by your health care provider. Make sure that you understand how much to take and what form to take. Work with your health care provider to find out whether it is safe and beneficial for you to take aspirin daily. Find healthy ways to manage stress, such as: Meditation, yoga, or listening to music. Journaling. Talking to a trusted person. Spending time with friends and family. Minimize exposure to UV radiation to reduce your risk of skin cancer. Safety Always wear your seat belt while driving or riding in a vehicle. Do not drive: If you have been drinking alcohol. Do not ride with someone who has been drinking. When you are tired or distracted. While texting. If you have been using any mind-altering substances or drugs. Wear a helmet and other protective equipment during sports activities. If you have firearms in your house, make sure you follow all gun safety procedures. Seek help if you have been physically or sexually abused. What's next? Visit your health care provider once a year for an annual wellness visit. Ask your health care provider how often you should have your eyes and teeth checked. Stay up to date on all vaccines. This information is not intended to replace advice given to you by your health care provider. Make sure you discuss any questions you have with your health care provider. Document Revised: 11/14/2020 Document Reviewed: 11/14/2020 [employer] Patient Education  2023 [employer]  

## 2022-09-08 NOTE — Progress Notes (Signed)
Patient ID: Anne Harris, female    DOB: Jul 18, 1960  MRN: [id number]  CC: Annual Exam (Physical. Med refills. )   Subjective: Anne Harris is a 62 y.o. female who presents for physical.  PCP is NP Meredeth Ide Her concerns today include:  Patient with history of of HTN, prediabetes/morbid obesity, OA of the hip, cervical Ca followed by Dr. Pricilla Holm and Dr. Roselind Messier  HTN:  on Norvasc 10 mg and took this a.m Checks BP 2x/day.  Range 132-140/82-84 Limits salt in foods No CP/SOB  Morbid Obesity/hx of PreDM:   Has increase exercise - walks around her block for 10 mins once a wk Feels she is doing better with eating habits - drinking more water. Meats: fish and chicken; no pork at all, no fried foods.  Loves banana and grapes. Loves veggies  Cervical CA:  in remission. Treated with XRT and chem. [facility] PAP 04/2022.   Last saw Dr. Pricilla Holm 12/2021.  Plan was for her to f/u in 6 mths which would have been 07/2022. No vaginal bleeding  HM:  due for MMG and colon CA screen.  Mom died from colon CA, died at age 48 from it.  Had 2 shingles vaccine at [facility] [address]. Due for Tdapt.  Last COVID booster 03/2022 at [facility] on [address].   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     amLODipine (NORVASC) 10 MG tablet Take 1 tablet (10 mg total) by mouth daily. 7 tablet 0   Blood Pressure Monitor DEVI Please provide patient with insurance approved blood pressure monitor 1 each 0   gabapentin (NEURONTIN) 300 MG capsule  Take 1 capsule (300 mg total) by mouth once nightly at bedtime. 90 capsule 0   [DISCONTINUED] prochlorperazine (COMPAZINE) 10 MG tablet Take 1 tablet (10 mg total) by mouth every 6 (six) hours as needed (Nausea or vomiting). 30 tablet 1   No current facility-administered medications on file prior to visit.    Allergies  Allergen Reactions   Nsaids     Not recommended per oncology due to HTN and potential for ARI    Social History   Socioeconomic History   Marital status: Single    Spouse name: Not on file   Number of children: 6   Years of education: Not on file   Highest education level: High school graduate  Occupational History   Occupation: care giver  Tobacco Use   Smoking status: Never   Smokeless tobacco: Never  Vaping Use   Vaping Use: Never used  Substance and Sexual Activity   Alcohol use: Yes    Comment: social   Drug use: Never   Sexual activity: Not Currently  Other Topics Concern   Not on file  Social History Narrative   Not on file   Social Determinants of Health   Financial Resource Strain: Not on file  Food Insecurity: Not on file  Transportation Needs: No Transportation Needs (11/10/2019)   PRAPARE - Transportation  Lack of Transportation (Medical): No    Lack of Transportation (Non-Medical): No  Physical Activity: Not on file  Stress: Not on file  Social Connections: Not on file  Intimate Partner Violence: Not on file    Family History  Problem Relation Age of Onset   Colon cancer Mother [id number]   Ovarian cancer Neg Hx    Uterine cancer Neg Hx    Breast cancer Neg Hx    Cervical cancer Neg Hx     Past Surgical History:  Procedure Laterality Date   BRONCHIAL BIOPSY  12/15/2019   Procedure: BRONCHIAL BIOPSIES;  Surgeon: Josephine IgoIcard, Bradley L, DO;  [facility];  Service: Pulmonary;;   BRONCHIAL NEEDLE ASPIRATION BIOPSY  12/15/2019   Procedure: BRONCHIAL NEEDLE ASPIRATION BIOPSIES;  Surgeon: Josephine IgoIcard, Bradley L, DO;  [facility];   Service: Pulmonary;;   BRONCHIAL WASHINGS  12/15/2019   Procedure: BRONCHIAL WASHINGS;  Surgeon: Josephine IgoIcard, Bradley L, DO;  [facility];  Service: Pulmonary;;   CERVICAL BIOPSY     ENDOBRONCHIAL ULTRASOUND N/A 12/15/2019   Procedure: ENDOBRONCHIAL ULTRASOUND;  Surgeon: Josephine IgoIcard, Bradley L, DO;  [facility];  Service: Pulmonary;  Laterality: N/A;   IR IMAGING GUIDED PORT INSERTION  12/22/2019   IR REMOVAL TUN ACCESS W/ PORT W/O FL MOD SED  06/14/2020   OPERATIVE ULTRASOUND N/A 02/27/2020   Procedure: OPERATIVE ULTRASOUND;  Surgeon: Antony BlackbirdKinard, James, MD;  [facility] [place];  Service: Urology;  Laterality: N/A;   OPERATIVE ULTRASOUND N/A 03/05/2020   Procedure: OPERATIVE ULTRASOUND;  Surgeon: Antony BlackbirdKinard, James, MD;  [facility] [place];  Service: Urology;  Laterality: N/A;   OPERATIVE ULTRASOUND N/A 03/14/2020   Procedure: OPERATIVE ULTRASOUND;  Surgeon: Antony BlackbirdKinard, James, MD;  [facility] [place];  Service: Urology;  Laterality: N/A;   OPERATIVE ULTRASOUND N/A 03/23/2020   Procedure: OPERATIVE ULTRASOUND;  Surgeon: Antony BlackbirdKinard, James, MD;  [facility] [place];  Service: Urology;  Laterality: N/A;   OPERATIVE ULTRASOUND N/A 03/27/2020   Procedure: OPERATIVE ULTRASOUND;  Surgeon: Antony BlackbirdKinard, James, MD;  [facility] [place];  Service: Urology;  Laterality: N/A;   TANDEM RING INSERTION N/A 02/27/2020   Procedure: TANDEM RING INSERTION;  Surgeon: Antony BlackbirdKinard, James, MD;  [facility] [place];  Service: Urology;  Laterality: N/A;   TANDEM RING INSERTION N/A 03/05/2020   Procedure: TANDEM RING INSERTION;  Surgeon: Antony BlackbirdKinard, James, MD;  [facility] [place];  Service: Urology;  Laterality: N/A;   TANDEM RING INSERTION N/A 03/14/2020   Procedure: TANDEM RING INSERTION FOR HIGH DOSE RADIATION THERAPY;  Surgeon: Antony BlackbirdKinard, James, MD;  [facility] [place];  Service: Urology;  Laterality: N/A;    TANDEM RING INSERTION N/A 03/23/2020   Procedure: TANDEM RING INSERTION;  Surgeon: Antony BlackbirdKinard, James, MD;  [facility] [place];  Service: Urology;  Laterality: N/A;   TANDEM RING INSERTION N/A 03/27/2020   Procedure: TANDEM RING INSERTION;  Surgeon: Antony BlackbirdKinard, James, MD;  [facility] [place];  Service: Urology;  Laterality: N/A;  ULTRASOUND AND TECH    ROS: Review of Systems  HENT:  Negative for congestion, hearing loss and sore throat.        She has not seen a dentist in quite some time.  Eyes:        Had eye exam last week at [facility].  She is nearsighted.  Prescription glasses prescribed.  Respiratory:  Negative for cough.   Cardiovascular:  Negative for chest pain and palpitations.  Gastrointestinal:        Moving bowels okay.  No blood in the stools.  Genitourinary:  Negative for difficulty urinating.  Musculoskeletal:        Requests refill on gabapentin which she takes for her knees and left hip.  Left hip is most bothersome.  She has history of arthritis in the hip.  NSAID listed on allergies.  Patient states she does not have allergies to NSAIDs the issue is that the increase her blood pressure.   Negative except as stated above  PHYSICAL EXAM: BP 132/85 (BP Location: Left Arm, Patient Position: Sitting, Cuff Size: Large)   Pulse 73   Temp 98.1 F (36.7 C) (Oral)   Ht 5\' 5"  (1.651 m)   Wt 261 lb (118.4 kg)   SpO2 99%   BMI 43.43 kg/m   Wt Readings from Last 3 Encounters:  09/08/22 261 lb (118.4 kg)  04/03/22 259 lb 4 oz (117.6 kg)  01/03/22 244 lb 4 oz (110.8 kg)   Repeat BP 145/83 Physical Exam  General appearance - alert, well appearing, morbidly obese older African-American female and in no distress Mental status - normal mood, behavior, speech, dress, motor activity, and thought processes Eyes - pupils equal and reactive, extraocular eye movements intact Ears - bilateral TM's and external ear canals normal Nose - normal and  patent, no erythema, discharge or polyps Mouth - mucous membranes moist, pharynx normal without lesions.  Quite a number of teeth missing.  She has plaque buildup. Neck - supple, no significant adenopathy, no thyroid enlargement.  No carotid bruits..  Noted to have fullness in space above the clavicle on both sides left greater than right.  Patient states she has had it for quite a while, not changed in size and not bothersome. Lymphatics - no palpable lymphadenopathy, no hepatosplenomegaly Chest - clear to auscultation, no wheezes, rales or rhonchi, symmetric air entry Heart - normal rate, regular rhythm, normal S1, S2, no murmurs, rubs, clicks or gallops Abdomen - soft, nontender, nondistended, no masses or organomegaly Breasts -CMA Clarissa present as chaperone breasts appear normal, no suspicious masses, no skin or nipple changes or axillary nodes Musculoskeletal -knees: No point tenderness.  Good range of motion.  Left hip: No point tenderness.  Mild limitation with flexion and rotation. Extremities - peripheral pulses normal, no pedal edema, no clubbing or cyanosis     09/08/2022    8:42 AM 12/05/2021    9:33 AM 07/01/2021   12:01 PM  Depression screen PHQ 2/9  Decreased Interest 0 0 0  Down, Depressed, Hopeless 0 0 0  PHQ - 2 Score 0 0 0  Altered sleeping 0  0  Tired, decreased energy 0  0  Change in appetite 0  0  Feeling bad or failure about yourself  0  0  Trouble concentrating 0  0  Moving slowly or fidgety/restless 0  0  Suicidal thoughts 0  0  PHQ-9 Score 0  0  Difficult doing work/chores   Not difficult at all       Latest Ref Rng & Units 12/05/2021    9:49 AM 04/29/2021    9:54 AM 12/27/2020    9:20 AM  CMP  Glucose 70 - 99 mg/dL 94  700  174   BUN 8 - 27 mg/dL 9  12  10    Creatinine 0.57 - 1.00 mg/dL 9.44  9.67  5.91   Sodium 134 - 144 mmol/L 142  143  141  Potassium 3.5 - 5.2 mmol/L 4.4  4.1  4.0   Chloride 96 - 106 mmol/L 105  105  103   CO2 20 - 29 mmol/L 25  22   24    Calcium 8.7 - 10.3 mg/dL 9.4  9.3  9.4   Total Protein 6.0 - 8.5 g/dL 6.8  6.7  6.7   Total Bilirubin 0.0 - 1.2 mg/dL 0.3  0.3  0.3   Alkaline Phos 44 - 121 IU/L 100  99  98   AST 0 - 40 IU/L 15  14  19    ALT 0 - 32 IU/L 9  10  9     Lipid Panel     Component Value Date/Time   CHOL 168 12/27/2020 0920   TRIG 58 12/27/2020 0920   HDL 65 12/27/2020 0920   CHOLHDL 2.6 12/27/2020 0920   CHOLHDL 4.7 04/03/2008 1115   VLDL 16 04/03/2008 1115   LDLCALC 91 12/27/2020 0920    CBC    Component Value Date/Time   WBC 5.7 12/05/2021 0949   WBC 5.4 06/14/2020 1329   RBC 4.55 12/05/2021 0949   RBC 3.97 06/14/2020 1329   HGB 11.9 12/05/2021 0949   HCT 36.8 12/05/2021 0949   PLT 236 12/05/2021 0949   MCV 81 12/05/2021 0949   MCH 26.2 (L) 12/05/2021 0949   MCH 26.7 06/14/2020 1329   MCHC 32.3 12/05/2021 0949   MCHC 31.1 06/14/2020 1329   RDW 14.2 12/05/2021 0949   LYMPHSABS 0.6 (L) 12/05/2021 0949   MONOABS 0.3 06/14/2020 1329   EOSABS 0.1 12/05/2021 0949   BASOSABS 0.0 12/05/2021 0949    ASSESSMENT AND PLAN: 1. Annual physical exam   2. Essential hypertension Not at goal. Continue amlodipine 10 mg daily.  I recommend adding another medication like HCTZ but patient wants to hold off on that.  She will follow-up with her PCP in 6 weeks for recheck of blood pressure. DASH diet encouraged. - amLODipine (NORVASC) 10 MG tablet; Take 1 tablet (10 mg total) by mouth daily.  Dispense: 90 tablet; Refill: 0 - CBC - Comprehensive metabolic panel  3. Obesity, Class III, BMI 40-49.9 (morbid obesity) Patient advised to eliminate sugary drinks from the diet, cut back on portion sizes especially of white carbohydrates, eat more white lean meat like chicken [place] and seafood instead of beef or pork and incorporate fresh fruits and vegetables into the diet daily. -Encouraged her to increase her exercise several days a week. - Lipid panel - Hemoglobin A1c - Amb ref to Medical Nutrition  Therapy-MNT  4. Primary osteoarthritis of left hip Discussed the importance of weight loss. I will hold off on putting her on Celebrex on meloxicam since she states that NSAIDs increase her blood pressure.  Refill gabapentin - gabapentin (NEURONTIN) 300 MG capsule; Take 1 capsule (300 mg total) by mouth once nightly at bedtime.  Dispense: 90 capsule; Refill: 0 - Ambulatory referral to Orthopedics  5. Lipoma of neck This can be observed.  PCP should recheck this on subsequent visit  6. Encounter for screening mammogram for malignant neoplasm of breast - MM Digital Screening; Future  7. Need for Tdap vaccination Given today.  8. Screening for colon cancer - Ambulatory referral to Gastroenterology  9. Need for dental care Encouraged her to establish care with a dentist so that she can get cleaning twice a year.  10.  History of cervical cancer Advised patient to call and schedule follow-up appointment with Dr. Pricilla Holm.  Last seen 12/2021.  Plan was for follow-up in 6 months.   Message sent to clinical pharmacist to get information from outside pharmacy on shingles vaccine.  Patient was given the opportunity to ask questions.  Patient verbalized understanding of the plan and was able to repeat key elements of the plan.   This documentation was completed using Paediatric nurse.  Any transcriptional errors are unintentional.  No orders of the defined types were placed in this encounter.    Requested Prescriptions   Pending Prescriptions Disp Refills   amLODipine (NORVASC) 10 MG tablet 7 tablet 0    Sig: Take 1 tablet (10 mg total) by mouth daily.   gabapentin (NEURONTIN) 300 MG capsule 90 capsule 0    Sig: Take 1 capsule (300 mg total) by mouth once nightly at bedtime.    No follow-ups on file.  Jonah Blue, MD, FACP

## 2022-09-09 LAB — COMPREHENSIVE METABOLIC PANEL
ALT: 11 IU/L (ref 0–32)
AST: 16 IU/L (ref 0–40)
Albumin/Globulin Ratio: 1.5 (ref 1.2–2.2)
Albumin: 4.1 g/dL (ref 3.9–4.9)
Alkaline Phosphatase: 107 IU/L (ref 44–121)
BUN/Creatinine Ratio: 8 — ABNORMAL LOW (ref 12–28)
BUN: 8 mg/dL (ref 8–27)
Bilirubin Total: 0.3 mg/dL (ref 0.0–1.2)
CO2: 23 mmol/L (ref 20–29)
Calcium: 9.6 mg/dL (ref 8.7–10.3)
Chloride: 102 mmol/L (ref 96–106)
Creatinine, Ser: 0.95 mg/dL (ref 0.57–1.00)
Globulin, Total: 2.7 g/dL (ref 1.5–4.5)
Glucose: 96 mg/dL (ref 70–99)
Potassium: 4.2 mmol/L (ref 3.5–5.2)
Sodium: 142 mmol/L (ref 134–144)
Total Protein: 6.8 g/dL (ref 6.0–8.5)
eGFR: 67 mL/min/{1.73_m2} (ref >59)

## 2022-09-09 LAB — LIPID PANEL
Chol/HDL Ratio: 3 ratio (ref 0.0–4.4)
Cholesterol, Total: 176 mg/dL (ref 100–199)
HDL: 58 mg/dL (ref >39)
LDL Chol Calc (NIH): 101 mg/dL — ABNORMAL HIGH (ref 0–99)
Triglycerides: 96 mg/dL (ref 0–149)
VLDL Cholesterol Cal: 17 mg/dL (ref 5–40)

## 2022-09-09 LAB — CBC
Hematocrit: 38.4 % (ref 34.0–46.6)
Hemoglobin: 12.4 g/dL (ref 11.1–15.9)
MCH: 25.5 pg — ABNORMAL LOW (ref 26.6–33.0)
MCHC: 32.3 g/dL (ref 31.5–35.7)
MCV: 79 fL (ref 79–97)
Platelets: 254 10*3/uL (ref 150–450)
RBC: 4.87 x10E6/uL (ref 3.77–5.28)
RDW: 13.7 % (ref 11.7–15.4)
WBC: 6.7 10*3/uL (ref 3.4–10.8)

## 2022-09-09 LAB — HEMOGLOBIN A1C
Est. average glucose Bld gHb Est-mCnc: 120 mg/dL
Hgb A1c MFr Bld: 5.8 % — ABNORMAL HIGH (ref 4.8–5.6)

## 2022-09-16 ENCOUNTER — Inpatient Hospital Stay: Payer: Medicaid Other | Attending: Gynecologic Oncology | Admitting: Gynecologic Oncology

## 2022-09-16 DIAGNOSIS — C539 Malignant neoplasm of cervix uteri, unspecified: Secondary | ICD-10-CM

## 2022-10-23 ENCOUNTER — Ambulatory Visit: Payer: Medicaid Other | Admitting: Nurse Practitioner

## 2022-11-04 ENCOUNTER — Ambulatory Visit: Payer: Medicaid Other | Admitting: Skilled Nursing Facility1

## 2022-11-06 ENCOUNTER — Ambulatory Visit: Payer: Medicaid Other | Admitting: Physician Assistant

## 2022-12-03 ENCOUNTER — Other Ambulatory Visit: Payer: Self-pay

## 2022-12-03 ENCOUNTER — Other Ambulatory Visit: Payer: Self-pay | Admitting: Internal Medicine

## 2022-12-03 DIAGNOSIS — I10 Essential (primary) hypertension: Secondary | ICD-10-CM

## 2022-12-03 MED ORDER — AMLODIPINE BESYLATE 10 MG PO TABS
10.0000 mg | ORAL_TABLET | Freq: Every day | ORAL | 0 refills | Status: DC
  Filled 2022-12-03: qty 90, 90d supply, fill #0

## 2022-12-05 ENCOUNTER — Other Ambulatory Visit: Payer: Self-pay

## 2022-12-09 ENCOUNTER — Other Ambulatory Visit: Payer: Self-pay

## 2022-12-11 ENCOUNTER — Other Ambulatory Visit: Payer: Self-pay

## 2023-01-05 ENCOUNTER — Telehealth: Payer: Self-pay | Admitting: *Deleted

## 2023-01-05 ENCOUNTER — Ambulatory Visit
Admission: RE | Admit: 2023-01-05 | Discharge: 2023-01-05 | Disposition: A | Discharge status: Home / Self Care | Payer: Medicaid Other | Source: Ambulatory Visit | Attending: Radiation Oncology | Admitting: Radiation Oncology

## 2023-01-05 NOTE — Telephone Encounter (Signed)
CALLED PATIENT TO ASK ABOUT RESCHEDULING MISSED FU FOR 01-05-23, LVM FOR A RETURN CALL

## 2023-01-24 ENCOUNTER — Ambulatory Visit (HOSPITAL_COMMUNITY)
Admission: EM | Admit: 2023-01-24 | Discharge: 2023-01-24 | Disposition: A | Discharge status: Home / Self Care | Payer: Medicaid Other | Attending: Family Medicine | Admitting: Family Medicine

## 2023-01-24 ENCOUNTER — Encounter (HOSPITAL_COMMUNITY): Payer: Self-pay

## 2023-01-24 DIAGNOSIS — K0889 Other specified disorders of teeth and supporting structures: Secondary | ICD-10-CM

## 2023-01-24 MED ORDER — HYDROCODONE-ACETAMINOPHEN 5-325 MG PO TABS
ORAL_TABLET | ORAL | Status: AC
  Filled 2023-01-24: qty 1

## 2023-01-24 MED ORDER — AMOXICILLIN-POT CLAVULANATE 875-125 MG PO TABS: 1.0000 | ORAL_TABLET | Freq: Two times a day (BID) | ORAL | 0 refills | Status: DC

## 2023-01-24 MED ORDER — HYDROCODONE-ACETAMINOPHEN 5-325 MG PO TABS: 1.0000 | ORAL_TABLET | Freq: Four times a day (QID) | ORAL | 0 refills | Status: DC | PRN

## 2023-01-24 MED ORDER — HYDROCODONE-ACETAMINOPHEN 5-325 MG PO TABS
1.0000 | ORAL_TABLET | Freq: Once | ORAL | Status: AC
  Administered 2023-01-24: 1 via ORAL

## 2023-01-24 NOTE — Discharge Instructions (Signed)
Be aware, you have been prescribed pain medications that may cause drowsiness. While taking this medication, do not take any other medications containing acetaminophen (Tylenol). Do not combine with alcohol or recreational drugs. Please do not drive, operate heavy machinery, or take part in activities that require making important decisions while on this medication as your judgement may be clouded.  

## 2023-01-24 NOTE — ED Triage Notes (Signed)
Presents with left ear pain and dental pain x 1 day.

## 2023-01-28 NOTE — ED Provider Notes (Signed)
[facility]   [id number] 01/24/23 Arrival Time: 1326  ASSESSMENT & PLAN:  1. Pain, dental    No sign of abscess requiring I&D at this time. Discussed.  Meds ordered this encounter  Medications   amoxicillin-clavulanate (AUGMENTIN) 875-125 MG tablet    Sig: Take 1 tablet by mouth every 12 (twelve) hours.    Dispense:  20 tablet    Refill:  0   HYDROcodone-acetaminophen (NORCO/VICODIN) 5-325 MG tablet    Sig: Take 1 tablet by mouth every 6 (six) hours as needed for moderate pain or severe pain.    Dispense:  8 tablet    Refill:  0   HYDROcodone-acetaminophen (NORCO/VICODIN) 5-325 MG per tablet 1 tablet    [place] Controlled Substances Registry consulted for this patient. I feel the risk/benefit ratio today is favorable for proceeding with this prescription for a controlled substance. Medication sedation precautions given.  Dental resource written instructions given. She will schedule dental evaluation as soon as possible if not improving over the next 24-48 hours.  Reviewed expectations re: course of current medical issues. Questions answered. Outlined signs and symptoms indicating need for more acute intervention. Patient verbalized understanding. After Visit Summary given.   SUBJECTIVE:  Anne Harris is a 62 y.o. female who reports left ear pain and left lower frontal dental pain x 1 day; aching/throbbing. Tolerating PO. No tx PTA.   OBJECTIVE: Vitals:   01/24/23 1516  BP: (!) 150/81  Pulse: 93  Resp: 16  Temp: 98.1 F (36.7 C)  TempSrc: Oral  SpO2: 98%    General appearance: alert; no distress HENT: normocephalic; atraumatic; dentition: fair; left lower gums without areas of fluctuance, drainage, or bleeding and with tenderness to palpation; normal jaw movement without difficulty Neck: supple without LAD; FROM; trachea midline Lungs: normal respirations; unlabored; speaks full sentences without difficulty Skin: warm and dry Psychological: alert and  cooperative; normal mood and affect  Allergies  Allergen Reactions   Nsaids     Not recommended per oncology due to HTN and potential for ARI    Past Medical History:  Diagnosis Date   Abnormal Pap smear of cervix    Anemia    Cervical cancer [facility]) oncologist-- dr Bertis Ruddy and dr Roselind Messier   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 Roselind Messier   History of radiation therapy 12/28/2019-02/09/2020   Cervix   Dr Antony Blackbird   Hypertension    followed by pcp   Mediastinal adenopathy    Obesity, Class III, BMI 40-49.9 (morbid obesity) (HCC)    Pancytopenia, acquired (HCC)    Pulmonary nodules    bilateral   Wears glasses    Social History   Socioeconomic History   Marital status: Single    Spouse name: Not on file   Number of children: 6   Years of education: Not on file   Highest education level: High school graduate  Occupational History   Occupation: care giver  Tobacco Use   Smoking status: Never   Smokeless tobacco: Never  Vaping Use   Vaping status: Never Used  Substance and Sexual Activity   Alcohol use: Yes    Comment: social   Drug use: Never   Sexual activity: Not Currently  Other Topics Concern   Not on file  Social History Narrative   Not on file  Social Determinants of Health   Financial Resource Strain: Not on file  Food Insecurity: Not on file  Transportation Needs: No Transportation Needs (11/10/2019)   PRAPARE - Administrator, Civil Service (Medical): No    Lack of Transportation (Non-Medical): No  Physical Activity: Not on file  Stress: Not on file  Social Connections: Not on file  Intimate Partner Violence: Not on file   Family History  Problem Relation Age of Onset   Colon cancer Mother 55   Ovarian cancer Neg Hx    Uterine cancer Neg Hx    Breast  cancer Neg Hx    Cervical cancer Neg Hx    Past Surgical History:  Procedure Laterality Date   BRONCHIAL BIOPSY  12/15/2019   Procedure: BRONCHIAL BIOPSIES;  Surgeon: Josephine Igo, DO;  [facility];  Service: Pulmonary;;   BRONCHIAL NEEDLE ASPIRATION BIOPSY  12/15/2019   Procedure: BRONCHIAL NEEDLE ASPIRATION BIOPSIES;  Surgeon: Josephine Igo, DO;  [facility];  Service: Pulmonary;;   BRONCHIAL WASHINGS  12/15/2019   Procedure: BRONCHIAL WASHINGS;  Surgeon: Josephine Igo, DO;  [facility];  Service: Pulmonary;;   CERVICAL BIOPSY     ENDOBRONCHIAL ULTRASOUND N/A 12/15/2019   Procedure: ENDOBRONCHIAL ULTRASOUND;  Surgeon: Josephine Igo, DO;  [facility];  Service: Pulmonary;  Laterality: N/A;   IR IMAGING GUIDED PORT INSERTION  12/22/2019   IR REMOVAL TUN ACCESS W/ PORT W/O FL MOD SED  06/14/2020   OPERATIVE ULTRASOUND N/A 02/27/2020   Procedure: OPERATIVE ULTRASOUND;  Surgeon: Antony Blackbird, MD;  Location: [facility];  Service: Urology;  Laterality: N/A;   OPERATIVE ULTRASOUND N/A 03/05/2020   Procedure: OPERATIVE ULTRASOUND;  Surgeon: Antony Blackbird, MD;  Location: [facility];  Service: Urology;  Laterality: N/A;   OPERATIVE ULTRASOUND N/A 03/14/2020   Procedure: OPERATIVE ULTRASOUND;  Surgeon: Antony Blackbird, MD;  Location: [facility];  Service: Urology;  Laterality: N/A;   OPERATIVE ULTRASOUND N/A 03/23/2020   Procedure: OPERATIVE ULTRASOUND;  Surgeon: Antony Blackbird, MD;  Location: [facility];  Service: Urology;  Laterality: N/A;   OPERATIVE ULTRASOUND N/A 03/27/2020   Procedure: OPERATIVE ULTRASOUND;  Surgeon: Antony Blackbird, MD;  Location: [facility];  Service: Urology;  Laterality: N/A;   TANDEM RING INSERTION N/A 02/27/2020   Procedure: TANDEM RING INSERTION;  Surgeon: Antony Blackbird, MD;  Location: [facility];  Service: Urology;  Laterality:  N/A;   TANDEM RING INSERTION N/A 03/05/2020   Procedure: TANDEM RING INSERTION;  Surgeon: Antony Blackbird, MD;  Location: [facility];  Service: Urology;  Laterality: N/A;   TANDEM RING INSERTION N/A 03/14/2020   Procedure: TANDEM RING INSERTION FOR HIGH DOSE RADIATION THERAPY;  Surgeon: Antony Blackbird, MD;  Location: [facility];  Service: Urology;  Laterality: N/A;   TANDEM RING INSERTION N/A 03/23/2020   Procedure: TANDEM RING INSERTION;  Surgeon: Antony Blackbird, MD;  Location: [facility];  Service: Urology;  Laterality: N/A;   TANDEM RING INSERTION N/A 03/27/2020   Procedure: TANDEM RING INSERTION;  Surgeon: Antony Blackbird, MD;  Location: [facility];  Service: Urology;  Laterality: N/A;  ULTRASOUND AND TECH      Mardella Layman, MD 01/28/23 0930

## 2023-01-29 ENCOUNTER — Emergency Department (HOSPITAL_COMMUNITY): Payer: Medicaid Other

## 2023-01-29 ENCOUNTER — Other Ambulatory Visit: Payer: Self-pay

## 2023-01-29 ENCOUNTER — Encounter (HOSPITAL_COMMUNITY): Payer: Self-pay

## 2023-01-29 ENCOUNTER — Emergency Department (HOSPITAL_COMMUNITY)
Admission: EM | Admit: 2023-01-29 | Discharge: 2023-01-29 | Disposition: A | Discharge status: Home / Self Care | Payer: Medicaid Other

## 2023-01-29 DIAGNOSIS — K029 Dental caries, unspecified: Secondary | ICD-10-CM | POA: Diagnosis not present

## 2023-01-29 DIAGNOSIS — K0889 Other specified disorders of teeth and supporting structures: Secondary | ICD-10-CM | POA: Diagnosis present

## 2023-01-29 LAB — CBC WITH DIFFERENTIAL/PLATELET
Abs Immature Granulocytes: 0.02 10*3/uL (ref 0.00–0.07)
Basophils Absolute: 0 10*3/uL (ref 0.0–0.1)
Basophils Relative: 1 %
Eosinophils Absolute: 0.1 10*3/uL (ref 0.0–0.5)
Eosinophils Relative: 2 %
HCT: 40 % (ref 36.0–46.0)
Hemoglobin: 12.5 g/dL (ref 12.0–15.0)
Immature Granulocytes: 0 %
Lymphocytes Relative: 13 %
Lymphs Abs: 0.8 10*3/uL (ref 0.7–4.0)
MCH: 25.6 pg — ABNORMAL LOW (ref 26.0–34.0)
MCHC: 31.3 g/dL (ref 30.0–36.0)
MCV: 81.8 fL (ref 80.0–100.0)
Monocytes Absolute: 0.3 10*3/uL (ref 0.1–1.0)
Monocytes Relative: 4 %
Neutro Abs: 5.3 10*3/uL (ref 1.7–7.7)
Neutrophils Relative %: 80 %
Platelets: 276 10*3/uL (ref 150–400)
RBC: 4.89 MIL/uL (ref 3.87–5.11)
RDW: 14 % (ref 11.5–15.5)
WBC: 6.6 10*3/uL (ref 4.0–10.5)
nRBC: 0 % (ref 0.0–0.2)

## 2023-01-29 LAB — BASIC METABOLIC PANEL
Anion gap: 11 (ref 5–15)
BUN: <5 mg/dL — ABNORMAL LOW (ref 8–23)
CO2: 24 mmol/L (ref 22–32)
Calcium: 9 mg/dL (ref 8.9–10.3)
Chloride: 104 mmol/L (ref 98–111)
Creatinine, Ser: 0.9 mg/dL (ref 0.44–1.00)
GFR, Estimated: >60 mL/min (ref >60)
Glucose, Bld: 103 mg/dL — ABNORMAL HIGH (ref 70–99)
Potassium: 3.3 mmol/L — ABNORMAL LOW (ref 3.5–5.1)
Sodium: 139 mmol/L (ref 135–145)

## 2023-01-29 MED ORDER — ACETAMINOPHEN 325 MG PO TABS
650.0000 mg | ORAL_TABLET | Freq: Once | ORAL | Status: DC | PRN
  Filled 2023-01-29: qty 2

## 2023-01-29 NOTE — Discharge Instructions (Signed)
Please continue to take antibiotics.  Follow-up with dentist for your dental pain.  Also, please follow-up with your primary doctor.  You may take over-the-counter Tylenol alternating with Motrin for pain.

## 2023-01-29 NOTE — ED Provider Notes (Signed)
Anne Harris Provider Note   CSN: [id number] Arrival date & time: 01/29/23  1103     History  Chief Complaint  Patient presents with   Dental Pain    Anne Harris is a 62 y.o. female.  C63-year-old female present emergency department with left upper dental pain.  Reports symptoms started Saturday went to urgent care and was prescribed Augmentin, this is day 5 of antibiotics for her.  She reports that her dental pain has not improved.  She also notes that she has developed some dyspnea on exertion over the same duration; also notes that she has had upper viral URI symptoms as well.   Dental Pain      Home Medications Prior to Admission medications   Medication Sig Start Date End Date Taking? Authorizing Provider  amLODipine (NORVASC) 10 MG tablet Take 1 tablet (10 mg total) by mouth daily. 12/03/22   Hoy Register, MD  amoxicillin-clavulanate (AUGMENTIN) 875-125 MG tablet Take 1 tablet by mouth every 12 (twelve) hours. 01/24/23   Mardella Layman, MD  Blood Pressure Monitor DEVI Please provide patient with insurance approved blood pressure monitor 07/01/21   Claiborne Rigg, NP  gabapentin (NEURONTIN) 300 MG capsule Take 1 capsule (300 mg total) by mouth once nightly at bedtime. 09/08/22   Marcine Matar, MD  HYDROcodone-acetaminophen (NORCO/VICODIN) 5-325 MG tablet Take 1 tablet by mouth every 6 (six) hours as needed for moderate pain or severe pain. 01/24/23   Mardella Layman, MD  prochlorperazine (COMPAZINE) 10 MG tablet Take 1 tablet (10 mg total) by mouth every 6 (six) hours as needed (Nausea or vomiting). 12/14/19 05/22/20  Artis Delay, MD      Allergies    Nsaids    Review of Systems   Review of Systems  Physical Exam Updated Vital Signs BP (!) 151/83   Pulse 85   Temp 98.1 F (36.7 C) (Oral)   Resp 17   Ht 5\' 5"  (1.651 m)   Wt 118.4 kg   SpO2 100%   BMI 43.43 kg/m  Physical Exam Vitals and nursing note reviewed.   Constitutional:      General: She is not in acute distress.    Appearance: She is not toxic-appearing.  HENT:     Head: Normocephalic.     Nose: Nose normal.     Mouth/Throat:     Mouth: Mucous membranes are moist.     Comments: Signs of dental caries.  No obvious periapical abscess.  Uvula midline.  No tonsillar erythema or swelling.  Floor mouth is soft. Eyes:     Conjunctiva/sclera: Conjunctivae normal.  Cardiovascular:     Rate and Rhythm: Normal rate and regular rhythm.  Pulmonary:     Effort: Pulmonary effort is normal. No respiratory distress.     Breath sounds: No wheezing, rhonchi or rales.  Abdominal:     General: Abdomen is flat. There is no distension.     Palpations: Abdomen is soft.     Tenderness: There is no abdominal tenderness. There is no guarding or rebound.  Skin:    General: Skin is warm and dry.     Capillary Refill: Capillary refill takes less than 2 seconds.  Neurological:     Mental Status: She is alert and oriented to person, place, and time.  Psychiatric:        Mood and Affect: Mood normal.        Behavior: Behavior normal.  ED Results / Procedures / Treatments   Labs (all labs ordered are listed, but only abnormal results are displayed) Labs Reviewed  CBC WITH DIFFERENTIAL/PLATELET - Abnormal; Notable for the following components:      Result Value   MCH 25.6 (*)    All other components within normal limits  BASIC METABOLIC PANEL - Abnormal; Notable for the following components:   Potassium 3.3 (*)    Glucose, Bld 103 (*)    BUN <5 (*)    All other components within normal limits    EKG None  Radiology DG Chest 2 View  Result Date: 01/29/2023 CLINICAL DATA:  Cough and shortness of breath. History of dental infection treated with antibiotics. EXAM: CHEST - 2 VIEW COMPARISON:  03/11/2017 FINDINGS: The heart size and mediastinal contours are within normal limits. Both lungs are clear. The visualized skeletal structures are  unremarkable. IMPRESSION: No active cardiopulmonary disease. Electronically Signed   By: Burman Nieves M.D.   On: 01/29/2023 20:29    Procedures Procedures    Medications Ordered in ED Medications  acetaminophen (TYLENOL) tablet 650 mg (650 mg Oral Patient Refused/Not Given 01/29/23 2204)    ED Course/ Medical Decision Making/ A&P Clinical Course as of 01/30/23 0016  Thu Jan 29, 2023  2034 DG Chest 2 View IMPRESSION: No active cardiopulmonary disease.   [TY]    Clinical Course User Index [TY] Coral Spikes, DO                                 Medical Decision Making Well-appearing 62 year old female present emergency department dental pain and some shortness of breath.  No evidence of angioedema, PTA or retropharyngeal abscess.  Continue taking antibiotics.  In the setting of her viral URI chest x-ray to evaluate for pneumonia; it was negative.  She is saturating 100% on room air, does not appear to be in any sign of respiratory distress.  Lungs are clear to auscultation.  Her triage labs with border Hypokalemia.  Normal kidney function.  She has no leukocytosis, fever or tachycardia to suggest systemic infection.  She is overall well-appearing.  Discussed follow-up with dentist.  Stable for discharge at this time.  Amount and/or Complexity of Data Reviewed Radiology: ordered. Decision-making details documented in ED Course.  Risk OTC drugs. Decision regarding hospitalization. Diagnosis or treatment significantly limited by social determinants of health. Risk Details: Poor health literacy.  Given patient stable vitals, negative chest x-ray and labs she is unlikely to benefit from acute hospitalization.         Final Clinical Impression(s) / ED Diagnoses Final diagnoses:  Pain due to dental caries    Rx / DC Orders ED Discharge Orders     None         Coral Spikes, DO 01/30/23 0016

## 2023-01-29 NOTE — ED Triage Notes (Signed)
Was seen at [facility] for dental infection and prescribed augmentin.  Reports not getting better and now feels short of breath with walking.

## 2023-01-29 NOTE — ED Notes (Signed)
Pt provided discharge instructions and prescription information. Pt was given the opportunity to ask questions and questions were answered.   

## 2023-03-04 ENCOUNTER — Other Ambulatory Visit: Payer: Self-pay | Admitting: Family Medicine

## 2023-03-04 DIAGNOSIS — I10 Essential (primary) hypertension: Secondary | ICD-10-CM

## 2023-03-05 ENCOUNTER — Other Ambulatory Visit: Payer: Self-pay

## 2023-03-05 MED ORDER — AMLODIPINE BESYLATE 10 MG PO TABS
10.0000 mg | ORAL_TABLET | Freq: Every day | ORAL | 0 refills | Status: DC
  Filled 2023-03-05: qty 90, 90d supply, fill #0

## 2023-03-05 NOTE — Telephone Encounter (Signed)
Requested Prescriptions  Pending Prescriptions Disp Refills   amLODipine (NORVASC) 10 MG tablet 90 tablet 0    Sig: Take 1 tablet (10 mg total) by mouth daily.     Cardiovascular: Calcium Channel Blockers 2 Failed - 03/04/2023 12:05 PM      Failed - Last BP in normal range    BP Readings from Last 1 Encounters:  01/29/23 (!) 151/83         Passed - Last Heart Rate in normal range    Pulse Readings from Last 1 Encounters:  01/29/23 85         Passed - Valid encounter within last 6 months    Recent Outpatient Visits           5 months ago Annual physical exam   [facility] Marcine Matar, MD   1 year ago Prediabetes   [facility] [place], [place], [place]   1 year ago Primary hypertension   [place] [facility] [place], Shea Stakes, NP   1 year ago Acute pain of right knee   [facility] [place], Shea Stakes, NP   2 years ago Essential hypertension   [facility] [place], [place], [place]

## 2023-03-10 ENCOUNTER — Other Ambulatory Visit: Payer: Self-pay

## 2023-03-10 ENCOUNTER — Emergency Department (HOSPITAL_COMMUNITY)
Admission: EM | Admit: 2023-03-10 | Discharge: 2023-03-10 | Disposition: A | Discharge status: Home / Self Care | Payer: Medicaid Other | Attending: Emergency Medicine | Admitting: Emergency Medicine

## 2023-03-10 ENCOUNTER — Encounter (HOSPITAL_COMMUNITY): Payer: Self-pay

## 2023-03-10 ENCOUNTER — Emergency Department (HOSPITAL_COMMUNITY): Payer: Medicaid Other

## 2023-03-10 DIAGNOSIS — N10 Acute tubulo-interstitial nephritis: Secondary | ICD-10-CM | POA: Insufficient documentation

## 2023-03-10 DIAGNOSIS — Z79899 Other long term (current) drug therapy: Secondary | ICD-10-CM | POA: Diagnosis not present

## 2023-03-10 DIAGNOSIS — R109 Unspecified abdominal pain: Secondary | ICD-10-CM | POA: Diagnosis present

## 2023-03-10 DIAGNOSIS — R5381 Other malaise: Secondary | ICD-10-CM | POA: Insufficient documentation

## 2023-03-10 DIAGNOSIS — I10 Essential (primary) hypertension: Secondary | ICD-10-CM | POA: Insufficient documentation

## 2023-03-10 LAB — BASIC METABOLIC PANEL
Anion gap: 9 (ref 5–15)
BUN: 7 mg/dL — ABNORMAL LOW (ref 8–23)
CO2: 26 mmol/L (ref 22–32)
Calcium: 8.8 mg/dL — ABNORMAL LOW (ref 8.9–10.3)
Chloride: 106 mmol/L (ref 98–111)
Creatinine, Ser: 0.82 mg/dL (ref 0.44–1.00)
GFR, Estimated: >60 mL/min (ref >60)
Glucose, Bld: 104 mg/dL — ABNORMAL HIGH (ref 70–99)
Potassium: 3.2 mmol/L — ABNORMAL LOW (ref 3.5–5.1)
Sodium: 141 mmol/L (ref 135–145)

## 2023-03-10 LAB — URINALYSIS, ROUTINE W REFLEX MICROSCOPIC
Bilirubin Urine: NEGATIVE
Glucose, UA: NEGATIVE mg/dL
Ketones, ur: NEGATIVE mg/dL
Nitrite: NEGATIVE
Protein, ur: NEGATIVE mg/dL
Specific Gravity, Urine: 1.003 — ABNORMAL LOW (ref 1.005–1.030)
pH: 7 (ref 5.0–8.0)

## 2023-03-10 LAB — CBC
HCT: 38.2 % (ref 36.0–46.0)
Hemoglobin: 11.8 g/dL — ABNORMAL LOW (ref 12.0–15.0)
MCH: 25.7 pg — ABNORMAL LOW (ref 26.0–34.0)
MCHC: 30.9 g/dL (ref 30.0–36.0)
MCV: 83.2 fL (ref 80.0–100.0)
Platelets: 252 10*3/uL (ref 150–400)
RBC: 4.59 MIL/uL (ref 3.87–5.11)
RDW: 14.5 % (ref 11.5–15.5)
WBC: 5.3 10*3/uL (ref 4.0–10.5)
nRBC: 0 % (ref 0.0–0.2)

## 2023-03-10 MED ORDER — ONDANSETRON 8 MG PO TBDP
8.0000 mg | ORAL_TABLET | Freq: Three times a day (TID) | ORAL | 0 refills | Status: DC | PRN
  Filled 2023-03-10: qty 20, 7d supply, fill #0

## 2023-03-10 MED ORDER — ONDANSETRON 8 MG PO TBDP
8.0000 mg | ORAL_TABLET | Freq: Three times a day (TID) | ORAL | 0 refills | Status: DC | PRN
Start: 2023-03-10 — End: 2023-04-20

## 2023-03-10 MED ORDER — PHENAZOPYRIDINE HCL 200 MG PO TABS: 200.0000 mg | ORAL_TABLET | Freq: Three times a day (TID) | ORAL | 0 refills | Status: DC

## 2023-03-10 MED ORDER — CEPHALEXIN 500 MG PO CAPS
500.0000 mg | ORAL_CAPSULE | Freq: Three times a day (TID) | ORAL | 0 refills | Status: DC
  Filled 2023-03-10: qty 30, 10d supply, fill #0

## 2023-03-10 MED ORDER — PHENAZOPYRIDINE HCL 200 MG PO TABS
200.0000 mg | ORAL_TABLET | Freq: Three times a day (TID) | ORAL | 0 refills | Status: DC
  Filled 2023-03-10: qty 6, 2d supply, fill #0

## 2023-03-10 MED ORDER — CEPHALEXIN 500 MG PO CAPS: 500.0000 mg | ORAL_CAPSULE | Freq: Three times a day (TID) | ORAL | 0 refills | Status: DC

## 2023-03-10 MED ORDER — CEFTRIAXONE SODIUM 1 G IJ SOLR
500.0000 mg | Freq: Once | INTRAMUSCULAR | Status: AC
  Administered 2023-03-10: 500 mg via INTRAMUSCULAR
  Filled 2023-03-10: qty 10

## 2023-03-10 MED ORDER — STERILE WATER FOR INJECTION IJ SOLN
INTRAMUSCULAR | Status: AC
  Filled 2023-03-10: qty 10

## 2023-03-10 NOTE — ED Triage Notes (Addendum)
Patient has had left sided flank pain for 2 days along with burning urination. Has not taken her blood pressure medication today. Elevated in triage.

## 2023-03-10 NOTE — ED Provider Notes (Signed)
EMERGENCY DEPARTMENT AT [facility] Provider Note   CSN: [id number] Arrival date & time: 03/10/23  4696     History  Chief Complaint  Patient presents with   Flank Pain    Anne Harris is a 62 y.o. female.  HPI    61 year old female comes in with chief complaint of flank pain.  Patient has history of hypertension.  Patient states that she started having burning with urination about 2 or 3 days ago.  Yesterday she started having flank pain.  She is having malaise.  She does not think she is having fevers.  Review of system is positive for nausea without vomiting. Home Medications Prior to Admission medications   Medication Sig Start Date End Date Taking? Authorizing Provider  cephALEXin (KEFLEX) 500 MG capsule Take 1 capsule (500 mg total) by mouth 3 (three) times daily. 03/10/23  Yes Yancy Hascall, MD  ondansetron (ZOFRAN-ODT) 8 MG disintegrating tablet Take 1 tablet (8 mg total) by mouth every 8 (eight) hours as needed for nausea. 03/10/23  Yes Derwood Kaplan, MD  phenazopyridine (PYRIDIUM) 200 MG tablet Take 1 tablet (200 mg total) by mouth 3 (three) times daily. 03/10/23  Yes Liddie Chichester, MD  amLODipine (NORVASC) 10 MG tablet Take 1 tablet (10 mg total) by mouth daily. 03/05/23   Claiborne Rigg, NP  amoxicillin-clavulanate (AUGMENTIN) 875-125 MG tablet Take 1 tablet by mouth every 12 (twelve) hours. 01/24/23   Mardella Layman, MD  Blood Pressure Monitor DEVI Please provide patient with insurance approved blood pressure monitor 07/01/21   Claiborne Rigg, NP  gabapentin (NEURONTIN) 300 MG capsule Take 1 capsule (300 mg total) by mouth once nightly at bedtime. 09/08/22   Marcine Matar, MD  HYDROcodone-acetaminophen (NORCO/VICODIN) 5-325 MG tablet Take 1 tablet by mouth every 6 (six) hours as needed for moderate pain or severe pain. 01/24/23   Mardella Layman, MD  prochlorperazine (COMPAZINE) 10 MG tablet Take 1 tablet (10 mg total) by mouth every 6 (six)  hours as needed (Nausea or vomiting). 12/14/19 05/22/20  Artis Delay, MD      Allergies    Nsaids    Review of Systems   Review of Systems  All other systems reviewed and are negative.   Physical Exam Updated Vital Signs BP (!) 169/93   Pulse 69   Temp 97.7 F (36.5 C)   Resp 18   Ht 5\' 5"  (1.651 m)   Wt 129.7 kg   SpO2 100%   BMI 47.59 kg/m  Physical Exam Vitals and nursing note reviewed.  Constitutional:      Appearance: She is well-developed.  HENT:     Head: Atraumatic.  Cardiovascular:     Rate and Rhythm: Normal rate.  Pulmonary:     Effort: Pulmonary effort is normal.  Abdominal:     Comments: Patient has tenderness over the left flank region  Musculoskeletal:     Cervical back: Normal range of motion and neck supple.  Skin:    General: Skin is warm and dry.  Neurological:     Mental Status: She is alert and oriented to person, place, and time.     ED Results / Procedures / Treatments   Labs (all labs ordered are listed, but only abnormal results are displayed) Labs Reviewed  URINALYSIS, ROUTINE W REFLEX MICROSCOPIC - Abnormal; Notable for the following components:      Result Value   Color, Urine COLORLESS (*)    Specific Gravity, Urine 1.003 (*)  Hgb urine dipstick LARGE (*)    Leukocytes,Ua TRACE (*)    Bacteria, UA RARE (*)    All other components within normal limits  BASIC METABOLIC PANEL - Abnormal; Notable for the following components:   Potassium 3.2 (*)    Glucose, Bld 104 (*)    BUN 7 (*)    Calcium 8.8 (*)    All other components within normal limits  CBC - Abnormal; Notable for the following components:   Hemoglobin 11.8 (*)    MCH 25.7 (*)    All other components within normal limits  URINE CULTURE    EKG None  Radiology CT Renal Stone Study  Result Date: 03/10/2023 CLINICAL DATA:  Left-sided flank pain for 2 days.  Dysuria. EXAM: CT ABDOMEN AND PELVIS WITHOUT CONTRAST TECHNIQUE: Multidetector CT imaging of the abdomen  and pelvis was performed following the standard protocol without IV contrast. RADIATION DOSE REDUCTION: This exam was performed according to the departmental dose-optimization program which includes automated exposure control, adjustment of the mA and/or kV according to patient size and/or use of iterative reconstruction technique. COMPARISON:  01/01/2020 FINDINGS: Lower chest: No acute findings. Few tiny bibasilar pulmonary nodules remain stable, consistent with benign etiology. Hepatobiliary: No mass visualized on this unenhanced exam. 1.5 cm calcified gallstone noted. No signs of cholecystitis or biliary ductal dilatation. Pancreas: No mass or inflammatory process visualized on this unenhanced exam. Spleen:  Within normal limits in size. Adrenals/Urinary tract: Mild right renal parenchymal scarring and atrophy noted. 1 mm calculus in lower pole of left kidney. No evidence of ureteral calculi or hydronephrosis. Unremarkable unopacified urinary bladder. Stomach/Bowel: Small hiatal hernia. No evidence of obstruction, inflammatory process, or abnormal fluid collections. Normal appendix visualized. Vascular/Lymphatic: No pathologically enlarged lymph nodes identified. No evidence of abdominal aortic aneurysm. Reproductive:  No mass or other significant abnormality. Other:  None. Musculoskeletal:  No suspicious bone lesions identified. IMPRESSION: Tiny nonobstructing left renal calculus. No evidence of ureteral calculi, hydronephrosis, or other acute findings. Cholelithiasis. No radiographic evidence of cholecystitis. Small hiatal hernia. Electronically Signed   By: Danae Orleans M.D.   On: 03/10/2023 09:58    Procedures Procedures    Medications Ordered in ED Medications  cefTRIAXone (ROCEPHIN) injection 500 mg (500 mg Intramuscular Given 03/10/23 1408)  sterile water (preservative free) injection (  Given 03/10/23 1409)    ED Course/ Medical Decision Making/ A&P                                 Medical  Decision Making Amount and/or Complexity of Data Reviewed Labs: ordered.  Risk Prescription drug management.  62 year old female comes in with chief complaint of left-sided flank pain along with burning with urination.  She has a history of hypertension.  Patient has previous history of squamous cell carcinoma of the cervix.  Patient currently is in remission.  She denies any gross hematuria.  Differential diagnosis considered for this patient includes acute cystitis, pyelonephritis, kidney stone, recurrence of the cancer.  CT renal stone was ordered in triage.  CT is negative for acute findings.   I have reviewed patient's previous workup including outpatient oncology visits from 2 years ago.  Patient's labs reveal urine that has markers positive for infection.  I informed patient that clinically it seems like she is likely having pyelonephritis.  We will start her on antibiotics and have her follow-up with her PCP.  If however her symptoms get  worse, or she starts having new symptoms then she needs to return to the ER or see her PCP.  Final Clinical Impression(s) / ED Diagnoses Final diagnoses:  Acute pyelonephritis    Rx / DC Orders ED Discharge Orders          Ordered    cephALEXin (KEFLEX) 500 MG capsule  3 times daily        03/10/23 1427    ondansetron (ZOFRAN-ODT) 8 MG disintegrating tablet  Every 8 hours PRN        03/10/23 1427    phenazopyridine (PYRIDIUM) 200 MG tablet  3 times daily        03/10/23 1427              Derwood Kaplan, MD 03/10/23 1656

## 2023-03-10 NOTE — Discharge Instructions (Addendum)
We saw you in the ER for flank pain and burning with urination. We suspect kidney infection.  Please return to the ER if your symptoms worsen; you have increased pain, fevers, chills, inability to keep any medications down, confusion. Otherwise see the outpatient doctor as requested.

## 2023-03-11 LAB — URINE CULTURE

## 2023-03-12 ENCOUNTER — Other Ambulatory Visit: Payer: Self-pay

## 2023-03-16 ENCOUNTER — Other Ambulatory Visit: Payer: Self-pay

## 2023-04-02 HISTORY — PX: MOUTH SURGERY: SHX715

## 2023-04-20 ENCOUNTER — Encounter: Payer: Self-pay | Admitting: Nurse Practitioner

## 2023-04-20 ENCOUNTER — Other Ambulatory Visit: Payer: Self-pay

## 2023-04-20 ENCOUNTER — Ambulatory Visit: Payer: Medicaid Other | Attending: Nurse Practitioner | Admitting: Nurse Practitioner

## 2023-04-20 VITALS — BP 148/83 | HR 92 | Ht 65.0 in | Wt 260.8 lb

## 2023-04-20 DIAGNOSIS — I10 Essential (primary) hypertension: Secondary | ICD-10-CM

## 2023-04-20 MED ORDER — LOSARTAN POTASSIUM 25 MG PO TABS
25.0000 mg | ORAL_TABLET | Freq: Every day | ORAL | 1 refills | Status: DC
  Filled 2023-04-20: qty 90, 90d supply, fill #0
  Filled 2023-07-14: qty 90, 90d supply, fill #1

## 2023-04-20 NOTE — Progress Notes (Signed)
Assessment & Plan:  Anne Harris was seen today for blood pressure check.  Diagnoses and all orders for this visit:  Primary hypertension -     losartan (COZAAR) 25 MG tablet; Take 1 tablet (25 mg total) by mouth daily. Continue amlodipine and start losartan as prescribed.  Reminded to bring in blood pressure log for follow  up appointment.  RECOMMENDATIONS: DASH/Mediterranean Diets are healthier choices for HTN.    Low calcium levels -     VITAMIN D 25 Hydroxy (Vit-D Deficiency, Fractures)    Patient has been counseled on age-appropriate routine health concerns for screening and prevention. These are reviewed and up-to-date. Referrals have been placed accordingly. Immunizations are up-to-date or declined.    Subjective:   Chief Complaint  Patient presents with   Blood Pressure Check         Anne Harris 62 y.o. female presents to office today for follow up to HTN.   She has a past medical history of Abnormal Pap smear of cervix, Anemia, Cervical cancer (oncologist-- dr Bertis Ruddy and dr Roselind Messier), History of radiation therapy (02/27/2020-03/27/2020), History of radiation therapy (12/28/2019-02/09/2020), Hypertension, Mediastinal adenopathy, Obesity, Class III, BMI 40-49.9, Pancytopenia, acquired, Pulmonary nodules   She states she received her Flu and COVID booster vaccine last month (03-06-2023) PAP smear is overdue: She has been instructed to follow up with GYN ONC MAMMOGRAM: Overdue. She is aware she is to call the the breast center to schedule.     HTN  Blood pressure is elevated. She reports similar readings at home. Currently taking amlodipine 10 mg daily.  BP Readings from Last 3 Encounters:  04/20/23 (!) 148/83  03/10/23 (!) 169/93  01/29/23 (!) 151/83     Review of Systems  Constitutional:  Negative for fever, malaise/fatigue and weight loss.  HENT: Negative.  Negative for nosebleeds.   Eyes: Negative.  Negative for blurred vision, double vision and photophobia.   Respiratory: Negative.  Negative for cough and shortness of breath.   Cardiovascular: Negative.  Negative for chest pain, palpitations and leg swelling.  Gastrointestinal: Negative.  Negative for heartburn, nausea and vomiting.  Musculoskeletal: Negative.  Negative for myalgias.  Neurological: Negative.  Negative for dizziness, focal weakness, seizures and headaches.  Psychiatric/Behavioral: Negative.  Negative for suicidal ideas.     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 Roselind Messier   History of radiation therapy 12/28/2019-02/09/2020   Cervix   Dr Antony Blackbird   Hypertension    followed by pcp   Mediastinal adenopathy    Obesity, Class III, BMI 40-49.9 (morbid obesity) (HCC)    Pancytopenia, acquired (HCC)    Pulmonary nodules    bilateral   Wears glasses     Past Surgical History:  Procedure Laterality Date   BRONCHIAL BIOPSY  12/15/2019   Procedure: BRONCHIAL BIOPSIES;  Surgeon: Josephine Igo, DO;  Location: [facility];  Service: Pulmonary;;   BRONCHIAL NEEDLE ASPIRATION BIOPSY  12/15/2019   Procedure: BRONCHIAL NEEDLE ASPIRATION BIOPSIES;  Surgeon: Josephine Igo, DO;  Location: [facility];  Service: Pulmonary;;   BRONCHIAL WASHINGS  12/15/2019   Procedure: BRONCHIAL WASHINGS;  Surgeon: Josephine Igo, DO;  Location: [facility];  Service: Pulmonary;;   CERVICAL BIOPSY     ENDOBRONCHIAL ULTRASOUND N/A 12/15/2019   Procedure: ENDOBRONCHIAL ULTRASOUND;  Surgeon: Josephine Igo, DO;  Location: [facility];  Service: Pulmonary;  Laterality: N/A;   IR IMAGING GUIDED PORT INSERTION  12/22/2019   IR REMOVAL TUN ACCESS W/ PORT W/O FL MOD SED   06/14/2020   OPERATIVE ULTRASOUND N/A 02/27/2020   Procedure: OPERATIVE ULTRASOUND;  Surgeon: Antony Blackbird, MD;  Location: [facility];  Service: Urology;  Laterality: N/A;   OPERATIVE ULTRASOUND N/A 03/05/2020   Procedure: OPERATIVE ULTRASOUND;  Surgeon: Antony Blackbird, MD;  Location: [facility];  Service: Urology;  Laterality: N/A;   OPERATIVE ULTRASOUND N/A 03/14/2020   Procedure: OPERATIVE ULTRASOUND;  Surgeon: Antony Blackbird, MD;  Location: [facility];  Service: Urology;  Laterality: N/A;   OPERATIVE ULTRASOUND N/A 03/23/2020   Procedure: OPERATIVE ULTRASOUND;  Surgeon: Antony Blackbird, MD;  Location: [facility];  Service: Urology;  Laterality: N/A;   OPERATIVE ULTRASOUND N/A 03/27/2020   Procedure: OPERATIVE ULTRASOUND;  Surgeon: Antony Blackbird, MD;  Location: [facility];  Service: Urology;  Laterality: N/A;   TANDEM RING INSERTION N/A 02/27/2020   Procedure: TANDEM RING INSERTION;  Surgeon: Antony Blackbird, MD;  Location: [facility];  Service: Urology;  Laterality: N/A;   TANDEM RING INSERTION N/A 03/05/2020   Procedure: TANDEM RING INSERTION;  Surgeon: Antony Blackbird, MD;  Location: [facility];  Service: Urology;  Laterality: N/A;   TANDEM RING INSERTION N/A 03/14/2020   Procedure: TANDEM RING INSERTION FOR HIGH DOSE RADIATION THERAPY;  Surgeon: Antony Blackbird, MD;  Location: [facility];  Service: Urology;  Laterality: N/A;   TANDEM RING INSERTION N/A 03/23/2020   Procedure: TANDEM RING INSERTION;  Surgeon: Antony Blackbird, MD;  Location: [facility];  Service: Urology;  Laterality: N/A;   TANDEM RING INSERTION N/A 03/27/2020   Procedure: TANDEM RING INSERTION;  Surgeon: Antony Blackbird, MD;  Location: [facility];  Service: Urology;  Laterality: N/A;  ULTRASOUND AND TECH    Family History  Problem Relation Age of Onset   Colon cancer Mother 91    Ovarian cancer Neg Hx    Uterine cancer Neg Hx    Breast cancer Neg Hx    Cervical cancer Neg Hx     Social History Reviewed with no changes to be made today.   Outpatient Medications Prior to Visit  Medication Sig Dispense Refill   amLODipine (NORVASC) 10 MG tablet Take 1 tablet (10 mg total) by mouth daily. 90 tablet 0   amoxicillin-clavulanate (AUGMENTIN) 875-125 MG tablet Take 1 tablet by mouth every 12 (twelve) hours. (Patient not taking: Reported on 04/20/2023) 20 tablet 0   Blood Pressure Monitor DEVI Please provide patient with insurance approved blood pressure monitor (Patient not taking: Reported on 04/20/2023) 1 each 0   cephALEXin (KEFLEX) 500 MG capsule Take 1 capsule (500 mg total) by mouth 3 (three) times daily. (Patient not taking: Reported on 04/20/2023) 30 capsule 0   gabapentin (NEURONTIN) 300 MG capsule Take 1 capsule (300 mg total) by mouth once nightly at bedtime. (Patient not taking: Reported on 04/20/2023) 90 capsule 0   HYDROcodone-acetaminophen (NORCO/VICODIN) 5-325 MG tablet Take 1 tablet by mouth every 6 (six) hours as needed for moderate pain or severe pain. (Patient not taking: Reported on 04/20/2023) 8 tablet 0   ondansetron (ZOFRAN-ODT) 8 MG disintegrating tablet Take 1  tablet (8 mg total) by mouth every 8 (eight) hours as needed for nausea. (Patient not taking: Reported on 04/20/2023) 20 tablet 0   phenazopyridine (PYRIDIUM) 200 MG tablet Take 1 tablet (200 mg total) by mouth 3 (three) times daily. (Patient not taking: Reported on 04/20/2023) 6 tablet 0   No facility-administered medications prior to visit.    Allergies  Allergen Reactions   Nsaids     Not recommended per oncology due to HTN and potential for ARI       Objective:    BP (!) 148/83   Pulse 92   Ht 5\' 5"  (1.651 m)   Wt 260 lb 12.8 oz (118.3 kg)   SpO2 99%   BMI 43.40 kg/m  Wt Readings from Last 3 Encounters:  04/20/23 260 lb 12.8 oz (118.3 kg)  03/10/23 286 lb (129.7 kg)   01/29/23 261 lb (118.4 kg)    Physical Exam Vitals and nursing note reviewed.  Constitutional:      Appearance: She is well-developed.  HENT:     Head: Normocephalic and atraumatic.  Cardiovascular:     Rate and Rhythm: Normal rate and regular rhythm.     Heart sounds: Normal heart sounds. No murmur heard.    No friction rub. No gallop.  Pulmonary:     Effort: Pulmonary effort is normal. No tachypnea or respiratory distress.     Breath sounds: Normal breath sounds. No decreased breath sounds, wheezing, rhonchi or rales.  Chest:     Chest wall: No tenderness.  Abdominal:     General: Bowel sounds are normal.     Palpations: Abdomen is soft.  Musculoskeletal:        General: Normal range of motion.     Cervical back: Normal range of motion.  Skin:    General: Skin is warm and dry.  Neurological:     Mental Status: She is alert and oriented to person, place, and time.     Coordination: Coordination normal.  Psychiatric:        Behavior: Behavior normal. Behavior is cooperative.        Thought Content: Thought content normal.        Judgment: Judgment normal.          Patient has been counseled extensively about nutrition and exercise as well as the importance of adherence with medications and regular follow-up. The patient was given clear instructions to go to ER or return to medical center if symptoms don't improve, worsen or new problems develop. The patient verbalized understanding.   Follow-up: Return in about 18 days (around 05/08/2023) for BP CHECK VIRTUAL VISIT 1110 or 130.   Claiborne Rigg, FNP-BC [facility] [place], [place] [phone]   04/20/2023, 8:36 PM

## 2023-04-20 NOTE — Patient Instructions (Signed)
[facility] ?Located in: [facility] ?Address: [address], [place], [place] [address] ?Phone: [phone] ?

## 2023-04-21 ENCOUNTER — Other Ambulatory Visit: Payer: Self-pay | Admitting: Nurse Practitioner

## 2023-04-21 ENCOUNTER — Telehealth: Payer: Self-pay | Admitting: *Deleted

## 2023-04-21 ENCOUNTER — Other Ambulatory Visit: Payer: Self-pay

## 2023-04-21 DIAGNOSIS — E559 Vitamin D deficiency, unspecified: Secondary | ICD-10-CM

## 2023-04-21 LAB — VITAMIN D 25 HYDROXY (VIT D DEFICIENCY, FRACTURES): Vit D, 25-Hydroxy: 15.6 ng/mL — ABNORMAL LOW (ref 30.0–100.0)

## 2023-04-21 MED ORDER — VITAMIN D (ERGOCALCIFEROL) 1.25 MG (50000 UNIT) PO CAPS
50000.0000 [IU] | ORAL_CAPSULE | ORAL | 0 refills | Status: DC
  Filled 2023-04-21: qty 12, 84d supply, fill #0

## 2023-04-21 NOTE — Telephone Encounter (Signed)
Spoke with Ms. Lansdowne who called the office to schedule her 6 month follow up with Dr. Pricilla Holm. Pt was given an appointment for Friday, February 7th at 2:00. Pt agreed to date and time and had no further concerns or questions at this time.

## 2023-04-21 NOTE — Telephone Encounter (Signed)
RETURNED PATIENT'S PHONE CALL, SPOKE WITH PATIENT, MISSED FU FROM 01-05-23 HAS BEEN RESCHEDULED FOR 05-07-23 @ 8:30 AM

## 2023-04-24 ENCOUNTER — Encounter (HOSPITAL_COMMUNITY): Payer: Self-pay

## 2023-04-24 ENCOUNTER — Other Ambulatory Visit: Payer: Self-pay

## 2023-04-24 ENCOUNTER — Emergency Department (HOSPITAL_COMMUNITY): Payer: Medicaid Other

## 2023-04-24 ENCOUNTER — Emergency Department (HOSPITAL_COMMUNITY)
Admission: EM | Admit: 2023-04-24 | Discharge: 2023-04-24 | Disposition: A | Discharge status: Home / Self Care | Payer: Medicaid Other | Attending: Student | Admitting: Student

## 2023-04-24 DIAGNOSIS — Z79899 Other long term (current) drug therapy: Secondary | ICD-10-CM | POA: Diagnosis not present

## 2023-04-24 DIAGNOSIS — R079 Chest pain, unspecified: Secondary | ICD-10-CM

## 2023-04-24 DIAGNOSIS — E876 Hypokalemia: Secondary | ICD-10-CM | POA: Insufficient documentation

## 2023-04-24 DIAGNOSIS — I10 Essential (primary) hypertension: Secondary | ICD-10-CM | POA: Diagnosis not present

## 2023-04-24 LAB — CBC
HCT: 35.4 % — ABNORMAL LOW (ref 36.0–46.0)
Hemoglobin: 10.7 g/dL — ABNORMAL LOW (ref 12.0–15.0)
MCH: 25 pg — ABNORMAL LOW (ref 26.0–34.0)
MCHC: 30.2 g/dL (ref 30.0–36.0)
MCV: 82.7 fL (ref 80.0–100.0)
Platelets: 249 10*3/uL (ref 150–400)
RBC: 4.28 MIL/uL (ref 3.87–5.11)
RDW: 14.2 % (ref 11.5–15.5)
WBC: 5.5 10*3/uL (ref 4.0–10.5)
nRBC: 0 % (ref 0.0–0.2)

## 2023-04-24 LAB — BASIC METABOLIC PANEL
Anion gap: 9 (ref 5–15)
BUN: 5 mg/dL — ABNORMAL LOW (ref 8–23)
CO2: 26 mmol/L (ref 22–32)
Calcium: 8.8 mg/dL — ABNORMAL LOW (ref 8.9–10.3)
Chloride: 107 mmol/L (ref 98–111)
Creatinine, Ser: 0.86 mg/dL (ref 0.44–1.00)
GFR, Estimated: >60 mL/min (ref >60)
Glucose, Bld: 96 mg/dL (ref 70–99)
Potassium: 3.4 mmol/L — ABNORMAL LOW (ref 3.5–5.1)
Sodium: 142 mmol/L (ref 135–145)

## 2023-04-24 LAB — TROPONIN I (HIGH SENSITIVITY)
Troponin I (High Sensitivity): 6 ng/L (ref <18)
Troponin I (High Sensitivity): 9 ng/L (ref <18)
Troponin I (High Sensitivity): 8 ng/L (ref <18)

## 2023-04-24 MED ORDER — ACETAMINOPHEN 500 MG PO TABS
1000.0000 mg | ORAL_TABLET | Freq: Once | ORAL | Status: AC
Start: 2023-04-24 — End: 2023-04-24
  Administered 2023-04-24: 1000 mg via ORAL
  Filled 2023-04-24: qty 2

## 2023-04-24 MED ORDER — ASPIRIN 81 MG PO CHEW
324.0000 mg | CHEWABLE_TABLET | Freq: Once | ORAL | Status: AC
  Administered 2023-04-24: 324 mg via ORAL
  Filled 2023-04-24: qty 4

## 2023-04-24 NOTE — ED Provider Notes (Signed)
  Physical Exam  BP (!) 163/74   Pulse 75   Temp 98.3 F (36.8 C)   Resp 17   Ht 5\' 5"  (1.651 m)   Wt 117.9 kg   SpO2 100%   BMI 43.27 kg/m   Physical Exam  Procedures  Procedures  ED Course / MDM   Clinical Course as of 04/24/23 1827  Fri Apr 24, 2023  1138 ED EKG Sinus rhythm.  Rate of 80.  Normal intervals.  No axis deviation.  Biphasic T wave in lead III, aVF.  No ST segment changes.  Compared to ECG from 2021, no significant changes noted. [JR]  1139 DG Chest 2 View I independently reviewed this image.  No evidence of cardiomegaly or pneumothorax.  Some dependent pulmonary edema noted. [JR]  1517 S- hx HTN here for L chest pain. -Fu troponin, then DC [HJ]    Clinical Course User Index [HJ] Janyth Pupa, MD [JR] Rolla Flatten, MD   Medical Decision Making Amount and/or Complexity of Data Reviewed Labs: ordered. Radiology: ordered. Decision-making details documented in ED Course. ECG/medicine tests:  Decision-making details documented in ED Course.  Risk OTC drugs.   Troponin was accidentally lost, repeat was ordered and was 8 on recheck.  Patient reevaluated, she reports she is feeling well and is ready to go home.  Discussed following up with her PCP and results of her laboratory and imaging evaluations.  Patient reports she feels she may have pulled a muscle, I agree this is possible but we cannot be 100% sure so encouraged her to follow-up with her PCP for further risk stratification. Strict return precautions discussed, and she voiced understanding. She was discharged in stable condition.     Janyth Pupa, MD 04/24/23 1827    Charlynne Pander, MD 04/24/23 [phone]

## 2023-04-24 NOTE — ED Triage Notes (Signed)
Pt c/o intermittent left sided chest pain that started this am. Pt has had shortness of breath w/this pain, denies nausea or vomiting.

## 2023-04-24 NOTE — ED Provider Notes (Signed)
[place] EMERGENCY DEPARTMENT AT [facility] Provider Note   CSN: [id number] Arrival date & time: 04/24/23  0347     History  Chief Complaint  Patient presents with   Chest Pain    Anne Harris is a 62 y.o. female.  62 year old female with past medical history of HTN presents here for chest pain that woke her from sleep around 6:30 AM today.  Pain is left-sided.  It is sharp and achy in nature.  Nonradiating.  Associated with palpitations.  She denies shortness of breath her chest pain is not exertional.  She has not had nausea, vomiting, or diaphoresis.  She does endorse a left-sided headache today.  No lightheadedness.  No recent illnesses.  Denies associated back pain. No prior history of chest pain or MI.  No family history of MI.  No history of tobacco use.  The history is provided by the patient.  Chest Pain      Home Medications Prior to Admission medications   Medication Sig Start Date End Date Taking? Authorizing Provider  amLODipine (NORVASC) 10 MG tablet Take 1 tablet (10 mg total) by mouth daily. 03/05/23   Claiborne Rigg, NP  losartan (COZAAR) 25 MG tablet Take 1 tablet (25 mg total) by mouth daily. 04/20/23   Claiborne Rigg, NP  Vitamin D, Ergocalciferol, (DRISDOL) 1.25 MG (50000 UNIT) CAPS capsule Take 1 capsule (50,000 Units total) by mouth every 7 (seven) days. 04/21/23   Claiborne Rigg, NP  prochlorperazine (COMPAZINE) 10 MG tablet Take 1 tablet (10 mg total) by mouth every 6 (six) hours as needed (Nausea or vomiting). 12/14/19 05/22/20  Artis Delay, MD      Allergies    Nsaids    Review of Systems   As noted in HPI  Physical Exam Updated Vital Signs BP (!) 163/74   Pulse 75   Temp 98.3 F (36.8 C)   Resp 17   Ht 5\' 5"  (1.651 m)   Wt 117.9 kg   SpO2 100%   BMI 43.27 kg/m  Physical Exam Vitals reviewed.  Constitutional:      General: She is not in acute distress.    Appearance: She is obese. She is not ill-appearing,  toxic-appearing or diaphoretic.  HENT:     Head: Normocephalic.  Cardiovascular:     Rate and Rhythm: Regular rhythm.     Pulses: Normal pulses.          Radial pulses are 2+ on the right side and 2+ on the left side.       Dorsalis pedis pulses are 2+ on the left side.     Heart sounds: Normal heart sounds. No murmur heard.    No friction rub. No gallop.  Pulmonary:     Effort: Pulmonary effort is normal. No respiratory distress.     Breath sounds: Normal breath sounds. No wheezing, rhonchi or rales.  Abdominal:     General: There is no distension.     Palpations: Abdomen is soft.     Tenderness: There is no abdominal tenderness. There is no guarding or rebound.  Musculoskeletal:     Right lower leg: No edema.     Left lower leg: No edema.  Skin:    General: Skin is warm and dry.  Neurological:     Mental Status: She is alert.     ED Results / Procedures / Treatments   Labs (all labs ordered are listed, but only abnormal results are displayed)  Labs Reviewed  BASIC METABOLIC PANEL - Abnormal; Notable for the following components:      Result Value   Potassium 3.4 (*)    BUN 5 (*)    Calcium 8.8 (*)    All other components within normal limits  CBC - Abnormal; Notable for the following components:   Hemoglobin 10.7 (*)    HCT 35.4 (*)    MCH 25.0 (*)    All other components within normal limits  TROPONIN I (HIGH SENSITIVITY)  TROPONIN I (HIGH SENSITIVITY)  TROPONIN I (HIGH SENSITIVITY)    EKG None  Radiology DG Chest 2 View  Result Date: 04/24/2023 CLINICAL DATA:  Chest pain EXAM: CHEST - 2 VIEW COMPARISON:  01/29/2023 FINDINGS: Heart size is normal. No pleural fluid, interstitial edema or airspace disease. Spondylosis noted within the thoracic spine. The visualized osseous structures appear intact. IMPRESSION: No acute cardiopulmonary disease. Electronically Signed   By: Signa Kell M.D.   On: 04/24/2023 11:11    Procedures Procedures    Medications  Ordered in ED Medications  aspirin chewable tablet 324 mg (324 mg Oral Given 04/24/23 1118)  acetaminophen (TYLENOL) tablet 1,000 mg (1,000 mg Oral Given 04/24/23 1532)    ED Course/ Medical Decision Making/ A&P Clinical Course as of 04/24/23 1709  Fri Apr 24, 2023  1138 ED EKG Sinus rhythm.  Rate of 80.  Normal intervals.  No axis deviation.  Biphasic T wave in lead III, aVF.  No ST segment changes.  Compared to ECG from 2021, no significant changes noted. [JR]  1139 DG Chest 2 View I independently reviewed this image.  No evidence of cardiomegaly or pneumothorax.  Some dependent pulmonary edema noted. [JR]  1517 S- hx HTN here for L chest pain. -Fu troponin, then DC [HJ]    Clinical Course User Index [HJ] Janyth Pupa, MD [JR] Rolla Flatten, MD                                 Medical Decision Making Amount and/or Complexity of Data Reviewed Labs: ordered. Radiology: ordered and independent interpretation performed. Decision-making details documented in ED Course. ECG/medicine tests: ordered and independent interpretation performed. Decision-making details documented in ED Course.  Risk OTC drugs.   62 year old female presents here for left-sided chest pain.  Vitals reassuring on presentation.  On my exam, no cardiac murmurs noted.  2+ peripheral pulses.  No lower extremity edema.  Abdomen soft, nontender.  Initial differential diagnosis includes ACS, PE, pneumothorax, aortic dissection, pleurisy, costochondritis.  I considered PE.  However, feel that this is unlikely.  Patient has no clinical evidence of PE on physical exam.  She is not tachycardic.  Based on the description of her symptoms, PE would be less likely.  I considered aortic dissection.  However, patient has 2+ peripheral pulses in bilateral upper and lower extremities.  She is not having associated back pain.  Doubt dissection as etiology of her pain.  ECG independently reviewed and compared to prior.  ECG unchanged  from prior and is overall nonischemic today.  Chest x-ray independently reviewed and is not consistent with pneumonia, pneumothorax.  BMP, CBC, troponin ordered for further evaluation of electrolyte abnormality, anemia, ACS.  324 mg of aspirin given for cardioprotection given concerns for ACS.  CBC notable for normocytic anemia that is not emergent in nature and slightly decreased from patient's baseline.  BMP notable only for hypokalemia with potassium of 3.4.  Initial troponin within normal limits at 6.  Repeat troponin pending.  On reassessment, patient endorses improvement in the frequency of her chest pain.  She states that the area is still aching and wonders if she is having muscle pain.  Will treat her symptoms with 1000 mg Tylenol.  Patient awaiting repeat troponin at the conclusion of my shift.  Care of this patient signed out to Dr. Jean Rosenthal.  Patient's presentation is most consistent with acute presentation with potential threat to life or bodily function.         Final Clinical Impression(s) / ED Diagnoses Final diagnoses:  Chest pain, unspecified type    Rx / DC Orders ED Discharge Orders     None         Rolla Flatten, MD 04/24/23 1712    Glendora Score, MD 04/24/23 [phone]

## 2023-04-24 NOTE — Discharge Instructions (Signed)
You were seen here today for chest pain.  Your workup showed no problem with your electrolytes, cell counts, kidney function, or heart enzymes.  Please use all medications as prescribed.  Please follow-up with your PCP.  Please return to the emergency department for chest pain, shortness of breath, severe vomiting or inability to keep down food, loss of consciousness, or any worsening symptom or concern.

## 2023-04-29 ENCOUNTER — Other Ambulatory Visit: Payer: Self-pay

## 2023-05-06 NOTE — Progress Notes (Signed)
Radiation Oncology         [phone] ________________________________  Name: Anne Harris MRN: [id number]  Date: 05/07/2023  DOB: 01/19/1961  Follow-Up Visit Note  CC: Claiborne Rigg, NP  Claiborne Rigg, NP  No diagnosis found.  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 She was last seen here for follow-up on 04/03/22 and was a no show to her appointment with [place] in August. She was also a now show to her routine follow-up with gyn-onc this past April.  Since last being seen, she has had several ED encounters related to dental infections. She also presented to the ED on 03/10/23 with c/o of left sided flank pain and several days of burning with urination. A renal stone CT study was performed at that time which showed no acute findings. Cultures however came back positive for UTI and she was prescribed a course of Keflex. Her most recent ED encounter was on 04/24/23, where she presented with chest pain. ED work-up including a chest x-ray were overall unremarkable for a cardiopulmonary etiology and her symptoms seemed to improved without intervention.            No other significant interval history since the patient was last seen for follow-up.  ***                        Allergies:  is allergic to nsaids.  Meds: Current Outpatient Medications  Medication Sig Dispense Refill   amLODipine (NORVASC) 10 MG tablet Take 1 tablet (10 mg total) by mouth daily. 90  tablet 0   losartan (COZAAR) 25 MG tablet Take 1 tablet (25 mg total) by mouth daily. 90 tablet 1   Vitamin D, Ergocalciferol, (DRISDOL) 1.25 MG (50000 UNIT) CAPS capsule Take 1 capsule (50,000 Units total) by mouth every 7 (seven) days. 12 capsule 0   No current facility-administered medications for this encounter.    Physical Findings: The patient is in no acute distress. Patient is alert and oriented.  vitals were not taken for this visit. .  No significant changes. Lungs are clear to auscultation bilaterally. Heart has regular rate and rhythm. No palpable cervical, supraclavicular, or axillary adenopathy. Abdomen soft, non-tender, normal bowel sounds.  On pelvic examination the external genitalia were unremarkable. A speculum exam was performed. There are no mucosal lesions noted in the vaginal vault. A Pap smear was obtained of the proximal vagina. On bimanual and rectovaginal examination there were no pelvic masses appreciated. ***   Lab Findings: Lab Results  Component Value Date   WBC 5.5 04/24/2023   HGB 10.7 (L) 04/24/2023   HCT 35.4 (L) 04/24/2023   MCV 82.7 04/24/2023   PLT 249 04/24/2023    Radiographic Findings: DG Chest 2 View  Result Date: 04/24/2023 CLINICAL DATA:  Chest pain EXAM: CHEST - 2 VIEW COMPARISON:  01/29/2023 FINDINGS: Heart size is normal. No pleural fluid, interstitial edema or airspace disease. Spondylosis noted within the  thoracic spine. The visualized osseous structures appear intact. IMPRESSION: No acute cardiopulmonary disease. Electronically Signed   By: Signa Kell M.D.   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  ***  Plan:  ***   *** minutes of total time was spent for this patient encounter, including preparation, face-to-face counseling with the patient and coordination of care, physical exam, and documentation of the  encounter. ____________________________________  Billie Lade, PhD, MD  This document serves as a record of services personally performed by Antony Blackbird, MD. It was created on his behalf by Neena Rhymes, a trained medical scribe. The creation of this record is based on the scribe's personal observations and the provider's statements to them. This document has been checked and approved by the attending provider.

## 2023-05-07 ENCOUNTER — Other Ambulatory Visit (HOSPITAL_COMMUNITY)
Admission: RE | Admit: 2023-05-07 | Discharge: 2023-05-07 | Disposition: A | Discharge status: Home / Self Care | Payer: Medicaid Other | Source: Ambulatory Visit | Attending: Radiation Oncology | Admitting: Radiation Oncology

## 2023-05-07 ENCOUNTER — Ambulatory Visit: Payer: Medicaid Other | Attending: Nurse Practitioner

## 2023-05-07 ENCOUNTER — Ambulatory Visit
Admission: RE | Admit: 2023-05-07 | Discharge: 2023-05-07 | Disposition: A | Discharge status: Home / Self Care | Payer: Medicaid Other | Source: Ambulatory Visit | Attending: Radiation Oncology | Admitting: Radiation Oncology

## 2023-05-07 ENCOUNTER — Encounter: Payer: Self-pay | Admitting: Radiation Oncology

## 2023-05-07 VITALS — BP 145/88 | HR 101 | Temp 97.3°F | Resp 18 | Ht 65.0 in | Wt 265.4 lb

## 2023-05-07 DIAGNOSIS — C530 Malignant neoplasm of endocervix: Secondary | ICD-10-CM | POA: Diagnosis present

## 2023-05-07 DIAGNOSIS — Z79899 Other long term (current) drug therapy: Secondary | ICD-10-CM | POA: Insufficient documentation

## 2023-05-07 DIAGNOSIS — C539 Malignant neoplasm of cervix uteri, unspecified: Secondary | ICD-10-CM

## 2023-05-07 DIAGNOSIS — Z923 Personal history of irradiation: Secondary | ICD-10-CM | POA: Diagnosis not present

## 2023-05-07 DIAGNOSIS — Z8541 Personal history of malignant neoplasm of cervix uteri: Secondary | ICD-10-CM | POA: Insufficient documentation

## 2023-05-07 NOTE — Progress Notes (Signed)
Anne Harris is here today for follow up post radiation to the pelvic.  They completed their radiation on: 03/27/20   Does the patient complain of any of the following:  Pain: No Abdominal bloating: No Diarrhea/Constipation: No Nausea/Vomiting: No Vaginal Discharge: No Blood in Urine or Stool: No Urinary Issues (dysuria/incomplete emptying/ incontinence/ increased frequency/urgency): No Does patient report using vaginal dilator 2-3 times a week and/or sexually active 2-3 weeks: yes Post radiation skin changes: No   Additional comments if applicable:  Last pap smear 04/03/22  BP (!) 145/88 (BP Location: Left Arm, Patient Position: Sitting)   Pulse (!) 101   Temp (!) 97.3 F (36.3 C) (Temporal)   Resp 18   Ht 5\' 5"  (1.651 m)   Wt 265 lb 6 oz (120.4 kg)   SpO2 100%   BMI 44.16 kg/m

## 2023-05-08 ENCOUNTER — Telehealth: Payer: Medicaid Other | Admitting: Nurse Practitioner

## 2023-05-13 LAB — CYTOLOGY - PAP
Comment: NEGATIVE
Diagnosis: NEGATIVE
High risk HPV: NEGATIVE

## 2023-05-14 ENCOUNTER — Telehealth: Payer: Self-pay

## 2023-05-14 NOTE — Telephone Encounter (Signed)
Called to make patient aware of negative pap smear result per Dr. Roselind Messier. Patient voiced understanding.

## 2023-06-14 ENCOUNTER — Other Ambulatory Visit: Payer: Self-pay | Admitting: Nurse Practitioner

## 2023-06-14 DIAGNOSIS — I10 Essential (primary) hypertension: Secondary | ICD-10-CM

## 2023-06-14 MED ORDER — AMLODIPINE BESYLATE 10 MG PO TABS
10.0000 mg | ORAL_TABLET | Freq: Every day | ORAL | 0 refills | Status: DC
  Filled 2023-06-14: qty 90, 90d supply, fill #0

## 2023-06-15 ENCOUNTER — Other Ambulatory Visit: Payer: Self-pay

## 2023-06-17 ENCOUNTER — Other Ambulatory Visit: Payer: Self-pay

## 2023-07-10 ENCOUNTER — Ambulatory Visit: Payer: Medicaid Other | Admitting: Gynecologic Oncology

## 2023-07-14 ENCOUNTER — Encounter: Payer: Self-pay | Admitting: Gynecologic Oncology

## 2023-07-17 ENCOUNTER — Inpatient Hospital Stay: Payer: Medicaid Other | Attending: Gynecologic Oncology | Admitting: Gynecologic Oncology

## 2023-07-17 ENCOUNTER — Encounter: Payer: Self-pay | Admitting: Gynecologic Oncology

## 2023-07-17 VITALS — BP 141/77 | HR 84 | Temp 98.7°F | Resp 16 | Ht 65.0 in | Wt 267.0 lb

## 2023-07-17 DIAGNOSIS — R220 Localized swelling, mass and lump, head: Secondary | ICD-10-CM | POA: Diagnosis not present

## 2023-07-17 DIAGNOSIS — Z9221 Personal history of antineoplastic chemotherapy: Secondary | ICD-10-CM | POA: Insufficient documentation

## 2023-07-17 DIAGNOSIS — Z8541 Personal history of malignant neoplasm of cervix uteri: Secondary | ICD-10-CM | POA: Insufficient documentation

## 2023-07-17 DIAGNOSIS — C539 Malignant neoplasm of cervix uteri, unspecified: Secondary | ICD-10-CM

## 2023-07-17 DIAGNOSIS — L989 Disorder of the skin and subcutaneous tissue, unspecified: Secondary | ICD-10-CM

## 2023-07-17 DIAGNOSIS — Z08 Encounter for follow-up examination after completed treatment for malignant neoplasm: Secondary | ICD-10-CM | POA: Diagnosis not present

## 2023-07-17 DIAGNOSIS — Z923 Personal history of irradiation: Secondary | ICD-10-CM | POA: Diagnosis not present

## 2023-07-17 NOTE — Patient Instructions (Addendum)
It was good to see you today.  I do not see or feel any evidence of cancer recurrence on your exam.  We have placed a referral to [facility] for the facial cyst-like area on your forehead and you should be receiving a phone call about an appointment.  I will see you for follow-up in 9 months.  As always, if you develop any new and concerning symptoms before your next visit, please call to see me sooner.

## 2023-07-17 NOTE — Progress Notes (Signed)
Gynecologic Oncology Return Clinic Visit  07/17/23  Reason for Visit: Surveillance in the setting of locally advanced cervix cancer   Treatment History: Oncology History  Squamous cell carcinoma of cervix (HCC)  10/21/2019 Initial Diagnosis   The patient presented to the emergency department on 5/25 and again on 6/3 for abdominal pain and postmenopausal bleeding that started 5/21.  On 6/10 she underwent Pap smear.  Last Pap smear had been 30 years prior and normal per patient.     11/21/2019 Pathology Results   A. CERVIX, BIOPSY:  -  Squamous cell carcinoma    11/29/2019 PET scan   1. Highly hypermetabolic cervical mass, with fluid distended endometrial cavity. Maximum SUV of the cervical mass is 25.8. 2. Left adnexal lesion versus more likely fundal fibroid, maximum SUV 6.0. Surveillance suggested. 3. There is low-grade activity in several abdominal lymph nodes including a borderline enlarged peripancreatic lymph node. However, there is striking activity in numerous enlarged thoracic lymph nodes, some of which have associated calcifications. It would be unusual to have this degree of thoracic adenopathy from cervical cancer without having a commensurate level of abdominopelvic adenopathy. This along with the calcifications of the lymph nodes raises the possibility of active granulomatous process (such as sarcoidosis) as a potential cause for the striking thoracic adenopathy. Concomitant lymphoma might also have a similar appearance. Sampling of thoracic lymph nodes may be helpful in differentiating metastatic disease other entities such as sarcoid or Lymphoma.  4. Mild pleural/subpleural nodularity in the lungs, merit surveillance. 5. Cholelithiasis.   11/30/2019 Cancer Staging   Staging form: Cervix Uteri, AJCC Version 9 - Clinical stage from 11/30/2019: FIGO Stage IIB (cT2b, cN0, cM0) - Signed by Artis Delay, MD on 11/30/2019   12/02/2019 Imaging   Ct abdomen and pelvis 1. Cervical soft  tissue mass as described in keeping with known malignancy. 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 2. Mildly rounded subcentimeter aortocaval and right pelvic sidewall lymph nodes. 3. A 5.3 x 3.9 cm left fundal exophytic soft tissue suboptimally characterized but possibly a fibroid. 4. Cholelithiasis. 5. No bowel obstruction. Normal appendix. 6. Small bilateral lung base subpleural nodules similar to prior PET CT. Clinical correlation and follow-up recommended.   12/22/2019 Procedure   Successful placement of a right internal jugular approach power injectable Port-A-Cath. The catheter is ready for immediate use.   12/28/2019 - 03/27/2020 Radiation Therapy   12/28/2019 to 02/09/2020 followed by five HDR brachytherapy treatments from 02/27/2020 to 03/27/2020   Site: Cervix Technique: 3D Total Dose (Gy): 45/45 Dose per Fx (Gy): 1.8 Completed Fx: 25/25 Beam Energies: 6X, 10X, 15X   Site: Cervix boost Technique: 3D Total Dose (Gy): 9/9 Dose per Fx (Gy): 1.8 Completed Fx: 5/5 Beam Energies: 15X   12/29/2019 - 01/27/2020 Chemotherapy   The patient had cisplatin for chemotherapy treatment.     05/22/2020 PET scan   1. Compared with the previous PET-CT of 6 months ago, the large hypermetabolic cervical mass has resolved consistent with response to therapy. 2. The previously demonstrated partially calcified mediastinal and hilar adenopathy has mildly improved. 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 3. Stable incidental findings including a probable exophytic fundal fibroid, cholelithiasis and periodontal disease.    Radiation Therapy   Pelvic radiation and HDR brachytherapy   06/15/2020 Procedure   Successful right IJ vein Port-A-Cath explant.     Interval History: Doing  well.  Denies any  vaginal bleeding or discharge.  Reports baseline bowel bladder function.  Has developed a new lump above her left eyebrow, denies any pain or other symptoms related to this.  Past Medical/Surgical History: Past Medical History:  Diagnosis Date   Abnormal Pap smear of cervix    Anemia    Cervical cancer [facility]) oncologist-- dr Bertis Ruddy and dr Roselind Messier   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 Roselind Messier   History of radiation therapy 12/28/2019-02/09/2020   Cervix   Dr Antony Blackbird   Hypertension    followed by pcp   Mediastinal adenopathy    Obesity, Class III, BMI 40-49.9 (morbid obesity) (HCC)    Pancytopenia, acquired (HCC)    Pulmonary nodules    bilateral   Wears glasses     Past Surgical History:  Procedure Laterality Date   BRONCHIAL BIOPSY  12/15/2019   Procedure: BRONCHIAL BIOPSIES;  Surgeon: Josephine Igo, DO;  Location: [facility];  Service: Pulmonary;;   BRONCHIAL NEEDLE ASPIRATION BIOPSY  12/15/2019   Procedure: BRONCHIAL NEEDLE ASPIRATION BIOPSIES;  Surgeon: Josephine Igo, DO;  Location: [facility];  Service: Pulmonary;;   BRONCHIAL WASHINGS  12/15/2019   Procedure: BRONCHIAL WASHINGS;  Surgeon: Josephine Igo, DO;  Location: [facility];  Service: Pulmonary;;   CERVICAL BIOPSY     ENDOBRONCHIAL ULTRASOUND N/A 12/15/2019   Procedure: ENDOBRONCHIAL ULTRASOUND;  Surgeon: Josephine Igo, DO;  Location: [facility];  Service: Pulmonary;  Laterality: N/A;   IR IMAGING GUIDED PORT INSERTION  12/22/2019   IR REMOVAL TUN ACCESS W/ PORT W/O FL MOD SED  06/14/2020   MOUTH SURGERY  04/02/2023   OPERATIVE ULTRASOUND N/A 02/27/2020   Procedure: OPERATIVE ULTRASOUND;  Surgeon: Antony Blackbird, MD;  Location[facility];  Service: Urology;  Laterality: N/A;   OPERATIVE  ULTRASOUND N/A 03/05/2020   Procedure: OPERATIVE ULTRASOUND;  Surgeon: Antony Blackbird, MD;  Location: [facility];  Service: Urology;  Laterality: N/A;   OPERATIVE ULTRASOUND N/A 03/14/2020   Procedure: OPERATIVE ULTRASOUND;  Surgeon: Antony Blackbird, MD;  Location: [facility];  Service: Urology;  Laterality: N/A;   OPERATIVE ULTRASOUND N/A 03/23/2020   Procedure: OPERATIVE ULTRASOUND;  Surgeon: Antony Blackbird, MD;  Location: [facility];  Service: Urology;  Laterality: N/A;   OPERATIVE ULTRASOUND N/A 03/27/2020   Procedure: OPERATIVE ULTRASOUND;  Surgeon: Antony Blackbird, MD;  Location: [facility];  Service: Urology;  Laterality: N/A;   TANDEM RING INSERTION N/A 02/27/2020   Procedure: TANDEM RING INSERTION;  Surgeon: Antony Blackbird, MD;  Location: [facility];  Service: Urology;  Laterality: N/A;   TANDEM RING INSERTION N/A 03/05/2020   Procedure: TANDEM RING INSERTION;  Surgeon: Antony Blackbird, MD;  Location: [facility];  Service: Urology;  Laterality: N/A;   TANDEM RING INSERTION N/A 03/14/2020   Procedure: TANDEM RING INSERTION FOR HIGH DOSE RADIATION THERAPY;  Surgeon: Antony Blackbird, MD;  Location: [facility];  Service: Urology;  Laterality: N/A;   TANDEM RING INSERTION N/A 03/23/2020   Procedure: TANDEM RING INSERTION;  Surgeon: Antony Blackbird, MD;  Location: [facility];  Service: Urology;  Laterality: N/A;   TANDEM RING INSERTION N/A 03/27/2020   Procedure: TANDEM RING INSERTION;  Surgeon: Antony Blackbird, MD;  Location: [facility];  Service: Urology;  Laterality: N/A;  ULTRASOUND AND TECH    Family History  Problem Relation Age of Onset   Colon cancer Mother 46   Ovarian cancer Neg Hx    Uterine cancer Neg Hx    Breast cancer Neg Hx    Cervical cancer Neg Hx     Social History   Socioeconomic History   Marital status: Single    Spouse name: Not on  file   Number of children: 6   Years of education: Not on file   Highest education level: High school graduate  Occupational History   Occupation: care giver  Tobacco Use   Smoking status: Never   Smokeless tobacco: Never  Vaping Use   Vaping status: Never Used  Substance and Sexual Activity   Alcohol use: Yes    Comment: social   Drug use: Never   Sexual activity: Not Currently  Other Topics Concern   Not on file  Social History Narrative   Not on file   Social Drivers of Health   Financial Resource Strain: Not on file  Food Insecurity: Not on file  Transportation Needs: No Transportation Needs (11/10/2019)   PRAPARE - Administrator, Civil Service (Medical): No    Lack of Transportation (Non-Medical): No  Physical Activity: Not on file  Stress: Not on file  Social Connections: Not on file    Current Medications:  Current Outpatient Medications:    amLODipine (NORVASC) 10 MG tablet, Take 1 tablet (10 mg total) by mouth daily., Disp: 90 tablet, Rfl: 0   losartan (COZAAR) 25 MG tablet, Take 1 tablet (25 mg total) by mouth daily., Disp: 90 tablet, Rfl: 1  Review of Systems: Denies appetite changes, fevers, chills, fatigue, unexplained weight changes. Denies hearing loss, neck lumps or masses, mouth sores, ringing in ears or voice changes. Denies cough or wheezing.  Denies shortness of breath. Denies chest pain or palpitations. Denies leg swelling. Denies abdominal distention, pain, blood in stools, constipation, diarrhea, nausea, vomiting, or early satiety. Denies pain with intercourse, dysuria, frequency, hematuria or incontinence. Denies hot flashes, pelvic pain, vaginal bleeding or vaginal discharge.   Denies joint pain, back pain or muscle pain/cramps. Denies itching, rash, or wounds. Denies dizziness, headaches, numbness or seizures. Denies swollen lymph nodes or glands, denies easy bruising or bleeding. Denies anxiety, depression, confusion, or  decreased concentration.  Physical Exam: BP (!) 141/77 (BP Location: Left Arm, Patient Position: Sitting) Comment: pt declined recheck  Pulse 84   Temp 98.7 F (37.1 C) (Oral)   Resp 16   Ht 5\' 5"  (1.651 m)   Wt 267 lb (121.1 kg)   SpO2 100%   BMI 44.43 kg/m  General: Alert, oriented, no acute distress. HEENT: Normocephalic, atraumatic, sclera anicteric.  Small, round, smooth 1 cm lump just above the left eyebrow, skin covering is normal. Chest: Clear to auscultation bilaterally.  No wheezes or rhonchi. Cardiovascular: Regular rate and rhythm, no murmurs. Abdomen: Obese, soft, nontender.  Normoactive bowel sounds.  No masses or hepatosplenomegaly appreciated.   Extremities: Grossly normal range of motion.  Warm, well perfused.  No edema bilaterally. Skin: No rashes or lesions noted. Lymphatics: No cervical, supraclavicular, or inguinal adenopathy. GU: Normal appearing external genitalia without erythema, excoriation, or lesions.  Speculum exam reveals atrophic vaginal mucosa, radiation changes present.  No lesions on the cervix, no bleeding or discharge.  Bimanual exam reveals cervix smooth, flush with vaginal apex, no nodularity.  Rectovaginal exam  confirms findings.  Laboratory & Radiologic Studies: 05/07/23: NIML Pap, HR HPV negative  Assessment & Plan: Anne Harris is a 63 y.o. woman with history of Stage IIB SCC of the cervix who presents for surveillance visit. Definitive chemoradiation completed in 03/2020.   The patient is doing very well and is NED on exam today.     We will continue with yearly Pap tests, which she will be due for in 05/2024.    Per NCCN surveillance recommendations, we will transition to visits every 6 months alternating between my office and radiation oncology.  She sees Dr. Roselind Messier in June. I will plan to see her back in December.    We discussed signs and symptoms that would be concerning for disease recurrence and she knows to call if she  develops any of these.  Discussed with her options for plastic surgery referral.  I suspect that the area on her left forehead is a lipoma.  20 minutes of total time was spent for this patient encounter, including preparation, face-to-face counseling with the patient and coordination of care, and documentation of the encounter.  Eugene Garnet, MD  Division of Gynecologic Oncology  Department of Obstetrics and Gynecology  [facility] [address] [facility]

## 2023-07-21 ENCOUNTER — Other Ambulatory Visit (HOSPITAL_COMMUNITY): Payer: Self-pay

## 2023-08-17 ENCOUNTER — Institutional Professional Consult (permissible substitution): Payer: Medicaid Other | Admitting: Plastic Surgery

## 2023-09-10 ENCOUNTER — Institutional Professional Consult (permissible substitution): Admitting: Plastic Surgery

## 2023-09-12 ENCOUNTER — Other Ambulatory Visit: Payer: Self-pay | Admitting: Family Medicine

## 2023-09-12 DIAGNOSIS — I10 Essential (primary) hypertension: Secondary | ICD-10-CM

## 2023-09-14 ENCOUNTER — Other Ambulatory Visit (HOSPITAL_COMMUNITY): Payer: Self-pay

## 2023-09-14 ENCOUNTER — Other Ambulatory Visit (HOSPITAL_BASED_OUTPATIENT_CLINIC_OR_DEPARTMENT_OTHER): Payer: Self-pay

## 2023-09-14 MED ORDER — AMLODIPINE BESYLATE 10 MG PO TABS
10.0000 mg | ORAL_TABLET | Freq: Every day | ORAL | 0 refills | Status: DC
  Filled 2023-09-14: qty 30, 30d supply, fill #0

## 2023-09-22 ENCOUNTER — Telehealth: Payer: Self-pay | Admitting: Plastic Surgery

## 2023-09-22 NOTE — Telephone Encounter (Signed)
 Called and left a VM.

## 2023-10-06 ENCOUNTER — Institutional Professional Consult (permissible substitution): Admitting: Plastic Surgery

## 2023-10-12 ENCOUNTER — Institutional Professional Consult (permissible substitution): Admitting: Plastic Surgery

## 2023-10-15 ENCOUNTER — Other Ambulatory Visit (HOSPITAL_COMMUNITY): Payer: Self-pay

## 2023-10-15 ENCOUNTER — Other Ambulatory Visit: Payer: Self-pay | Admitting: Nurse Practitioner

## 2023-10-15 ENCOUNTER — Institutional Professional Consult (permissible substitution): Admitting: Plastic Surgery

## 2023-10-15 ENCOUNTER — Other Ambulatory Visit: Payer: Self-pay

## 2023-10-15 ENCOUNTER — Other Ambulatory Visit: Payer: Self-pay | Admitting: Family Medicine

## 2023-10-15 DIAGNOSIS — I10 Essential (primary) hypertension: Secondary | ICD-10-CM

## 2023-10-15 MED ORDER — LOSARTAN POTASSIUM 25 MG PO TABS
25.0000 mg | ORAL_TABLET | Freq: Every day | ORAL | 1 refills | Status: DC
  Filled 2023-10-15: qty 90, 90d supply, fill #0

## 2023-10-15 MED ORDER — AMLODIPINE BESYLATE 10 MG PO TABS
10.0000 mg | ORAL_TABLET | Freq: Every day | ORAL | 0 refills | Status: DC
  Filled 2023-10-15: qty 30, 30d supply, fill #0

## 2023-10-22 ENCOUNTER — Telehealth: Payer: Self-pay | Admitting: *Deleted

## 2023-10-22 NOTE — Telephone Encounter (Signed)
 Called patient to ask about rescheduling fu, due to Dr. Eloise Hake being off, lvm for a return call

## 2023-10-27 ENCOUNTER — Telehealth: Payer: Self-pay | Admitting: *Deleted

## 2023-10-27 NOTE — Telephone Encounter (Signed)
 RETURNED PATIENT'S PHONE CALL, SPOKE WITH PATIENT. ?

## 2023-11-05 ENCOUNTER — Ambulatory Visit: Payer: Self-pay | Admitting: Radiation Oncology

## 2023-11-14 NOTE — Progress Notes (Signed)
  Radiation Oncology         [phone] ________________________________  Name: Anne Harris MRN: [id number]  Date: 11/16/2023  DOB: [birth year]  Follow-Up Visit Note  CC: Collins Dean, NP  Collins Dean, NP  No diagnosis found.  Diagnosis: Stage IIB squamous cell carcinoma of the cervix    Interval Since Last Radiation:  3 years, 7 months, and 19 days   Radiation Treatment Dates: 12/28/2019 to 02/09/2020 followed by five HDR brachytherapy treatments from 02/27/2020 to 03/27/2020   Site: Cervix Technique: 3D Total Dose (Gy): 45/45 Dose per Fx (Gy): 1.8 Completed Fx: 25/25 Beam Energies: 6X, 10X, 15X   Site: Cervix boost Technique: 3D Total Dose (Gy): 9/9 Dose per Fx (Gy): 1.8 Completed Fx: 5/5 Beam Energies: 15X  Narrative:  The patient returns today for routine follow-up. She was last seen here for follow-up on 05/07/23.            Since her last visit, she most recently followed up with Dr. Orvil Bland on 07/17/23. She was noted to be doing very well and NED on examination at that time.       No other significant interval history since the patient was last seen for follow-up.   ***                 Allergies:  is allergic to nsaids.  Meds: Current Outpatient Medications  Medication Sig Dispense Refill   amLODipine  (NORVASC ) 10 MG tablet Take 1 tablet (10 mg total) by mouth daily. Must have office visit for refills. 30 tablet 0   losartan  (COZAAR ) 25 MG tablet Take 1 tablet (25 mg total) by mouth daily. 90 tablet 1   No current facility-administered medications for this encounter.    Physical Findings: The patient is in no acute distress. Patient is alert and oriented.  vitals were not taken for this visit. .  No significant changes. Lungs are clear to auscultation bilaterally. Heart has regular rate and rhythm. No palpable cervical, supraclavicular, or axillary adenopathy. Abdomen soft, non-tender, normal bowel sounds.  On pelvic examination the  external genitalia were unremarkable. A speculum exam was performed. There are no mucosal lesions noted in the vaginal vault. A Pap smear was obtained of the proximal vagina. On bimanual and rectovaginal examination there were no pelvic masses appreciated. ***   Lab Findings: Lab Results  Component Value Date   WBC 5.5 04/24/2023   HGB 10.7 (L) 04/24/2023   HCT 35.4 (L) 04/24/2023   MCV 82.7 04/24/2023   PLT 249 04/24/2023    Radiographic Findings: No results found.  Impression: Stage IIB squamous cell carcinoma of the cervix    The patient is recovering from the effects of radiation.  ***  Plan:  ***   *** minutes of total time was spent for this patient encounter, including preparation, face-to-face counseling with the patient and coordination of care, physical exam, and documentation of the encounter. ____________________________________  Noralee Beam, PhD, MD  This document serves as a record of services personally performed by Retta Caster, MD. It was created on his behalf by Aleta Anda, a trained medical scribe. The creation of this record is based on the scribe's personal observations and the provider's statements to them. This document has been checked and approved by the attending provider.

## 2023-11-15 ENCOUNTER — Other Ambulatory Visit: Payer: Self-pay | Admitting: Family Medicine

## 2023-11-15 DIAGNOSIS — I10 Essential (primary) hypertension: Secondary | ICD-10-CM

## 2023-11-16 ENCOUNTER — Encounter: Payer: Self-pay | Admitting: Radiation Oncology

## 2023-11-16 ENCOUNTER — Other Ambulatory Visit: Payer: Self-pay | Admitting: Family Medicine

## 2023-11-16 ENCOUNTER — Ambulatory Visit
Admission: RE | Admit: 2023-11-16 | Discharge: 2023-11-16 | Disposition: A | Discharge status: Home / Self Care | Source: Ambulatory Visit | Attending: Radiation Oncology | Admitting: Radiation Oncology

## 2023-11-16 VITALS — BP 185/111 | HR 87 | Temp 97.5°F | Resp 20 | Ht 65.0 in | Wt 280.4 lb

## 2023-11-16 DIAGNOSIS — I160 Hypertensive urgency: Secondary | ICD-10-CM | POA: Diagnosis not present

## 2023-11-16 DIAGNOSIS — Z923 Personal history of irradiation: Secondary | ICD-10-CM | POA: Diagnosis not present

## 2023-11-16 DIAGNOSIS — C539 Malignant neoplasm of cervix uteri, unspecified: Secondary | ICD-10-CM

## 2023-11-16 DIAGNOSIS — Z8541 Personal history of malignant neoplasm of cervix uteri: Secondary | ICD-10-CM | POA: Insufficient documentation

## 2023-11-16 DIAGNOSIS — Z79899 Other long term (current) drug therapy: Secondary | ICD-10-CM | POA: Diagnosis not present

## 2023-11-16 DIAGNOSIS — I10 Essential (primary) hypertension: Secondary | ICD-10-CM

## 2023-11-16 NOTE — Progress Notes (Signed)
 Anne Harris is here today for follow up post radiation to the pelvic.  They completed their radiation on: 02/09/20   Does the patient complain of any of the following:  Pain: No Abdominal bloating: No Diarrhea/Constipation: No Nausea/Vomiting: No Vaginal Discharge: No Blood in Urine or Stool: No Urinary Issues (dysuria/incomplete emptying/ incontinence/ increased frequency/urgency): No Does patient report using vaginal dilator 2-3 times a week and/or sexually active 2-3 weeks: yes,using vaginal dilators.  Post radiation skin changes: No   Additional comments if applicable:  Last pap smear- 05/07/23  Noted patients blood pressure to be elevated. 170/103,  HR-87 rechecked 185/111 HR 85. Patient reports taking both amlodipine  and lorstan this morning.Denies any headaches, chest pain, dizziness or other issues. Encouraged patient to follow up with PCP to make aware of elevated blood pressure. Patient voiced understanding.   BP (!) 170/103 (BP Location: Left Arm, Patient Position: Sitting, Cuff Size: Large)   Pulse 87   Temp (!) 97.5 F (36.4 C)   Resp 20   Ht 5' 5 (1.651 m)   Wt 280 lb 6.4 oz (127.2 kg)   SpO2 100%   BMI 46.66 kg/m

## 2023-11-19 ENCOUNTER — Other Ambulatory Visit (HOSPITAL_COMMUNITY): Payer: Self-pay

## 2023-11-19 ENCOUNTER — Other Ambulatory Visit: Payer: Self-pay | Admitting: Family Medicine

## 2023-11-19 DIAGNOSIS — I10 Essential (primary) hypertension: Secondary | ICD-10-CM

## 2023-11-20 ENCOUNTER — Other Ambulatory Visit: Payer: Self-pay | Admitting: Family Medicine

## 2023-11-20 DIAGNOSIS — I10 Essential (primary) hypertension: Secondary | ICD-10-CM

## 2023-11-21 ENCOUNTER — Other Ambulatory Visit (HOSPITAL_COMMUNITY): Payer: Self-pay

## 2023-11-23 ENCOUNTER — Other Ambulatory Visit: Payer: Self-pay | Admitting: Nurse Practitioner

## 2023-11-23 ENCOUNTER — Other Ambulatory Visit: Payer: Self-pay | Admitting: Family Medicine

## 2023-11-23 DIAGNOSIS — I10 Essential (primary) hypertension: Secondary | ICD-10-CM

## 2023-11-23 NOTE — Telephone Encounter (Unsigned)
 Copied from CRM [id number]. Topic: Clinical - Medication Refill >> Nov 23, 2023  4:03 PM Nathanel BROCKS wrote: Medication: amLODipine  (NORVASC ) 10 MG tablet   Has the patient contacted their pharmacy? Yes   This is the patient's preferred pharmacy:  [facility] [address], [address] [place] [address] Phone: [phone] Fax: [phone]  Is this the correct pharmacy for this prescription? Yes If no, delete pharmacy and type the correct one.   Has the prescription been filled recently? Yes  Is the patient out of the medication? Yes  Has the patient been seen for an appointment in the last year OR does the patient have an upcoming appointment? Yes  Can we respond through MyChart? Yes  Agent: Please be advised that Rx refills may take up to 3 business days. We ask that you follow-up with your pharmacy.

## 2023-11-24 ENCOUNTER — Other Ambulatory Visit: Payer: Self-pay | Admitting: Family Medicine

## 2023-11-24 DIAGNOSIS — I10 Essential (primary) hypertension: Secondary | ICD-10-CM

## 2023-11-25 ENCOUNTER — Telehealth: Payer: Self-pay | Admitting: Nurse Practitioner

## 2023-11-25 ENCOUNTER — Other Ambulatory Visit: Payer: Self-pay | Admitting: Nurse Practitioner

## 2023-11-25 ENCOUNTER — Other Ambulatory Visit (HOSPITAL_COMMUNITY): Payer: Self-pay

## 2023-11-25 DIAGNOSIS — I10 Essential (primary) hypertension: Secondary | ICD-10-CM

## 2023-11-25 MED ORDER — AMLODIPINE BESYLATE 10 MG PO TABS
10.0000 mg | ORAL_TABLET | Freq: Every day | ORAL | 1 refills | Status: DC
  Filled 2023-11-25 – 2023-11-26 (×2): qty 30, 30d supply, fill #0

## 2023-11-25 NOTE — Telephone Encounter (Signed)
 Amlodipine  sent to [place].

## 2023-11-25 NOTE — Telephone Encounter (Signed)
 Copied from CRM [id number]. Topic: Clinical - Medication Question >> Nov 25, 2023 11:45 AM Carlatta H wrote:  Reason for CRM: Patient would like to know if she could double up on her losartan  (COZAAR ) 25 MG tablet [[id number]] since she will be out of the other medication

## 2023-11-25 NOTE — Telephone Encounter (Signed)
 Requested medication (s) are due for refill today: yes  Requested medication (s) are on the active medication list: yes  Last refill:  10/15/23 #30  Future visit scheduled: no  Notes to clinic:  pt is active in MyChart. Sent pt message to cal and make appt. Advised pt that the reason her med has been refused is because she needs appt. Pt has not been seen since 11/24. Pt has already been given two 30 day courtesy refills.    Requested Prescriptions  Pending Prescriptions Disp Refills   amLODipine  (NORVASC ) 10 MG tablet 30 tablet 0    Sig: Take 1 tablet (10 mg total) by mouth daily. Must have office visit for refills.     Cardiovascular: Calcium Channel Blockers 2 Failed - 11/25/2023 11:39 AM      Failed - Last BP in normal range    BP Readings from Last 1 Encounters:  11/16/23 (!) 185/111         Failed - Valid encounter within last 6 months    Recent Outpatient Visits           7 months ago Primary hypertension   [facility] [place]. [facility] Theotis Haze ORN, NP   1 year ago Annual physical exam   [facility] [place]. [facility] Vicci Barnie NOVAK, MD   1 year ago Prediabetes   [facility] [place]. [facility] [place], Jon HERO, [place]   2 years ago Primary hypertension   [facility] [place]. [facility] Theotis Haze ORN, NP   2 years ago Acute pain of right knee   [facility] [place]. [facility] [place], [place] W, NP              Passed - Last Heart Rate in normal range    Pulse Readings from Last 1 Encounters:  11/16/23 87

## 2023-11-25 NOTE — Telephone Encounter (Signed)
Unable to reach patient by phone to relay results.   

## 2023-11-26 ENCOUNTER — Other Ambulatory Visit (HOSPITAL_COMMUNITY): Payer: Self-pay

## 2023-11-26 ENCOUNTER — Other Ambulatory Visit: Payer: Self-pay

## 2023-12-22 ENCOUNTER — Telehealth: Payer: Self-pay | Admitting: Nurse Practitioner

## 2023-12-22 NOTE — Telephone Encounter (Signed)
 Pt confirmed appt

## 2023-12-23 ENCOUNTER — Other Ambulatory Visit: Payer: Self-pay

## 2023-12-23 ENCOUNTER — Ambulatory Visit: Attending: Nurse Practitioner | Admitting: Nurse Practitioner

## 2023-12-23 ENCOUNTER — Other Ambulatory Visit (HOSPITAL_COMMUNITY): Payer: Self-pay

## 2023-12-23 ENCOUNTER — Encounter: Payer: Self-pay | Admitting: Nurse Practitioner

## 2023-12-23 VITALS — BP 141/85 | HR 77 | Resp 19 | Ht 65.0 in | Wt 281.0 lb

## 2023-12-23 DIAGNOSIS — Z1211 Encounter for screening for malignant neoplasm of colon: Secondary | ICD-10-CM

## 2023-12-23 DIAGNOSIS — Z1231 Encounter for screening mammogram for malignant neoplasm of breast: Secondary | ICD-10-CM

## 2023-12-23 DIAGNOSIS — I10 Essential (primary) hypertension: Secondary | ICD-10-CM

## 2023-12-23 MED ORDER — AMLODIPINE BESYLATE 10 MG PO TABS
10.0000 mg | ORAL_TABLET | Freq: Every day | ORAL | 1 refills | Status: DC
  Filled 2023-12-23 (×2): qty 90, 90d supply, fill #0

## 2023-12-23 MED ORDER — AMLODIPINE BESYLATE 10 MG PO TABS
10.0000 mg | ORAL_TABLET | Freq: Every day | ORAL | 1 refills | Status: DC
  Filled 2023-12-23 (×2): qty 90, 90d supply, fill #0
  Filled 2024-03-21: qty 90, 90d supply, fill #1

## 2023-12-23 MED ORDER — LOSARTAN POTASSIUM 100 MG PO TABS
100.0000 mg | ORAL_TABLET | Freq: Every day | ORAL | 1 refills | Status: DC
  Filled 2023-12-23 – 2023-12-25 (×2): qty 90, 90d supply, fill #0
  Filled 2024-03-21: qty 90, 90d supply, fill #1

## 2023-12-23 NOTE — Progress Notes (Signed)
 Assessment & Plan:  Anne Harris was seen today for hypertension.  Diagnoses and all orders for this visit:  Primary hypertension Dose change: Increase losartan  from 25 mg to 100 mg. -     amLODipine  (NORVASC ) 10 MG tablet; Take 1 tablet (10 mg total) by mouth daily. -     losartan  (COZAAR ) 100 MG tablet; Take 1 tablet (100 mg total) by mouth daily. Continue all antihypertensives as prescribed.  Reminded to bring in blood pressure log for follow  up appointment.  RECOMMENDATIONS: DASH/Mediterranean Diets are healthier choices for HTN.    Encounter for screening mammogram for malignant neoplasm of breast -     MM 3D SCREENING MAMMOGRAM BILATERAL BREAST; Future  Screening for colon cancer -     Ambulatory referral to Gastroenterology   Patient has been counseled on age-appropriate routine health concerns for screening and prevention. These are reviewed and up-to-date. Referrals have been placed accordingly. Immunizations are up-to-date or declined.    Subjective:   Chief Complaint  Patient presents with   Hypertension    Anne Harris 63 y.o. female presents to office today for follow up to HTN  She has a past medical history of anemia, cervical cancer, hypertension, mediastinal adenopathy, morbid obesity, pancytopenia, bilateral pulmonary nodules.   She is currently overdue for mammogram.  We were able to get this scheduled for her today and she is aware of her upcoming appointment.   Blood pressure is elevated despite taking amlodipine  10 mg and losartan  25 mg daily as prescribed.  She reports similar readings at home. BP Readings from Last 3 Encounters:  12/23/23 (!) 141/85  11/16/23 (!) 185/111  07/17/23 (!) 141/77     Review of Systems  Constitutional:  Negative for fever, malaise/fatigue and weight loss.  HENT: Negative.  Negative for nosebleeds.   Eyes: Negative.  Negative for blurred vision, double vision and photophobia.  Respiratory: Negative.  Negative for  cough and shortness of breath.   Cardiovascular: Negative.  Negative for chest pain, palpitations and leg swelling.  Gastrointestinal: Negative.  Negative for heartburn, nausea and vomiting.  Musculoskeletal: Negative.  Negative for myalgias.  Neurological: Negative.  Negative for dizziness, focal weakness, seizures and headaches.  Psychiatric/Behavioral: Negative.  Negative for suicidal ideas.     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 shannon   History of radiation therapy 12/28/2019-02/09/2020   Cervix   Dr Lynwood shannon   Hypertension    followed by pcp   Mediastinal adenopathy    Obesity, Class III, BMI 40-49.9 (morbid obesity)    Pancytopenia, acquired (HCC)    Pulmonary nodules    bilateral   Wears glasses     Past Surgical History:  Procedure Laterality Date   BRONCHIAL BIOPSY  12/15/2019   Procedure: BRONCHIAL BIOPSIES;  Surgeon: Brenna Adine CROME, DO;  Location: [facility];  Service: Pulmonary;;   BRONCHIAL NEEDLE ASPIRATION BIOPSY  12/15/2019   Procedure: BRONCHIAL NEEDLE ASPIRATION BIOPSIES;  Surgeon: Brenna Adine CROME, DO;  Location: [facility];  Service: Pulmonary;;   BRONCHIAL WASHINGS  12/15/2019   Procedure: BRONCHIAL WASHINGS;  Surgeon: Brenna Adine CROME, DO;  Location: [facility];  Service: Pulmonary;;   CERVICAL BIOPSY     ENDOBRONCHIAL ULTRASOUND N/A 12/15/2019   Procedure: ENDOBRONCHIAL ULTRASOUND;  Surgeon: Brenna Adine CROME, DO;  Location: [facility];  Service: Pulmonary;  Laterality: N/A;   IR IMAGING GUIDED PORT INSERTION  12/22/2019   IR REMOVAL TUN ACCESS W/ PORT W/O FL MOD SED  06/14/2020   MOUTH SURGERY  04/02/2023    OPERATIVE ULTRASOUND N/A 02/27/2020   Procedure: OPERATIVE ULTRASOUND;  Surgeon: Shannon Agent, MD;  Location: [facility];  Service: Urology;  Laterality: N/A;   OPERATIVE ULTRASOUND N/A 03/05/2020   Procedure: OPERATIVE ULTRASOUND;  Surgeon: Shannon Agent, MD;  Location: [facility];  Service: Urology;  Laterality: N/A;   OPERATIVE ULTRASOUND N/A 03/14/2020   Procedure: OPERATIVE ULTRASOUND;  Surgeon: Shannon Agent, MD;  Location[facility];  Service: Urology;  Laterality: N/A;   OPERATIVE ULTRASOUND N/A 03/23/2020   Procedure: OPERATIVE ULTRASOUND;  Surgeon: Shannon Agent, MD;  Location: [facility];  Service: Urology;  Laterality: N/A;   OPERATIVE ULTRASOUND N/A 03/27/2020   Procedure: OPERATIVE ULTRASOUND;  Surgeon: Shannon Agent, MD;  Location: [facility];  Service: Urology;  Laterality: N/A;   TANDEM RING INSERTION N/A 02/27/2020   Procedure: TANDEM RING INSERTION;  Surgeon: Shannon Agent, MD;  Location: [facility];  Service: Urology;  Laterality: N/A;   TANDEM RING INSERTION N/A 03/05/2020   Procedure: TANDEM RING INSERTION;  Surgeon: Shannon Agent, MD;  Location: [facility];  Service: Urology;  Laterality: N/A;   TANDEM RING INSERTION N/A 03/14/2020   Procedure: TANDEM RING INSERTION FOR HIGH DOSE RADIATION THERAPY;  Surgeon: Shannon Agent, MD;  Location: [facility];  Service: Urology;  Laterality: N/A;   TANDEM RING INSERTION N/A 03/23/2020   Procedure: TANDEM RING INSERTION;  Surgeon: Shannon Agent, MD;  Location: [facility];  Service: Urology;  Laterality: N/A;   TANDEM RING INSERTION N/A 03/27/2020   Procedure: TANDEM RING INSERTION;  Surgeon: Shannon Agent, MD;  Location: [facility];  Service: Urology;  Laterality: N/A;  ULTRASOUND AND TECH    Family History  Problem Relation Age of Onset   Colon cancer Mother 49   Ovarian  cancer Neg Hx    Uterine cancer Neg Hx    Breast cancer Neg Hx    Cervical cancer Neg Hx     Social History Reviewed with no changes to be made today.   Outpatient Medications Prior to Visit  Medication Sig Dispense Refill   amLODipine  (NORVASC ) 10 MG tablet Take 1 tablet (10 mg total) by mouth daily. Must have office visit for refills. 30 tablet 1   losartan  (COZAAR ) 25 MG tablet Take 1 tablet (25 mg total) by mouth daily. 90 tablet 1   No facility-administered medications prior to visit.    Allergies  Allergen Reactions   Nsaids     Not recommended per oncology due to HTN and potential for ARI, pt states this is not a current issue  Not recommended per oncology due to HTN and potential for ARI    Not recommended per oncology due to HTN and potential for ARI, pt states this is not a current issue       Objective:    BP (!) 141/85 (BP Location: Left Arm, Patient Position: Sitting, Cuff Size: Large)   Pulse 77   Resp 19   Ht 5' 5 (1.651 m)   Wt 281 lb (127.5 kg)  SpO2 98%   BMI 46.76 kg/m  Wt Readings from Last 3 Encounters:  12/23/23 281 lb (127.5 kg)  11/16/23 280 lb 6.4 oz (127.2 kg)  07/17/23 267 lb (121.1 kg)    Physical Exam Vitals and nursing note reviewed.  Constitutional:      Appearance: She is well-developed.  HENT:     Head: Normocephalic and atraumatic.  Cardiovascular:     Rate and Rhythm: Normal rate and regular rhythm.     Heart sounds: Normal heart sounds. No murmur heard.    No friction rub. No gallop.  Pulmonary:     Effort: Pulmonary effort is normal. No tachypnea or respiratory distress.     Breath sounds: Normal breath sounds. No decreased breath sounds, wheezing, rhonchi or rales.  Chest:     Chest wall: No tenderness.  Abdominal:     General: Bowel sounds are normal.     Palpations: Abdomen is soft.  Musculoskeletal:        General: Normal range of motion.     Cervical back: Normal range of motion.  Skin:    General: Skin is  warm and dry.  Neurological:     Mental Status: She is alert and oriented to person, place, and time.     Coordination: Coordination normal.  Psychiatric:        Behavior: Behavior normal. Behavior is cooperative.        Thought Content: Thought content normal.        Judgment: Judgment normal.          Patient has been counseled extensively about nutrition and exercise as well as the importance of adherence with medications and regular follow-up. The patient was given clear instructions to go to ER or return to medical center if symptoms don't improve, worsen or new problems develop. The patient verbalized understanding.   Follow-up: Return in about 4 weeks (around 01/20/2024) for BP CHECK WITH LUKE or me.   Haze LELON Servant, FNP-BC [facility] [place], [place] [phone]   12/23/2023, 4:56 PM

## 2023-12-23 NOTE — Patient Instructions (Addendum)
 Anne Harris [address] [place], [place] [address] ?PH# [phone] ?

## 2023-12-25 ENCOUNTER — Other Ambulatory Visit (HOSPITAL_COMMUNITY): Payer: Self-pay

## 2023-12-26 ENCOUNTER — Other Ambulatory Visit (HOSPITAL_COMMUNITY): Payer: Self-pay

## 2024-01-06 ENCOUNTER — Encounter: Payer: Self-pay | Admitting: Gynecologic Oncology

## 2024-01-15 ENCOUNTER — Ambulatory Visit
Admission: RE | Admit: 2024-01-15 | Discharge: 2024-01-15 | Disposition: A | Discharge status: Home / Self Care | Source: Ambulatory Visit | Attending: Nurse Practitioner | Admitting: Nurse Practitioner

## 2024-01-15 DIAGNOSIS — Z1231 Encounter for screening mammogram for malignant neoplasm of breast: Secondary | ICD-10-CM

## 2024-01-20 ENCOUNTER — Telehealth: Payer: Self-pay | Admitting: Nurse Practitioner

## 2024-01-20 NOTE — Telephone Encounter (Signed)
 Confirmed appt for 8/21

## 2024-01-21 ENCOUNTER — Ambulatory Visit: Attending: Family Medicine | Admitting: Pharmacist

## 2024-01-21 ENCOUNTER — Encounter: Payer: Self-pay | Admitting: Pharmacist

## 2024-01-21 VITALS — BP 131/80

## 2024-01-21 DIAGNOSIS — I10 Essential (primary) hypertension: Secondary | ICD-10-CM | POA: Diagnosis not present

## 2024-01-21 NOTE — Progress Notes (Signed)
 S:     No chief complaint on file.  63 y.o. female who presents for hypertension evaluation, education, and management.   Patient was referred and last seen by Primary Care Provider, Haze Servant, on 12/23/2023. At that visit, BP was 141/85 mmHg. This was despite taking amlodipine  and losartan  as prescribed. She also endorsed similar home readings. Therefore, her losartan  dose was increased to 100 mg daily  PMH is significant for anemia, cervical cancer, hypertension, mediastinal adenopathy, morbid obesity, pancytopenia, bilateral pulmonary nodules.    Today, patient arrives in good spirits and presents without assistance. Denies dizziness, headache, blurred vision, swelling.   Patient reports hypertension is longstanding.   Family/Social history:  -Fhx: colon cancer  -Tobacco: never smoker  -Alcohol: none reported  Medication adherence reported. Patient reports taking blood pressure medications today.   Current antihypertensives include: amlodipine  10 mg daily, losartan  100 mg daily  Antihypertensives tried in the past include: hydrochlorothiazide  (hypokalemia), lisinopril , losartan  at higher doses   Reported home blood pressure readings: 140s/80s  Patient reported dietary habits: -Denies any excessive intake of caffeine  -Does not drink   Patient-reported exercise habits: none reported   O:  Vitals:   01/21/24 1006  BP: 131/80   Last 3 Office BP readings: BP Readings from Last 3 Encounters:  01/21/24 131/80  12/23/23 (!) 141/85  11/16/23 (!) 185/111   BMET    Component Value Date/Time   NA 142 04/24/2023 1017   NA 142 09/08/2022 0949   K 3.4 (L) 04/24/2023 1017   CL 107 04/24/2023 1017   CO2 26 04/24/2023 1017   GLUCOSE 96 04/24/2023 1017   BUN 5 (L) 04/24/2023 1017   BUN 8 09/08/2022 0949   CREATININE 0.86 04/24/2023 1017   CREATININE 0.73 04/05/2020 1245   CALCIUM 8.8 (L) 04/24/2023 1017   GFRNONAA >60 04/24/2023 1017   GFRNONAA >60 04/05/2020 1245    GFRAA >60 02/27/2020 0829   GFRAA >60 01/31/2020 0945   Renal function: CrCl cannot be calculated (Patient's most recent lab result is older than the maximum 21 days allowed.).  Clinical ASCVD: No  The 10-year ASCVD risk score (Arnett DK, et al., 2019) is: 8%   Values used to calculate the score:     Age: 34 years     Clincally relevant sex: Female     Is Non-Hispanic African American: Yes     Diabetic: No     Tobacco smoker: No     Systolic Blood Pressure: 131 mmHg     Is BP treated: Yes     HDL Cholesterol: 58 mg/dL     Total Cholesterol: 176 mg/dL  Patient is participating in a Managed Medicaid Plan: No    A/P: Hypertension diagnosed currently at goal on current medications. BP goal < 130/80 mmHg. Medication adherence appears to be optimal. -Continued amlodipine  10 mg daily.  -Continued losartan  100 mg daily.  -Patient educated on purpose, proper use, and potential adverse effects of amlodipine , losartan .  -F/u labs ordered - none today -Counseled on lifestyle modifications for blood pressure control including reduced dietary sodium, increased exercise, adequate sleep. -Encouraged patient to check BP at home and bring log of readings to next visit. Counseled on proper use of home BP cuff.   Results reviewed and written information provided.    Written patient instructions provided. Patient verbalized understanding of treatment plan. Total time in face to face counseling 30 minutes.    Follow-up:  Pharmacist in 1 month.  Herlene Salinas Ausdall,  PharmD, JAQUELINE, CPP Clinical Pharmacist [facility] [phone]

## 2024-03-03 ENCOUNTER — Ambulatory Visit: Admitting: Pharmacist

## 2024-03-21 ENCOUNTER — Other Ambulatory Visit (HOSPITAL_COMMUNITY): Payer: Self-pay

## 2024-03-22 ENCOUNTER — Ambulatory Visit: Admitting: Nurse Practitioner

## 2024-04-12 ENCOUNTER — Encounter: Payer: Self-pay | Admitting: Gynecologic Oncology

## 2024-04-15 ENCOUNTER — Encounter: Payer: Self-pay | Admitting: Gynecologic Oncology

## 2024-04-15 ENCOUNTER — Inpatient Hospital Stay: Payer: Medicaid Other | Attending: Gynecologic Oncology | Admitting: Gynecologic Oncology

## 2024-04-15 VITALS — BP 148/88 | HR 100 | Temp 98.2°F | Resp 20 | Wt 280.2 lb

## 2024-04-15 DIAGNOSIS — Z8541 Personal history of malignant neoplasm of cervix uteri: Secondary | ICD-10-CM | POA: Insufficient documentation

## 2024-04-15 DIAGNOSIS — Z923 Personal history of irradiation: Secondary | ICD-10-CM | POA: Insufficient documentation

## 2024-04-15 DIAGNOSIS — C539 Malignant neoplasm of cervix uteri, unspecified: Secondary | ICD-10-CM

## 2024-04-15 DIAGNOSIS — Z9221 Personal history of antineoplastic chemotherapy: Secondary | ICD-10-CM | POA: Insufficient documentation

## 2024-04-15 NOTE — Patient Instructions (Signed)
 It was good to see you today.  I do not see or feel any evidence of cancer recurrence on your exam.  We will see you for follow-up in 12 months.  As always, if you develop any new and concerning symptoms before your next visit, please call to see me sooner.

## 2024-04-15 NOTE — Progress Notes (Signed)
 Gynecologic Oncology Return Clinic Visit  04/15/24  Reason for Visit: Surveillance in the setting of locally advanced cervix cancer   Treatment History: Oncology History  Squamous cell carcinoma of cervix (HCC)  10/21/2019 Initial Diagnosis   The patient presented to the emergency department on 5/25 and again on 6/3 for abdominal pain and postmenopausal bleeding that started 5/21.  On 6/10 she underwent Pap smear.  Last Pap smear had been 30 years prior and normal per patient.     11/21/2019 Pathology Results   A. CERVIX, BIOPSY:  -  Squamous cell carcinoma    11/29/2019 PET scan   1. Highly hypermetabolic cervical mass, with fluid distended endometrial cavity. Maximum SUV of the cervical mass is 25.8. 2. Left adnexal lesion versus more likely fundal fibroid, maximum SUV 6.0. Surveillance suggested. 3. There is low-grade activity in several abdominal lymph nodes including a borderline enlarged peripancreatic lymph node. However, there is striking activity in numerous enlarged thoracic lymph nodes, some of which have associated calcifications. It would be unusual to have this degree of thoracic adenopathy from cervical cancer without having a commensurate level of abdominopelvic adenopathy. This along with the calcifications of the lymph nodes raises the possibility of active granulomatous process (such as sarcoidosis) as a potential cause for the striking thoracic adenopathy. Concomitant lymphoma might also have a similar appearance. Sampling of thoracic lymph nodes may be helpful in differentiating metastatic disease other entities such as sarcoid or Lymphoma.  4. Mild pleural/subpleural nodularity in the lungs, merit surveillance. 5. Cholelithiasis.   11/30/2019 Cancer Staging   Staging form: Cervix Uteri, AJCC Version 9 - Clinical stage from 11/30/2019: FIGO Stage IIB (cT2b, cN0, cM0) - Signed by Lonn Hicks, MD on 11/30/2019   12/02/2019 Imaging   Ct abdomen and pelvis 1. Cervical soft  tissue mass as described in keeping with known malignancy. 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 2. Mildly rounded subcentimeter aortocaval and right pelvic sidewall lymph nodes. 3. A 5.3 x 3.9 cm left fundal exophytic soft tissue suboptimally characterized but possibly a fibroid. 4. Cholelithiasis. 5. No bowel obstruction. Normal appendix. 6. Small bilateral lung base subpleural nodules similar to prior PET CT. Clinical correlation and follow-up recommended.   12/22/2019 Procedure   Successful placement of a right internal jugular approach power injectable Port-A-Cath. The catheter is ready for immediate use.   12/28/2019 - 03/27/2020 Radiation Therapy   12/28/2019 to 02/09/2020 followed by five HDR brachytherapy treatments from 02/27/2020 to 03/27/2020   Site: Cervix Technique: 3D Total Dose (Gy): 45/45 Dose per Fx (Gy): 1.8 Completed Fx: 25/25 Beam Energies: 6X, 10X, 15X   Site: Cervix boost Technique: 3D Total Dose (Gy): 9/9 Dose per Fx (Gy): 1.8 Completed Fx: 5/5 Beam Energies: 15X   12/29/2019 - 01/27/2020 Chemotherapy   The patient had cisplatin  for chemotherapy treatment.     05/22/2020 PET scan   1. Compared with the previous PET-CT of 6 months ago, the large hypermetabolic cervical mass has resolved consistent with response to therapy. 2. The previously demonstrated partially calcified mediastinal and hilar adenopathy has mildly improved. 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 3. Stable incidental findings including a probable exophytic fundal fibroid, cholelithiasis and periodontal disease.    Radiation Therapy   Pelvic radiation and HDR brachytherapy   06/15/2020 Procedure   Successful right IJ vein Port-A-Cath explant.     Interval History: Doing  well.  Denies any  vaginal bleeding or pelvic pain.  Reports baseline bowel and bladder function.  Past Medical/Surgical History: Past Medical History:  Diagnosis Date   Abnormal Pap smear of cervix    Anemia    Cervical cancer [facility]) oncologist-- dr lonn and dr shannon   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 Shannon   History of radiation therapy 12/28/2019-02/09/2020   Cervix   Dr Lynwood Shannon   Hypertension    followed by pcp   Mediastinal adenopathy    Obesity, Class III, BMI 40-49.9 (morbid obesity) (HCC)    Pancytopenia, acquired (HCC)    Pulmonary nodules    bilateral   Wears glasses     Past Surgical History:  Procedure Laterality Date   BRONCHIAL BIOPSY  12/15/2019   Procedure: BRONCHIAL BIOPSIES;  Surgeon: Brenna Adine CROME, DO;  Location: [facility];  Service: Pulmonary;;   BRONCHIAL NEEDLE ASPIRATION BIOPSY  12/15/2019   Procedure: BRONCHIAL NEEDLE ASPIRATION BIOPSIES;  Surgeon: Brenna Adine CROME, DO;  Location: [facility];  Service: Pulmonary;;   BRONCHIAL WASHINGS  12/15/2019   Procedure: BRONCHIAL WASHINGS;  Surgeon: Brenna Adine CROME, DO;  Location: [facility];  Service: Pulmonary;;   CERVICAL BIOPSY     ENDOBRONCHIAL ULTRASOUND N/A 12/15/2019   Procedure: ENDOBRONCHIAL ULTRASOUND;  Surgeon: Brenna Adine CROME, DO;  Location: [facility];  Service: Pulmonary;  Laterality: N/A;   IR IMAGING GUIDED PORT INSERTION  12/22/2019   IR REMOVAL TUN ACCESS W/ PORT W/O FL MOD SED  06/14/2020   MOUTH SURGERY  04/02/2023   OPERATIVE ULTRASOUND N/A 02/27/2020   Procedure: OPERATIVE ULTRASOUND;  Surgeon: Shannon Lynwood, MD;  Location: [facility];  Service: Urology;  Laterality: N/A;   OPERATIVE ULTRASOUND N/A 03/05/2020   Procedure: OPERATIVE ULTRASOUND;  Surgeon: Shannon Lynwood, MD;  Location:  [facility];  Service: Urology;  Laterality: N/A;   OPERATIVE ULTRASOUND N/A 03/14/2020   Procedure: OPERATIVE ULTRASOUND;  Surgeon: Shannon Lynwood, MD;  Location: [facility];  Service: Urology;  Laterality: N/A;   OPERATIVE ULTRASOUND N/A 03/23/2020   Procedure: OPERATIVE ULTRASOUND;  Surgeon: Shannon Lynwood, MD;  Location: [facility];  Service: Urology;  Laterality: N/A;   OPERATIVE ULTRASOUND N/A 03/27/2020   Procedure: OPERATIVE ULTRASOUND;  Surgeon: Shannon Lynwood, MD;  Location: [facility];  Service: Urology;  Laterality: N/A;   TANDEM RING INSERTION N/A 02/27/2020   Procedure: TANDEM RING INSERTION;  Surgeon: Shannon Lynwood, MD;  Location: [facility];  Service: Urology;  Laterality: N/A;   TANDEM RING INSERTION N/A 03/05/2020   Procedure: TANDEM RING INSERTION;  Surgeon: Shannon Lynwood, MD;  Location: [facility];  Service: Urology;  Laterality: N/A;   TANDEM RING INSERTION N/A 03/14/2020   Procedure: TANDEM RING INSERTION FOR HIGH DOSE RADIATION THERAPY;  Surgeon: Shannon Lynwood, MD;  Location: [facility];  Service: Urology;  Laterality: N/A;   TANDEM RING INSERTION N/A 03/23/2020   Procedure: TANDEM RING INSERTION;  Surgeon: Shannon Lynwood, MD;  Location: [facility];  Service: Urology;  Laterality: N/A;   TANDEM RING INSERTION N/A 03/27/2020   Procedure: TANDEM RING INSERTION;  Surgeon: Shannon Lynwood, MD;  Location: [facility];  Service: Urology;  Laterality: N/A;  ULTRASOUND AND TECH    Family  History  Problem Relation Age of Onset   Colon cancer Mother 51   Ovarian cancer Neg Hx    Uterine cancer Neg Hx    Breast cancer Neg Hx    Cervical cancer Neg Hx     Social History   Socioeconomic History   Marital status: Single    Spouse name: Not on file   Number of children: 6   Years of education: Not on file   Highest education level: High  school graduate  Occupational History   Occupation: care giver  Tobacco Use   Smoking status: Never   Smokeless tobacco: Never  Vaping Use   Vaping status: Never Used  Substance and Sexual Activity   Alcohol use: Yes    Comment: social   Drug use: Never   Sexual activity: Not Currently  Other Topics Concern   Not on file  Social History Narrative   Not on file   Social Drivers of Health   Financial Resource Strain: Low Risk  (12/23/2023)   Overall Financial Resource Strain (CARDIA)    Difficulty of Paying Living Expenses: Not hard at all  Food Insecurity: No Food Insecurity (12/23/2023)   Hunger Vital Sign    Worried About Running Out of Food in the Last Year: Never true    Ran Out of Food in the Last Year: Never true  Transportation Needs: No Transportation Needs (12/23/2023)   PRAPARE - Administrator, Civil Service (Medical): No    Lack of Transportation (Non-Medical): No  Physical Activity: Inactive (12/23/2023)   Exercise Vital Sign    Days of Exercise per Week: 0 days    Minutes of Exercise per Session: Not on file  Stress: No Stress Concern Present (12/23/2023)   [employer] of Occupational Health - Occupational Stress Questionnaire    Feeling of Stress: Not at all  Social Connections: Unknown (12/23/2023)   Social Connection and Isolation Panel    Frequency of Communication with Friends and Family: More than three times a week    Frequency of Social Gatherings with Friends and Family: Once a week    Attends Religious Services: 1 to 4 times per year    Active Member of [employer] or Organizations: Yes    Attends Banker Meetings: 1 to 4 times per year    Marital Status: Patient declined    Current Medications:  Current Outpatient Medications:    amLODipine  (NORVASC ) 10 MG tablet, Take 1 tablet (10 mg total) by mouth daily., Disp: 90 tablet, Rfl: 1   losartan  (COZAAR ) 100 MG tablet, Take 1 tablet (100 mg total) by mouth daily., Disp: 90  tablet, Rfl: 1  Review of Systems: Denies appetite changes, fevers, chills, fatigue, unexplained weight changes. Denies hearing loss, neck lumps or masses, mouth sores, ringing in ears or voice changes. Denies cough or wheezing.  Denies shortness of breath. Denies chest pain or palpitations. Denies leg swelling. Denies abdominal distention, pain, blood in stools, constipation, diarrhea, nausea, vomiting, or early satiety. Denies pain with intercourse, dysuria, frequency, hematuria or incontinence. Denies hot flashes, pelvic pain, vaginal bleeding or vaginal discharge.   Denies joint pain, back pain or muscle pain/cramps. Denies itching, rash, or wounds. Denies dizziness, headaches, numbness or seizures. Denies swollen lymph nodes or glands, denies easy bruising or bleeding. Denies anxiety, depression, confusion, or decreased concentration.  Physical Exam: BP (!) 160/75 (BP Location: Left Arm, Patient Position: Sitting)   Pulse 100   Temp 98.2 F (36.8 C) (Oral)  Resp 20   Wt 280 lb 3.2 oz (127.1 kg)   SpO2 94%   BMI 46.63 kg/m  General: Alert, oriented, no acute distress. HEENT: Normocephalic, atraumatic, sclera anicteric.  Small, round, smooth 1 cm lump just above the left eyebrow, skin covering is normal. Chest: Clear to auscultation bilaterally.  No wheezes or rhonchi. Cardiovascular: Regular rate and rhythm, no murmurs. Abdomen: Obese, soft, nontender.  Normoactive bowel sounds.  No masses or hepatosplenomegaly appreciated.   Extremities: Grossly normal range of motion.  Warm, well perfused.  No edema bilaterally. Skin: No rashes or lesions noted. Lymphatics: No cervical, supraclavicular, or inguinal adenopathy. GU: Normal appearing external genitalia without erythema, excoriation, or lesions.  Speculum exam reveals atrophic vaginal mucosa, radiation changes present.  No lesions on the cervix, no bleeding or discharge.  Pap and HPV collected.  Bimanual exam reveals cervix  smooth, flush with vaginal apex, no nodularity.  Rectovaginal exam confirms findings.  Laboratory & Radiologic Studies: None new  Assessment & Plan: HANNAH STRADER is a 63 y.o. woman with history of Stage IIB SCC of the cervix who presents for surveillance visit. Definitive chemoradiation completed in 03/2020. Last pap 05/2023 - NILM, HR HPV negative.   The patient is doing very well and is NED on exam today.     We will continue with yearly Pap tests, which she is due for today.    Per NCCN surveillance recommendations, we will continue with visits every 6 months alternating between my office and radiation oncology.  She sees Dr. Shannon in June. We will plan to see her back in December.     We discussed signs and symptoms that would be concerning for disease recurrence and she knows to call if she develops any of these.   22 minutes of total time was spent for this patient encounter, including preparation, face-to-face counseling with the patient and coordination of care, and documentation of the encounter.  Comer Dollar, MD  Division of Gynecologic Oncology  Department of Obstetrics and Gynecology  [facility]

## 2024-04-21 ENCOUNTER — Ambulatory Visit: Payer: Self-pay | Admitting: Gynecologic Oncology

## 2024-04-21 LAB — CYTOLOGY - PAP
Comment: NEGATIVE
Diagnosis: NEGATIVE
Diagnosis: REACTIVE
High risk HPV: NEGATIVE

## 2024-05-13 ENCOUNTER — Ambulatory Visit: Payer: Medicaid Other | Admitting: Gynecologic Oncology

## 2024-06-17 ENCOUNTER — Other Ambulatory Visit: Payer: Self-pay | Admitting: Nurse Practitioner

## 2024-06-17 ENCOUNTER — Other Ambulatory Visit: Payer: Self-pay

## 2024-06-17 ENCOUNTER — Other Ambulatory Visit (HOSPITAL_COMMUNITY): Payer: Self-pay

## 2024-06-17 DIAGNOSIS — I10 Essential (primary) hypertension: Secondary | ICD-10-CM

## 2024-06-17 MED ORDER — LOSARTAN POTASSIUM 100 MG PO TABS
100.0000 mg | ORAL_TABLET | Freq: Every day | ORAL | 0 refills | Status: AC
  Filled 2024-06-17: qty 30, 30d supply, fill #0

## 2024-06-17 MED ORDER — AMLODIPINE BESYLATE 10 MG PO TABS
10.0000 mg | ORAL_TABLET | Freq: Every day | ORAL | 0 refills | Status: AC
  Filled 2024-06-17: qty 30, 30d supply, fill #0

## 2024-11-17 ENCOUNTER — Ambulatory Visit: Payer: Self-pay | Admitting: Radiation Oncology

## 2025-04-18 ENCOUNTER — Inpatient Hospital Stay: Admitting: Gynecologic Oncology
